# Patient Record
Sex: Female | Born: 1975 | Race: Black or African American | Hispanic: No | Marital: Single | Smoking: Former smoker
Health system: Southern US, Community
[De-identification: ages and names within clinical notes are randomized; demographics above are authoritative.]

## PROBLEM LIST (undated history)

## (undated) ENCOUNTER — Inpatient Hospital Stay (HOSPITAL_COMMUNITY): Payer: Self-pay

## (undated) DIAGNOSIS — IMO0002 Reserved for concepts with insufficient information to code with codable children: Secondary | ICD-10-CM

## (undated) DIAGNOSIS — Z789 Other specified health status: Secondary | ICD-10-CM

## (undated) DIAGNOSIS — R87629 Unspecified abnormal cytological findings in specimens from vagina: Secondary | ICD-10-CM

## (undated) DIAGNOSIS — N390 Urinary tract infection, site not specified: Secondary | ICD-10-CM

## (undated) DIAGNOSIS — R87619 Unspecified abnormal cytological findings in specimens from cervix uteri: Secondary | ICD-10-CM

## (undated) DIAGNOSIS — I10 Essential (primary) hypertension: Secondary | ICD-10-CM

## (undated) DIAGNOSIS — J45909 Unspecified asthma, uncomplicated: Secondary | ICD-10-CM

## (undated) HISTORY — PX: COLPOSCOPY: SHX161

## (undated) HISTORY — DX: Unspecified asthma, uncomplicated: J45.909

## (undated) HISTORY — DX: Unspecified abnormal cytological findings in specimens from vagina: R87.629

## (undated) HISTORY — DX: Essential (primary) hypertension: I10

---

## 1999-10-05 ENCOUNTER — Inpatient Hospital Stay (HOSPITAL_COMMUNITY): Admission: AD | Admit: 1999-10-05 | Discharge: 1999-10-05 | Payer: Self-pay | Admitting: Obstetrics & Gynecology

## 2000-10-19 ENCOUNTER — Inpatient Hospital Stay (HOSPITAL_COMMUNITY): Admission: AD | Admit: 2000-10-19 | Discharge: 2000-10-19 | Payer: Self-pay | Admitting: Obstetrics

## 2000-11-19 ENCOUNTER — Inpatient Hospital Stay (HOSPITAL_COMMUNITY): Admission: AD | Admit: 2000-11-19 | Discharge: 2000-11-19 | Payer: Self-pay | Admitting: Obstetrics

## 2000-11-25 ENCOUNTER — Encounter: Admission: RE | Admit: 2000-11-25 | Discharge: 2000-11-25 | Payer: Self-pay | Admitting: Obstetrics

## 2000-12-14 ENCOUNTER — Encounter: Admission: RE | Admit: 2000-12-14 | Discharge: 2000-12-14 | Payer: Self-pay | Admitting: Obstetrics & Gynecology

## 2001-02-08 ENCOUNTER — Encounter: Admission: RE | Admit: 2001-02-08 | Discharge: 2001-02-08 | Payer: Self-pay | Admitting: Obstetrics & Gynecology

## 2001-08-02 ENCOUNTER — Emergency Department (HOSPITAL_COMMUNITY): Admission: EM | Admit: 2001-08-02 | Discharge: 2001-08-03 | Payer: Self-pay

## 2002-11-03 ENCOUNTER — Emergency Department (HOSPITAL_COMMUNITY): Admission: EM | Admit: 2002-11-03 | Discharge: 2002-11-03 | Payer: Self-pay | Admitting: Emergency Medicine

## 2002-12-04 ENCOUNTER — Inpatient Hospital Stay (HOSPITAL_COMMUNITY): Admission: AD | Admit: 2002-12-04 | Discharge: 2002-12-04 | Payer: Self-pay | Admitting: *Deleted

## 2003-11-19 ENCOUNTER — Inpatient Hospital Stay (HOSPITAL_COMMUNITY): Admission: AD | Admit: 2003-11-19 | Discharge: 2003-11-19 | Payer: Self-pay | Admitting: Obstetrics and Gynecology

## 2004-09-30 ENCOUNTER — Emergency Department (HOSPITAL_COMMUNITY): Admission: EM | Admit: 2004-09-30 | Discharge: 2004-09-30 | Payer: Self-pay | Admitting: Emergency Medicine

## 2005-06-24 ENCOUNTER — Emergency Department (HOSPITAL_COMMUNITY): Admission: EM | Admit: 2005-06-24 | Discharge: 2005-06-24 | Payer: Self-pay | Admitting: Emergency Medicine

## 2005-07-01 ENCOUNTER — Emergency Department (HOSPITAL_COMMUNITY): Admission: EM | Admit: 2005-07-01 | Discharge: 2005-07-01 | Payer: Self-pay | Admitting: Emergency Medicine

## 2005-08-06 ENCOUNTER — Inpatient Hospital Stay (HOSPITAL_COMMUNITY): Admission: AD | Admit: 2005-08-06 | Discharge: 2005-08-06 | Payer: Self-pay | Admitting: *Deleted

## 2005-08-23 ENCOUNTER — Emergency Department (HOSPITAL_COMMUNITY): Admission: EM | Admit: 2005-08-23 | Discharge: 2005-08-24 | Payer: Self-pay | Admitting: Emergency Medicine

## 2005-11-25 ENCOUNTER — Emergency Department (HOSPITAL_COMMUNITY): Admission: EM | Admit: 2005-11-25 | Discharge: 2005-11-25 | Payer: Self-pay | Admitting: Emergency Medicine

## 2007-01-21 ENCOUNTER — Emergency Department (HOSPITAL_COMMUNITY): Admission: EM | Admit: 2007-01-21 | Discharge: 2007-01-21 | Payer: Self-pay | Admitting: Emergency Medicine

## 2007-05-07 ENCOUNTER — Emergency Department (HOSPITAL_COMMUNITY): Admission: EM | Admit: 2007-05-07 | Discharge: 2007-05-07 | Payer: Self-pay | Admitting: Emergency Medicine

## 2007-07-06 ENCOUNTER — Emergency Department (HOSPITAL_COMMUNITY): Admission: EM | Admit: 2007-07-06 | Discharge: 2007-07-06 | Payer: Self-pay | Admitting: Emergency Medicine

## 2008-02-10 ENCOUNTER — Emergency Department (HOSPITAL_COMMUNITY): Admission: EM | Admit: 2008-02-10 | Discharge: 2008-02-10 | Payer: Self-pay | Admitting: Emergency Medicine

## 2008-03-16 ENCOUNTER — Emergency Department (HOSPITAL_COMMUNITY): Admission: EM | Admit: 2008-03-16 | Discharge: 2008-03-16 | Payer: Self-pay | Admitting: General Surgery

## 2008-05-22 ENCOUNTER — Emergency Department (HOSPITAL_COMMUNITY): Admission: EM | Admit: 2008-05-22 | Discharge: 2008-05-22 | Payer: Self-pay | Admitting: Family Medicine

## 2008-06-13 ENCOUNTER — Emergency Department (HOSPITAL_COMMUNITY): Admission: EM | Admit: 2008-06-13 | Discharge: 2008-06-13 | Payer: Self-pay | Admitting: Emergency Medicine

## 2008-07-21 ENCOUNTER — Emergency Department (HOSPITAL_COMMUNITY): Admission: EM | Admit: 2008-07-21 | Discharge: 2008-07-21 | Payer: Self-pay | Admitting: Emergency Medicine

## 2008-09-09 ENCOUNTER — Emergency Department (HOSPITAL_COMMUNITY): Admission: EM | Admit: 2008-09-09 | Discharge: 2008-09-09 | Payer: Self-pay | Admitting: Emergency Medicine

## 2008-10-21 ENCOUNTER — Emergency Department (HOSPITAL_COMMUNITY): Admission: EM | Admit: 2008-10-21 | Discharge: 2008-10-21 | Payer: Self-pay | Admitting: Emergency Medicine

## 2008-12-02 ENCOUNTER — Emergency Department (HOSPITAL_COMMUNITY): Admission: EM | Admit: 2008-12-02 | Discharge: 2008-12-02 | Payer: Self-pay | Admitting: Emergency Medicine

## 2009-01-09 ENCOUNTER — Emergency Department (HOSPITAL_COMMUNITY): Admission: EM | Admit: 2009-01-09 | Discharge: 2009-01-09 | Payer: Self-pay | Admitting: Emergency Medicine

## 2009-04-24 ENCOUNTER — Emergency Department (HOSPITAL_COMMUNITY): Admission: EM | Admit: 2009-04-24 | Discharge: 2009-04-25 | Payer: Self-pay | Admitting: Emergency Medicine

## 2009-05-05 ENCOUNTER — Emergency Department (HOSPITAL_COMMUNITY): Admission: EM | Admit: 2009-05-05 | Discharge: 2009-05-05 | Payer: Self-pay | Admitting: Emergency Medicine

## 2010-09-12 ENCOUNTER — Emergency Department (HOSPITAL_COMMUNITY): Admission: EM | Admit: 2010-09-12 | Discharge: 2010-09-12 | Payer: Self-pay | Admitting: Family Medicine

## 2011-01-06 ENCOUNTER — Emergency Department (HOSPITAL_COMMUNITY)
Admission: EM | Admit: 2011-01-06 | Discharge: 2011-01-06 | Payer: Self-pay | Source: Home / Self Care | Admitting: Emergency Medicine

## 2011-04-07 LAB — RAPID STREP SCREEN (MED CTR MEBANE ONLY): Streptococcus, Group A Screen (Direct): NEGATIVE

## 2011-04-07 LAB — STREP A DNA PROBE

## 2011-12-16 ENCOUNTER — Emergency Department (HOSPITAL_COMMUNITY): Payer: Self-pay

## 2011-12-16 ENCOUNTER — Encounter: Payer: Self-pay | Admitting: *Deleted

## 2011-12-16 ENCOUNTER — Emergency Department (HOSPITAL_COMMUNITY)
Admission: EM | Admit: 2011-12-16 | Discharge: 2011-12-16 | Disposition: A | Discharge status: Home / Self Care | Payer: Self-pay | Attending: Emergency Medicine | Admitting: Emergency Medicine

## 2011-12-16 DIAGNOSIS — S39012A Strain of muscle, fascia and tendon of lower back, initial encounter: Secondary | ICD-10-CM

## 2011-12-16 DIAGNOSIS — F17200 Nicotine dependence, unspecified, uncomplicated: Secondary | ICD-10-CM | POA: Insufficient documentation

## 2011-12-16 DIAGNOSIS — M545 Low back pain, unspecified: Secondary | ICD-10-CM | POA: Insufficient documentation

## 2011-12-16 DIAGNOSIS — S335XXA Sprain of ligaments of lumbar spine, initial encounter: Secondary | ICD-10-CM | POA: Insufficient documentation

## 2011-12-16 MED ORDER — DIAZEPAM 5 MG PO TABS
5.0000 mg | ORAL_TABLET | Freq: Once | ORAL | Status: AC
  Administered 2011-12-16: 5 mg via ORAL
  Filled 2011-12-16: qty 1

## 2011-12-16 MED ORDER — TRAMADOL HCL 50 MG PO TABS: 50.0000 mg | ORAL_TABLET | Freq: Four times a day (QID) | ORAL | Status: AC | PRN

## 2011-12-16 MED ORDER — DIAZEPAM 5 MG PO TABS: 5.0000 mg | ORAL_TABLET | Freq: Three times a day (TID) | ORAL | Status: AC | PRN

## 2011-12-16 MED ORDER — IBUPROFEN 800 MG PO TABS: 800.0000 mg | ORAL_TABLET | Freq: Three times a day (TID) | ORAL | Status: AC

## 2011-12-16 MED ORDER — IBUPROFEN 800 MG PO TABS
800.0000 mg | ORAL_TABLET | Freq: Once | ORAL | Status: AC
  Administered 2011-12-16: 800 mg via ORAL
  Filled 2011-12-16: qty 1
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
## 2011-12-16 NOTE — ED Provider Notes (Signed)
Medical screening examination/treatment/procedure(s) were performed by non-physician practitioner and as supervising physician I was immediately available for consultation/collaboration.  Lisa-Marie Rueger P Deaven Barron, MD 12/16/11 1510 

## 2012-02-05 ENCOUNTER — Ambulatory Visit (INDEPENDENT_AMBULATORY_CARE_PROVIDER_SITE_OTHER): Payer: Managed Care, Other (non HMO) | Admitting: Surgery

## 2012-04-14 ENCOUNTER — Ambulatory Visit (INDEPENDENT_AMBULATORY_CARE_PROVIDER_SITE_OTHER): Payer: 59 | Admitting: General Surgery

## 2012-04-14 ENCOUNTER — Encounter (INDEPENDENT_AMBULATORY_CARE_PROVIDER_SITE_OTHER): Payer: Self-pay | Admitting: General Surgery

## 2012-04-14 VITALS — BP 132/90 | HR 84 | Temp 98.6°F | Resp 16 | Ht 63.0 in | Wt 136.6 lb

## 2012-04-14 DIAGNOSIS — D1779 Benign lipomatous neoplasm of other sites: Secondary | ICD-10-CM

## 2012-04-14 DIAGNOSIS — D1720 Benign lipomatous neoplasm of skin and subcutaneous tissue of unspecified limb: Secondary | ICD-10-CM

## 2012-04-14 NOTE — Patient Instructions (Signed)
Lipoma A lipoma is a noncancerous (benign) tumor composed of fat cells. They are usually found under the skin (subcutaneous). A lipoma may occur in any tissue of the body that contains fat. Common areas for lipomas to appear include the back, shoulders, buttocks, and thighs. Lipomas are a very common soft tissue growth. They are soft and grow slowly. Most problems caused by a lipoma depend on where it is growing. DIAGNOSIS  A lipoma can be diagnosed with a physical exam. These tumors rarely become cancerous, but radiographic studies can help determine this for certain. Studies used may include:  Computerized X-ray scans (CT or CAT scan).   Computerized magnetic scans (MRI).  TREATMENT  Small lipomas that are not causing problems may be watched. If a lipoma continues to enlarge or causes problems, removal is often the best treatment. Lipomas can also be removed to improve appearance. Surgery is done to remove the fatty cells and the surrounding capsule. Most often, this is done with medicine that numbs the area (local anesthetic). The removed tissue is examined under a microscope to make sure it is not cancerous. Keep all follow-up appointments with your caregiver. SEEK MEDICAL CARE IF:   The lipoma becomes larger or hard.   The lipoma becomes painful, red, or increasingly swollen. These could be signs of infection or a more serious condition.  Document Released: 12/04/2002 Document Revised: 12/03/2011 Document Reviewed: 05/16/2010 [employer] Patient Information 2012 [employer]. 

## 2012-04-14 NOTE — Progress Notes (Signed)
Chief Complaint  Patient presents with  . Lipoma    new pt- eval lipoma on right forearm    HISTORY:  36 year old African American female comes in complaining of a right proximal forearm subcutaneous mass. She states that she was told it was a lipoma. She states it has been there for several years. She believes that it has slowly increased in size over the past several years. She denies any trauma to the area. She denies any other lymphadenopathy or masses. She denies any fever, chills, night sweats, weight loss, chest pain, shortness of breath. She denies any redness or tenderness at the area. She denies any drainage from that area. She is interested in surgical excision since the area has gotten larger  Past Medical History  Diagnosis Date  . Lipoma     right forearm     History reviewed. No pertinent past surgical history.  Current Outpatient Prescriptions  Medication Sig Dispense Refill  . diphenhydramine-acetaminophen (TYLENOL PM) 25-500 MG TABS Take 1 tablet by mouth at bedtime as needed. For sleep          No Known Allergies   Family History  Problem Relation Age of Onset  . Hypertension Mother   . Hypertension Maternal Aunt      History   Social History  . Marital Status: Single    Spouse Name: N/A    Number of Children: N/A  . Years of Education: N/A   Social History Main Topics  . Smoking status: Current Everyday Smoker -- 0.2 packs/day  . Smokeless tobacco: None  . Alcohol Use: Yes  . Drug Use: No  . Sexually Active:    Other Topics Concern  . None   Social History Narrative  . None     10 POINT REVIEW OF SYSTEMS - PERTINENT POSITIVES ONLY: negative  EXAM: Filed Vitals:   04/14/12 1325  BP: 132/90  Pulse: 84  Temp: 98.6 F (37 C)  Resp: 16    GEN:  WD, WN AAF in NAD HEENT: normocephalic; pupils equal and reactive; sclerae clear; dentition good; mucous membranes moist NECK:  ; symmetric on extension; no palpable anterior or posterior  cervical lymphadenopathy; no supraclavicular masses; no tenderness CHEST: clear to auscultation bilaterally without rales, rhonchi, or wheezes CARDIAC: regular rate and rhythm without significant murmur; peripheral pulses are full EXT:  non-tender without edema; no deformity; symmetric strength; right proximal forearm - anterolateral just distal to elbow, well circumscribed 2 x2cm subcu mass, soft, nontender, mobile, no overlying skin lesion NEURO: no gross focal deficits; no sign of tremor ABD:  Soft, nontender LYMPH: No supraclavicular or axillary LAD SKIN:  Several scattered tattoos  LABORATORY RESULTS: See Epic for most recent results  RADIOLOGY RESULTS: See Epic or I-Site for most recent results  DATA REVIEWED: New pt form  IMPRESSION: Right proximal forearm lipoma   PLAN: We discussed the etiology of lipomas. This is most consistent with a lipoma. My suspicion for a malignancy is very low. We discussed observation versus surgical excision. We discussed the risks and benefits of surgery including but not limited to bleeding, infection, injury to surrounding structures, numbness, scarring, hematoma formation, seroma formation, need for potential additional procedures, the general postoperative recovery course, and anesthesia concerns. We also talked about the rare possibility of blood clot formation. I believe that we can do this under monitored anesthesia care.  She has elected to proceed to the operating room for excision of right proximal forearm subcutaneous mass  Mary Sella. Andrey Campanile,  MD, FACS General, Bariatric, & Minimally Invasive Surgery [facility]      Visit Diagnoses: 1. Lipoma of arm     Primary Care Physician: No primary provider on file.

## 2012-05-18 ENCOUNTER — Encounter (HOSPITAL_BASED_OUTPATIENT_CLINIC_OR_DEPARTMENT_OTHER): Payer: Self-pay | Admitting: *Deleted

## 2012-05-26 ENCOUNTER — Encounter (HOSPITAL_BASED_OUTPATIENT_CLINIC_OR_DEPARTMENT_OTHER)
Admission: RE | Disposition: A | Discharge status: Home / Self Care | Payer: Self-pay | Source: Ambulatory Visit | Attending: General Surgery

## 2012-05-26 ENCOUNTER — Encounter (HOSPITAL_BASED_OUTPATIENT_CLINIC_OR_DEPARTMENT_OTHER): Payer: Self-pay | Admitting: Anesthesiology

## 2012-05-26 ENCOUNTER — Encounter (HOSPITAL_BASED_OUTPATIENT_CLINIC_OR_DEPARTMENT_OTHER): Payer: Self-pay | Admitting: *Deleted

## 2012-05-26 ENCOUNTER — Ambulatory Visit (HOSPITAL_BASED_OUTPATIENT_CLINIC_OR_DEPARTMENT_OTHER)
Admission: RE | Admit: 2012-05-26 | Discharge: 2012-05-26 | Disposition: A | Discharge status: Home / Self Care | Payer: 59 | Source: Ambulatory Visit | Attending: General Surgery | Admitting: General Surgery

## 2012-05-26 ENCOUNTER — Ambulatory Visit (HOSPITAL_BASED_OUTPATIENT_CLINIC_OR_DEPARTMENT_OTHER): Payer: 59 | Admitting: Anesthesiology

## 2012-05-26 DIAGNOSIS — D1739 Benign lipomatous neoplasm of skin and subcutaneous tissue of other sites: Secondary | ICD-10-CM | POA: Insufficient documentation

## 2012-05-26 HISTORY — PX: LIPOMA EXCISION: SHX5283

## 2012-05-26 HISTORY — DX: Other specified health status: Z78.9

## 2012-05-26 SURGERY — EXCISION LIPOMA
Anesthesia: Monitor Anesthesia Care | Site: Arm Upper | Laterality: Right | Wound class: Clean

## 2012-05-26 MED ORDER — FENTANYL CITRATE 0.05 MG/ML IJ SOLN: 50.0000 ug | INTRAMUSCULAR | Status: DC | PRN

## 2012-05-26 MED ORDER — CEFAZOLIN SODIUM 1-5 GM-% IV SOLN
INTRAVENOUS | Status: DC | PRN
  Administered 2012-05-26: 1 g via INTRAVENOUS

## 2012-05-26 MED ORDER — PROPOFOL 10 MG/ML IV BOLUS
INTRAVENOUS | Status: DC | PRN
  Administered 2012-05-26: 20 mg via INTRAVENOUS
  Administered 2012-05-26 (×3): 40 mg via INTRAVENOUS

## 2012-05-26 MED ORDER — OXYCODONE-ACETAMINOPHEN 5-325 MG PO TABS: 1.0000 | ORAL_TABLET | ORAL | Status: AC | PRN

## 2012-05-26 MED ORDER — LORAZEPAM 2 MG/ML IJ SOLN: 1.0000 mg | Freq: Once | INTRAMUSCULAR | Status: DC | PRN

## 2012-05-26 MED ORDER — MIDAZOLAM HCL 2 MG/2ML IJ SOLN: 1.0000 mg | INTRAMUSCULAR | Status: DC | PRN

## 2012-05-26 MED ORDER — HYDROMORPHONE HCL PF 1 MG/ML IJ SOLN: 0.2500 mg | INTRAMUSCULAR | Status: DC | PRN

## 2012-05-26 MED ORDER — MIDAZOLAM HCL 5 MG/5ML IJ SOLN
INTRAMUSCULAR | Status: DC | PRN
  Administered 2012-05-26: 2 mg via INTRAVENOUS

## 2012-05-26 MED ORDER — LACTATED RINGERS IV SOLN
INTRAVENOUS | Status: DC
  Administered 2012-05-26: 07:00:00 via INTRAVENOUS

## 2012-05-26 MED ORDER — ONDANSETRON HCL 4 MG/2ML IJ SOLN
INTRAMUSCULAR | Status: DC | PRN
  Administered 2012-05-26: 4 mg via INTRAVENOUS

## 2012-05-26 MED ORDER — FENTANYL CITRATE 0.05 MG/ML IJ SOLN
INTRAMUSCULAR | Status: DC | PRN
  Administered 2012-05-26: 100 ug via INTRAVENOUS

## 2012-05-26 MED ORDER — BUPIVACAINE-EPINEPHRINE 0.25% -1:200000 IJ SOLN
INTRAMUSCULAR | Status: DC | PRN
  Administered 2012-05-26: 10 mL

## 2012-05-26 SURGICAL SUPPLY — 57 items
ADH SKN CLS APL DERMABOND .7 (GAUZE/BANDAGES/DRESSINGS) ×1
APL SKNCLS STERI-STRIP NONHPOA (GAUZE/BANDAGES/DRESSINGS) ×1
BENZOIN TINCTURE PRP APPL 2/3 (GAUZE/BANDAGES/DRESSINGS) ×1 IMPLANT
BLADE HEX COATED 2.75 (ELECTRODE) IMPLANT
BLADE SURG 15 STRL LF DISP TIS (BLADE) ×1 IMPLANT
BLADE SURG 15 STRL SS (BLADE) ×2
BLADE SURG ROTATE 9660 (MISCELLANEOUS) IMPLANT
CANISTER SUCTION 1200CC (MISCELLANEOUS) IMPLANT
CHLORAPREP W/TINT 26ML (MISCELLANEOUS) ×2 IMPLANT
COVER MAYO STAND STRL (DRAPES) ×2 IMPLANT
COVER TABLE BACK 60X90 (DRAPES) ×2 IMPLANT
DECANTER SPIKE VIAL GLASS SM (MISCELLANEOUS) IMPLANT
DERMABOND ADVANCED (GAUZE/BANDAGES/DRESSINGS) ×1
DERMABOND ADVANCED .7 DNX12 (GAUZE/BANDAGES/DRESSINGS) ×1 IMPLANT
DRAPE PED LAPAROTOMY (DRAPES) ×2 IMPLANT
DRAPE UTILITY XL STRL (DRAPES) ×2 IMPLANT
DRSG TEGADERM 2-3/8X2-3/4 SM (GAUZE/BANDAGES/DRESSINGS) ×1 IMPLANT
DRSG TEGADERM 4X4.75 (GAUZE/BANDAGES/DRESSINGS) IMPLANT
ELECT COATED BLADE 2.86 ST (ELECTRODE) IMPLANT
ELECT REM PT RETURN 9FT ADLT (ELECTROSURGICAL) ×2
ELECTRODE REM PT RTRN 9FT ADLT (ELECTROSURGICAL) ×1 IMPLANT
GAUZE PACKING IODOFORM 1/4X5 (PACKING) IMPLANT
GAUZE SPONGE 4X4 12PLY STRL LF (GAUZE/BANDAGES/DRESSINGS) IMPLANT
GLOVE BIO SURGEON STRL SZ7 (GLOVE) ×1 IMPLANT
GLOVE BIO SURGEON STRL SZ7.5 (GLOVE) ×3 IMPLANT
GLOVE BIOGEL PI IND STRL 7.0 (GLOVE) IMPLANT
GLOVE BIOGEL PI IND STRL 8 (GLOVE) ×1 IMPLANT
GLOVE BIOGEL PI INDICATOR 7.0 (GLOVE) ×1
GLOVE BIOGEL PI INDICATOR 8 (GLOVE) ×2
GOWN PREVENTION PLUS XLARGE (GOWN DISPOSABLE) ×1 IMPLANT
GOWN PREVENTION PLUS XXLARGE (GOWN DISPOSABLE) ×1 IMPLANT
NDL HYPO 25X1 1.5 SAFETY (NEEDLE) ×1 IMPLANT
NEEDLE HYPO 25X1 1.5 SAFETY (NEEDLE) ×2 IMPLANT
NS IRRIG 1000ML POUR BTL (IV SOLUTION) ×1 IMPLANT
PACK BASIN DAY SURGERY FS (CUSTOM PROCEDURE TRAY) ×2 IMPLANT
PENCIL BUTTON HOLSTER BLD 10FT (ELECTRODE) ×2 IMPLANT
SPONGE GAUZE 2X2 8PLY STRL LF (GAUZE/BANDAGES/DRESSINGS) ×1 IMPLANT
SPONGE GAUZE 4X4 12PLY (GAUZE/BANDAGES/DRESSINGS) IMPLANT
STRIP CLOSURE SKIN 1/2X4 (GAUZE/BANDAGES/DRESSINGS) ×1 IMPLANT
SUT ETHILON 2 0 FS 18 (SUTURE) IMPLANT
SUT ETHILON 4 0 PS 2 18 (SUTURE) IMPLANT
SUT MNCRL AB 4-0 PS2 18 (SUTURE) ×2 IMPLANT
SUT SILK 2 0 SH (SUTURE) IMPLANT
SUT VIC AB 2-0 SH 27 (SUTURE)
SUT VIC AB 2-0 SH 27XBRD (SUTURE) IMPLANT
SUT VIC AB 4-0 SH 18 (SUTURE) IMPLANT
SUT VICRYL 3-0 CR8 SH (SUTURE) IMPLANT
SUT VICRYL 4-0 PS2 18IN ABS (SUTURE) IMPLANT
SWAB CULTURE LIQ STUART DBL (MISCELLANEOUS) IMPLANT
SYR CONTROL 10ML LL (SYRINGE) ×2 IMPLANT
TOWEL OR 17X24 6PK STRL BLUE (TOWEL DISPOSABLE) ×1 IMPLANT
TOWEL OR NON WOVEN STRL DISP B (DISPOSABLE) ×2 IMPLANT
TUBE ANAEROBIC SPECIMEN COL (MISCELLANEOUS) IMPLANT
TUBE CONNECTING 20X1/4 (TUBING) IMPLANT
UNDERPAD 30X30 INCONTINENT (UNDERPADS AND DIAPERS) IMPLANT
WATER STERILE IRR 1000ML POUR (IV SOLUTION) ×1 IMPLANT
YANKAUER SUCT BULB TIP NO VENT (SUCTIONS) IMPLANT

## 2012-05-26 NOTE — Op Note (Signed)
05/26/2012  8:19 AM  PATIENT:  Maureen Simpson  36 y.o. female  PRE-OPERATIVE DIAGNOSIS:  right proximal forearm lipoma  POST-OPERATIVE DIAGNOSIS:  right proximal forearm lipoma  PROCEDURE:  Procedure(s): EXCISION OF RIGHT FOREARM LIPOMA  SURGEON:  Surgeon(s): Atilano Ina, MD,FACS  ASSISTANTS: none   ANESTHESIA:   MAC  DRAINS: none   LOCAL MEDICATIONS USED:  MARCAINE     SPECIMEN:  Source of Specimen:  right forearm lipoma  DISPOSITION OF SPECIMEN:  PATHOLOGY  COUNTS:  YES  INDICATION FOR PROCEDURE: The patient is a 36 year old African American female who has had a mass on her right forearm for several years. She is now interested in having it surgically excised. We discussed the risk and benefits of surgery including but not limited to bleeding, infection, scarring, hematoma formation, seroma formation, injury to surrounding structures, potential need for additional procedures, anesthesia complications, and the typical postoperative course. She elected to proceed to the operating room  PROCEDURE: After marking the site in the holding area and obtaining consent the patient was taken back to operating room 8 at the [facility] and placed supine on the operating room table. Sequential compression devices were placed. Monitored anesthesia care was established. Her right arm was prepped and draped in the usual standard surgical fashion with ChloraPrep. She received IV antibiotics prior to skin incision. A surgical timeout was performed. The mass measured roughly 2 cm x 2 cm. I Drew a 3 cm incision over it. I then infiltrated local consisting of quarter percent Marcaine with epinephrine in the area. I then made a 3 cm incision with a #15 blade. Then using tenotomy scissors I separated the skin and the subcutaneous tissue away from the lipoma. I then switched to electrocautery. For the most part the mass was easily excised. It had a little bit of tension to the underlying  muscle fascia. I freed it sharply from the underlying muscle fascia. It was excised in its entirety. The wound was irrigated and hemostasis was achieved. I infiltrated some additional local. I then closed the skin with a running 4-0 Monocryl in a subcuticular fashion. Benzoin, Steri-Strips, a 2 x 2 gauze and a Tegaderm were then applied. The patient tolerated the procedure well. All needle and instrument counts were correct x2. She was taken to the recovery in stable condition  PLAN OF CARE: Discharge to home after PACU  PATIENT DISPOSITION:  PACU - hemodynamically stable.   Delay start of Pharmacological VTE agent (>24hrs) due to surgical blood loss or risk of bleeding:  not applicable   Mary Sella. Andrey Campanile, MD, FACS General, Bariatric, & Minimally Invasive Surgery [facility], [place]

## 2012-05-26 NOTE — H&P (Signed)
Maureen Simpson is an 36 y.o. female.   Chief Complaint: here for surgery HPI: 36 year old African American female comes in for surgery after presenting to the office complaining of a right proximal forearm subcutaneous mass. She states that she was told it was a lipoma. She states it has been there for several years. She believes that it has slowly increased in size over the past several years. She denies any trauma to the area. She denies any other lymphadenopathy or masses. She denies any fever, chills, night sweats, weight loss, chest pain, shortness of breath. She denies any redness or tenderness at the area. She denies any drainage from that area. She is interested in surgical excision since the area has gotten larger   Past Medical History  Diagnosis Date  . Lipoma     right forearm  . No pertinent past medical history     History reviewed. No pertinent past surgical history.  Family History  Problem Relation Age of Onset  . Hypertension Mother   . Hypertension Maternal Aunt    Social History:  reports that she has been smoking.  She does not have any smokeless tobacco history on file. She reports that she drinks alcohol. She reports that she does not use illicit drugs.  Allergies: No Known Allergies  Medications Prior to Admission  Medication Sig Dispense Refill  . diphenhydramine-acetaminophen (TYLENOL PM) 25-500 MG TABS Take 1 tablet by mouth at bedtime as needed. For sleep         No results found for this or any previous visit (from the past 48 hour(s)). No results found.  Review of Systems  Constitutional: Negative for fever, chills, weight loss and malaise/fatigue.  Endo/Heme/Allergies: Does not bruise/bleed easily.  All other systems reviewed and are negative.    Blood pressure 130/82, pulse 71, temperature 98.2 F (36.8 C), temperature source Oral, resp. rate 16, height 5\' 3"  (1.6 m), weight 136 lb (61.689 kg), last menstrual period 05/11/2012, SpO2  100.00%. Physical Exam  Vitals reviewed. Constitutional: She is oriented to person, place, and time. She appears well-developed and well-nourished. No distress.  HENT:  Head: Normocephalic and atraumatic.  Right Ear: External ear normal.  Left Ear: External ear normal.  Eyes: Conjunctivae are normal. No scleral icterus.  Neck: Neck supple. No tracheal deviation present.  Cardiovascular: Normal rate and intact distal pulses.   Respiratory: Effort normal. No respiratory distress.  Neurological: She is alert and oriented to person, place, and time.  Skin: Skin is warm and dry. No rash noted. She is not diaphoretic. No erythema.       Right proximal forearm subcu mass 2 x 2cm well circumscribed. mobile  Psychiatric: She has a normal mood and affect. Her behavior is normal. Judgment and thought content normal.     Assessment/Plan Right forearm lipoma  To OR for excision. All questions asked and answered.   Mary Sella. Andrey Campanile, MD, FACS General, Bariatric, & Minimally Invasive Surgery [facility], [place]   [facility] M 05/26/2012, 7:14 AM

## 2012-05-26 NOTE — Discharge Instructions (Signed)
[facility] Office Phone Number [phone]  POST OP INSTRUCTIONS  Always review your discharge instruction sheet given to you by the facility where your surgery was performed.  IF YOU HAVE DISABILITY OR FAMILY LEAVE FORMS, YOU MUST BRING THEM TO THE OFFICE FOR PROCESSING.  DO NOT GIVE THEM TO YOUR DOCTOR.  1. A prescription for pain medication may be given to you upon discharge.  Take your pain medication as prescribed, if needed.  If narcotic pain medicine is not needed, then you may take acetaminophen (Tylenol) or ibuprofen (Advil) as needed. 2. Take your usually prescribed medications unless otherwise directed 3. If you need a refill on your pain medication, please contact your pharmacy.  They will contact our office to request authorization.  Prescriptions will not be filled after 5pm or on week-ends. 4. You should eat very light the first 24 hours after surgery, such as soup, crackers, pudding, etc.  Resume your normal diet the day after surgery. 5. Most patients will experience some swelling and bruising in the arm.  Ice packs and elevation will help.  Swelling and bruising can take several days to resolve.  6. It is common to experience some constipation if taking pain medication after surgery.  Increasing fluid intake and taking a stool softener will usually help or prevent this problem from occurring.  A mild laxative (Milk of Magnesia or Miralax) should be taken according to package directions if there are no bowel movements after 48 hours. 7. Unless discharge instructions indicate otherwise, you may remove your bandages (clear tape and gauze) 48 hours after surgery, and you may shower at that time.  You  have steri-strips (small skin tapes) in place directly over the incision.  These strips should be left on the skin for 7-10 days.   8. ACTIVITIES:  You may resume regular daily activities (gradually increasing) beginning the next day.   You may have sexual intercourse  when it is comfortable. a. You may drive when you no longer are taking prescription pain medication, you can comfortably wear a seatbelt, and you can safely maneuver your car and apply brakes. b. RETURN TO WORK:  1-2 days 9. You should see your doctor in the office for a follow-up appointment approximately two weeks after your surgery.  Your doctor's nurse will typically make your follow-up appointment when she calls you with your pathology report.  Expect your pathology report 2-3 business days after your surgery.  You may call to check if you do not hear from [place] after three days. OTHER INSTRUCTIONS:  WHEN TO CALL YOUR DOCTOR: 1. Fever over 101.0 2. Nausea and/or vomiting. 3. Extreme swelling or bruising. 4. Continued bleeding from incision. 5. Increased pain, redness, or drainage from the incision.  The clinic staff is available to answer your questions during regular business hours.  Please don't hesitate to call and ask to speak to one of the nurses for clinical concerns.  If you have a medical emergency, go to the nearest emergency room or call 911.  A surgeon from [facility] is always on call at the hospital.  For further questions, please visit [email or web address]      Post Anesthesia Home Care Instructions   Activity: Get plenty of rest for the remainder of the day. A responsible adult should stay with you for 24 hours following the procedure.  For the next 24 hours, DO NOT: -Drive a car -Advertising copywriter -Drink alcoholic beverages -Take any medication unless instructed by your physician -Make  any legal decisions or sign important papers.  Meals: Start with liquid foods such as gelatin or soup. Progress to regular foods as tolerated. Avoid greasy, spicy, heavy foods. If nausea and/or vomiting occur, drink only clear liquids until the nausea and/or vomiting subsides. Call your physician if vomiting continues.  Special Instructions/Symptoms: Your throat  may feel dry or sore from the anesthesia or the breathing tube placed in your throat during surgery. If this causes discomfort, gargle with warm salt water. The discomfort should disappear within 24 hours.

## 2012-05-26 NOTE — Anesthesia Postprocedure Evaluation (Signed)
  Anesthesia Post-op Note  Patient: Maureen Simpson  Procedure(s) Performed: Procedure(s) (LRB): EXCISION LIPOMA (Right)  Patient Location: PACU  Anesthesia Type: MAC  Level of Consciousness: awake  Airway and Oxygen Therapy: Patient Spontanous Breathing  Post-op Pain: mild  Post-op Assessment: Post-op Vital signs reviewed, Patient's Cardiovascular Status Stable, Respiratory Function Stable, Patent Airway, No signs of Nausea or vomiting, Adequate PO intake and Pain level controlled  Post-op Vital Signs: stable  Complications: No apparent anesthesia complications

## 2012-05-26 NOTE — Anesthesia Preprocedure Evaluation (Signed)
Anesthesia Evaluation  Patient identified by MRN, date of birth, ID band Patient awake    Reviewed: Allergy & Precautions, H&P , NPO status , Patient's Chart, lab work & pertinent test results  Airway Mallampati: I TM Distance: >3 FB Neck ROM: Full    Dental   Pulmonary Current Smoker,    Pulmonary exam normal       Cardiovascular     Neuro/Psych    GI/Hepatic   Endo/Other    Renal/GU      Musculoskeletal   Abdominal   Peds  Hematology   Anesthesia Other Findings   Reproductive/Obstetrics                           Anesthesia Physical Anesthesia Plan  ASA: II  Anesthesia Plan: MAC   Post-op Pain Management:    Induction: Intravenous  Airway Management Planned: Simple Face Mask  Additional Equipment:   Intra-op Plan:   Post-operative Plan:   Informed Consent: I have reviewed the patients History and Physical, chart, labs and discussed the procedure including the risks, benefits and alternatives for the proposed anesthesia with the patient or authorized representative who has indicated his/her understanding and acceptance.     Plan Discussed with: CRNA and Surgeon  Anesthesia Plan Comments:         Anesthesia Quick Evaluation

## 2012-05-26 NOTE — Anesthesia Procedure Notes (Signed)
Procedure Name: MAC Date/Time: 05/26/2012 7:35 AM Performed by: Zenia Resides D Pre-anesthesia Checklist: Patient identified, Emergency Drugs available, Suction available, Patient being monitored and Timeout performed Patient Re-evaluated:Patient Re-evaluated prior to inductionOxygen Delivery Method: Simple face mask Placement Confirmation: positive ETCO2 and CO2 detector

## 2012-05-26 NOTE — Transfer of Care (Signed)
Immediate Anesthesia Transfer of Care Note  Patient: Maureen Simpson  Procedure(s) Performed: Procedure(s) (LRB): EXCISION LIPOMA (Right)  Patient Location: PACU  Anesthesia Type: MAC  Level of Consciousness: awake and oriented  Airway & Oxygen Therapy: Patient Spontanous Breathing and Patient connected to face mask oxygen  Post-op Assessment: Report given to PACU RN and Post -op Vital signs reviewed and stable  Post vital signs: Reviewed and stable  Complications: No apparent anesthesia complications

## 2012-05-27 ENCOUNTER — Encounter (HOSPITAL_BASED_OUTPATIENT_CLINIC_OR_DEPARTMENT_OTHER): Payer: Self-pay | Admitting: General Surgery

## 2012-06-15 ENCOUNTER — Encounter (INDEPENDENT_AMBULATORY_CARE_PROVIDER_SITE_OTHER): Payer: 59 | Admitting: General Surgery

## 2012-06-21 ENCOUNTER — Encounter (INDEPENDENT_AMBULATORY_CARE_PROVIDER_SITE_OTHER): Payer: Self-pay | Admitting: General Surgery

## 2012-12-28 NOTE — L&D Delivery Note (Signed)
Delivery Note  At 11:43 AM a viable female was delivered via Vaginal, Spontaneous Delivery (Presentation: Right Occiput Anterior with shoulder dystocia). APGAR: 3 at 1 min, 6 at 5 min; weight TBD.  Placenta status: Spontaneous, central insertion with some calicifications. Cord: 3VC, normal length with the following complications: . Cord pH: 7.11  Anesthesia: Epidural  Episiotomy: None  Lacerations: None  Suture Repair: N/A  Est. Blood Loss (mL): 500  Mom to postpartum. Baby to NICU.  Deirdre Christy Gentles, CNM was present throughout and assisted with delivery.  [name]  11/16/2013, 12:11 PM   Addendum: NN team present immediately after delivery.  FHR reassuring through 2nd stage. Vtx OA with significant molding and caput, without spontaneous restitution. Dr. Jarvis Newcomer checked for nuchal cord which was not present and tried usual downward traction without effect. I then proceeded with exam noting shoulders in transverse. Two nurses did McRobert's. Attempted traction with maternal effort. Rotated anterior shoulder to oblique with right shoulder clockwise. Again attempted traction with and without suprapubic pressure. Attemped further clockwise rotation. Rt shoulder below symphysis and attempted downward and upward traction with gentle grip rt axilla. Felt for posterior arm- not reached. Delivered ant arm across chest and body delivered without difficulty. Shoulder dystocia 3 min duration per RN. Cord pHa 7.11. Baby to NICU.   [name], CNM  11/16/2013  1:38 PM

## 2013-03-28 ENCOUNTER — Inpatient Hospital Stay (HOSPITAL_COMMUNITY): Payer: Medicaid Other

## 2013-03-28 ENCOUNTER — Inpatient Hospital Stay (HOSPITAL_COMMUNITY)
Admission: AD | Admit: 2013-03-28 | Discharge: 2013-03-28 | Disposition: A | Discharge status: Home / Self Care | Payer: Medicaid Other | Source: Ambulatory Visit | Attending: Obstetrics & Gynecology | Admitting: Obstetrics & Gynecology

## 2013-03-28 ENCOUNTER — Encounter (HOSPITAL_COMMUNITY): Payer: Self-pay | Admitting: *Deleted

## 2013-03-28 DIAGNOSIS — O99891 Other specified diseases and conditions complicating pregnancy: Secondary | ICD-10-CM | POA: Insufficient documentation

## 2013-03-28 DIAGNOSIS — Z3490 Encounter for supervision of normal pregnancy, unspecified, unspecified trimester: Secondary | ICD-10-CM

## 2013-03-28 DIAGNOSIS — M545 Low back pain, unspecified: Secondary | ICD-10-CM | POA: Insufficient documentation

## 2013-03-28 DIAGNOSIS — Z1389 Encounter for screening for other disorder: Secondary | ICD-10-CM

## 2013-03-28 LAB — URINALYSIS, ROUTINE W REFLEX MICROSCOPIC
Glucose, UA: NEGATIVE mg/dL
Nitrite: NEGATIVE
Specific Gravity, Urine: 1.02 (ref 1.005–1.030)
pH: 7 (ref 5.0–8.0)

## 2013-03-28 LAB — WET PREP, GENITAL

## 2013-03-28 LAB — CBC
MCH: 31.4 pg (ref 26.0–34.0)
MCV: 90.7 fL (ref 78.0–100.0)
Platelets: 288 10*3/uL (ref 150–400)
RDW: 12.7 % (ref 11.5–15.5)
WBC: 11.4 10*3/uL — ABNORMAL HIGH (ref 4.0–10.5)

## 2013-03-28 LAB — URINE MICROSCOPIC-ADD ON

## 2013-03-28 LAB — POCT PREGNANCY, URINE: Preg Test, Ur: POSITIVE — AB

## 2013-03-28 NOTE — MAU Note (Signed)
Pt notified of positive preg test and need for further testing.

## 2013-03-28 NOTE — MAU Provider Note (Signed)
History     CSN: [id number]  Arrival date and time: 03/28/13 1734   First Provider Initiated Contact with Patient 03/28/13 2224      Chief Complaint  Patient presents with  . Urinary Tract Infection   HPI Maureen Simpson 37 y.o. Client comes to MAU with midline back pain on the right side thinking she has a UTI.  Has yellow urine in the AM which has an odor.  Denies dysuria.  Pregnancy test was positive.  Client is extremely surprised and stunned.  OB History   Grav Para Term Preterm Abortions TAB SAB Ect Mult Living   1               Past Medical History  Diagnosis Date  . Lipoma     right forearm  . No pertinent past medical history     Past Surgical History  Procedure Laterality Date  . Lipoma excision  05/26/2012    Procedure: EXCISION LIPOMA;  Surgeon: Atilano Ina, MD,FACS;  Location: [facility];  Service: General;  Laterality: Right;  excision of right proximal forearm lipoma    Family History  Problem Relation Age of Onset  . Hypertension Mother   . Hypertension Maternal Aunt     History  Substance Use Topics  . Smoking status: Current Every Day Smoker -- 0.25 packs/day  . Smokeless tobacco: Not on file  . Alcohol Use: No     Comment: SOCIAL    Allergies: No Known Allergies  No prescriptions prior to admission    Review of Systems  Constitutional: Negative for fever.  Gastrointestinal: Negative for nausea, vomiting, abdominal pain, diarrhea and constipation.  Genitourinary:       No vaginal discharge. No vaginal bleeding. No dysuria.   Physical Exam   Blood pressure 151/78, pulse 73, temperature 98 F (36.7 C), temperature source Oral, resp. rate 16, height 5\' 3"  (1.6 m), weight 144 lb 8 oz (65.545 kg), last menstrual period 02/21/2013.  Physical Exam  Nursing note and vitals reviewed. Constitutional: She is oriented to person, place, and time. She appears well-developed and well-nourished.  HENT:  Head: Normocephalic.   Eyes: EOM are normal.  Neck: Neck supple.  GI: Soft. There is no tenderness.  Genitourinary:  Speculum exam: Vagina - Small amount of creamy, frothy discharge, no odor Cervix - No contact bleeding Bimanual exam: Cervix closed Uterus 8 week size Adnexa non tender, no masses bilaterally GC/Chlam, wet prep done Chaperone present for exam.  Musculoskeletal: Normal range of motion.  Neurological: She is alert and oriented to person, place, and time.  Skin: Skin is warm and dry.  Psychiatric: She has a normal mood and affect.    MAU Course  Procedures Results for orders placed during the hospital encounter of 03/28/13 (from the past 24 hour(s))  URINALYSIS, ROUTINE W REFLEX MICROSCOPIC     Status: Abnormal   Collection Time    03/28/13  6:32 PM      Result Value Range   Color, Urine YELLOW  YELLOW   APPearance CLOUDY (*) CLEAR   Specific Gravity, Urine 1.020  1.005 - 1.030   pH 7.0  5.0 - 8.0   Glucose, UA NEGATIVE  NEGATIVE mg/dL   Hgb urine dipstick NEGATIVE  NEGATIVE   Bilirubin Urine NEGATIVE  NEGATIVE   Ketones, ur NEGATIVE  NEGATIVE mg/dL   Protein, ur NEGATIVE  NEGATIVE mg/dL   Urobilinogen, UA 2.0 (*) 0.0 - 1.0 mg/dL   Nitrite NEGATIVE  NEGATIVE   Leukocytes, UA SMALL (*) NEGATIVE  URINE MICROSCOPIC-ADD ON     Status: Abnormal   Collection Time    03/28/13  6:32 PM      Result Value Range   Squamous Epithelial / LPF MANY (*) RARE   WBC, UA 3-6  <3 WBC/hpf   Bacteria, UA FEW (*) RARE  POCT PREGNANCY, URINE     Status: Abnormal   Collection Time    03/28/13  6:37 PM      Result Value Range   Preg Test, Ur POSITIVE (*) NEGATIVE  CBC     Status: Abnormal   Collection Time    03/28/13  8:30 PM      Result Value Range   WBC 11.4 (*) 4.0 - 10.5 K/uL   RBC 3.76 (*) 3.87 - 5.11 MIL/uL   Hemoglobin 11.8 (*) 12.0 - 15.0 g/dL   HCT 09.8 (*) 11.9 - 14.7 %   MCV 90.7  78.0 - 100.0 fL   MCH 31.4  26.0 - 34.0 pg   MCHC 34.6  30.0 - 36.0 g/dL   RDW 82.9  56.2 - 13.0 %    Platelets 288  150 - 400 K/uL  HCG, QUANTITATIVE, PREGNANCY     Status: Abnormal   Collection Time    03/28/13  8:30 PM      Result Value Range   hCG, Beta Maureen Simpson [id number] (*) <5 mIU/mL  ABO/RH     Status: None   Collection Time    03/28/13  8:30 PM      Result Value Range   ABO/RH(D) A POS    WET PREP, GENITAL     Status: Abnormal   Collection Time    03/28/13 10:25 PM      Result Value Range   Yeast Wet Prep HPF POC NONE SEEN  NONE SEEN   Trich, Wet Prep NONE SEEN  NONE SEEN   Clue Cells Wet Prep HPF POC FEW (*) NONE SEEN   WBC, Wet Prep HPF POC FEW (*) NONE SEEN   MDM OBSTETRIC <14 WK [place] AND TRANSVAGINAL OB US  Technique: Both transabdominal and transvaginal ultrasound  examinations were performed for complete evaluation of the  gestation as well as the maternal uterus, adnexal regions, and  pelvic cul-de-sac. Transvaginal technique was performed to assess  early pregnancy.  Comparison: None.  Intrauterine gestational sac: Visualized/normal in shape.  Yolk sac: Present  Embryo: Present  Cardiac Activity: Present  CRL: 14.4 mm 7 w 6 d [place] EDC: 11/08/2013  Maternal uterus/adnexae:  No subchronic hemorrhage.  Bilateral ovaries are not discretely visualized.  No free fluid.  IMPRESSION:  Single live intrauterine gestation with estimated gestational age [redacted]  weeks 6 days by crown-rump length.    Assessment and Plan  IUP    Plan Urine culture pending Drink at least 8 8-oz glasses of water every day. Take Tylenol 325 mg 2 tablets by mouth every 4 hours if needed for pain. Your pregnancy test is positive.  No smoking, no drugs, no alcohol.  Take a prenatal vitamin one by mouth every day.  Eat small frequent snacks to avoid nausea.  Begin prenatal care as soon as possible.  Maureen Simpson 03/28/2013, 10:32 PM

## 2013-03-28 NOTE — MAU Note (Signed)
Pt states has foul smelling urine in the am x1 month, very dark in color. Also has frequency and urgency with voiding. No burning. LMP-02/21/2013 roughly. Notes white thick vaginal discharge with odor noted. D/C x1 month.

## 2013-03-28 NOTE — MAU Note (Signed)
Notes flank pain intermittently, none present now.

## 2013-03-29 LAB — GC/CHLAMYDIA PROBE AMP: CT Probe RNA: NEGATIVE

## 2013-03-30 ENCOUNTER — Telehealth: Payer: Self-pay | Admitting: General Practice

## 2013-03-30 DIAGNOSIS — N390 Urinary tract infection, site not specified: Secondary | ICD-10-CM

## 2013-03-30 MED ORDER — CEPHALEXIN 500 MG PO CAPS: 500.0000 mg | ORAL_CAPSULE | Freq: Four times a day (QID) | ORAL | Status: DC

## 2013-03-30 NOTE — Telephone Encounter (Signed)
Called patient, no answer- left message informing her of UTI from most recent urine culture and that an antibiotic is available for her to pick up at [facility] on [address] and to please give [place] a call back should she have questions or concerns

## 2013-03-30 NOTE — Telephone Encounter (Signed)
Message copied by [facility], Laury Axon on Thu Mar 30, 2013 10:46 AM ------      Message from: Nicholaus Bloom C      Created: Thu Mar 30, 2013  9:51 AM       She needs a prescription for keflex 500 mg QID for 7 days for a UTI. Thanks ------

## 2013-03-31 LAB — URINE CULTURE

## 2013-05-03 ENCOUNTER — Other Ambulatory Visit: Payer: Self-pay | Admitting: Obstetrics and Gynecology

## 2013-05-03 ENCOUNTER — Other Ambulatory Visit: Payer: Self-pay | Admitting: Family Medicine

## 2013-05-03 ENCOUNTER — Ambulatory Visit (INDEPENDENT_AMBULATORY_CARE_PROVIDER_SITE_OTHER): Payer: Self-pay | Admitting: Family Medicine

## 2013-05-03 ENCOUNTER — Encounter: Payer: Self-pay | Admitting: Family Medicine

## 2013-05-03 DIAGNOSIS — IMO0002 Reserved for concepts with insufficient information to code with codable children: Secondary | ICD-10-CM | POA: Insufficient documentation

## 2013-05-03 DIAGNOSIS — Z3490 Encounter for supervision of normal pregnancy, unspecified, unspecified trimester: Secondary | ICD-10-CM | POA: Insufficient documentation

## 2013-05-03 DIAGNOSIS — O09529 Supervision of elderly multigravida, unspecified trimester: Secondary | ICD-10-CM

## 2013-05-03 DIAGNOSIS — Z3682 Encounter for antenatal screening for nuchal translucency: Secondary | ICD-10-CM

## 2013-05-03 DIAGNOSIS — Z3492 Encounter for supervision of normal pregnancy, unspecified, second trimester: Secondary | ICD-10-CM

## 2013-05-03 LAB — POCT URINALYSIS DIP (DEVICE)
Protein, ur: NEGATIVE mg/dL
Specific Gravity, Urine: 1.02 (ref 1.005–1.030)
Urobilinogen, UA: 1 mg/dL (ref 0.0–1.0)
pH: 6.5 (ref 5.0–8.0)

## 2013-05-03 MED ORDER — PRENATAL VITAMINS 0.8 MG PO TABS: 1.0000 | ORAL_TABLET | Freq: Every day | ORAL | Status: DC

## 2013-05-03 NOTE — Progress Notes (Signed)
Pulse- 86  Weight gain 25-35lb New ob packet given  Declined flu vaccine

## 2013-05-03 NOTE — Progress Notes (Signed)
Subjective:    Maureen Simpson is being seen today for her first obstetrical visit.  This is not a planned pregnancy. She is at [redacted]w[redacted]d gestation by first trimester ultrasound not consistent with LMP. Her obstetrical history is significant for advanced maternal age. Relationship with FOB: significant other, living together. Patient does not intend to breast feed. Pregnancy history fully reviewed. First pregnancy.  Denies bleeding, cramping, fever, chills. Has had some nausea/vomiting but none today. No diarrhea or constipation. No headache, vision changes, dyspnea. Last pap at [facility] a few years ago, normal.  No abnl paps in past. No history of STDs or HSV.   Menstrual History: OB History   Grav Para Term Preterm Abortions TAB SAB Ect Mult Living   1               Patient's last menstrual period was 02/21/2013.   I have reviewed past medical and surgical history, OB history, medications, allergies, imaging, labs, family and social history.   Review of Systems Pertinent pos and negatives listed in HPI.   Objective:   GEN:  WNWD, no distress HEENT:  NCAT, EOMI, conjunctiva clear NECK:  Supple, non-tender, no thyromegaly, trachea midline CV: RRR, no murmur RESP:  CTAB ABD:  Soft, non-tender, no guarding or rebound, normal bowel sounds EXTREM:  Warm, well perfused, no edema or tenderness NEURO:  Alert, oriented, no focal deficits GU:  Normal external genitalia, normal vagina, normal non-parous cervix, minimal discharge. No CMT, No adnexal tenderness.   Assessment:    Pregnancy at 13w 0d Advanced Maternal Age    Plan:    Initial labs drawn. Prenatal vitamins. Problem list reviewed and updated. Genetic testing: early screen with NT ordered - referral to MFM, AFP at 16-18 wk Role of ultrasound in pregnancy discussed; fetal survey: requested - will order at 1820 weeks Amniocentesis discussed: not indicated Follow up in 4 weeks. 50% of 45 min visit spent on  counseling and coordination of care.

## 2013-05-03 NOTE — Patient Instructions (Addendum)
Pregnancy - Second Trimester The second trimester of pregnancy (3 to 6 months) is a period of rapid growth for you and your baby. At the end of the sixth month, your baby is about 9 inches long and weighs 1 1/2 pounds. You will begin to feel the baby move between 18 and 20 weeks of the pregnancy. This is called quickening. Weight gain is faster. A clear fluid (colostrum) may leak out of your breasts. You may feel small contractions of the womb (uterus). This is known as false labor or Braxton-Hicks contractions. This is like a practice for labor when the baby is ready to be born. Usually, the problems with morning sickness have usually passed by the end of your first trimester. Some women develop small dark blotches (called cholasma, mask of pregnancy) on their face that usually goes away after the baby is born. Exposure to the sun makes the blotches worse. Acne may also develop in some pregnant women and pregnant women who have acne, may find that it goes away. PRENATAL EXAMS  Blood work may continue to be done during prenatal exams. These tests are done to check on your health and the probable health of your baby. Blood work is used to follow your blood levels (hemoglobin). Anemia (low hemoglobin) is common during pregnancy. Iron and vitamins are given to help prevent this. You will also be checked for diabetes between 24 and 28 weeks of the pregnancy. Some of the previous blood tests may be repeated.  The size of the uterus is measured during each visit. This is to make sure that the baby is continuing to grow properly according to the dates of the pregnancy.  Your blood pressure is checked every prenatal visit. This is to make sure you are not getting toxemia.  Your urine is checked to make sure you do not have an infection, diabetes or protein in the urine.  Your weight is checked often to make sure gains are happening at the suggested rate. This is to ensure that both you and your baby are  growing normally.  Sometimes, an ultrasound is performed to confirm the proper growth and development of the baby. This is a test which bounces harmless sound waves off the baby so your caregiver can more accurately determine due dates. Sometimes, a specialized test is done on the amniotic fluid surrounding the baby. This test is called an amniocentesis. The amniotic fluid is obtained by sticking a needle into the belly (abdomen). This is done to check the chromosomes in instances where there is a concern about possible genetic problems with the baby. It is also sometimes done near the end of pregnancy if an early delivery is required. In this case, it is done to help make sure the baby's lungs are mature enough for the baby to live outside of the womb. CHANGES OCCURING IN THE SECOND TRIMESTER OF PREGNANCY Your body goes through many changes during pregnancy. They vary from person to person. Talk to your caregiver about changes you notice that you are concerned about.  During the second trimester, you will likely have an increase in your appetite. It is normal to have cravings for certain foods. This varies from person to person and pregnancy to pregnancy.  Your lower abdomen will begin to bulge.  You may have to urinate more often because the uterus and baby are pressing on your bladder. It is also common to get more bladder infections during pregnancy (pain with urination). You can help this by   drinking lots of fluids and emptying your bladder before and after intercourse.  You may begin to get stretch marks on your hips, abdomen, and breasts. These are normal changes in the body during pregnancy. There are no exercises or medications to take that prevent this change.  You may begin to develop swollen and bulging veins (varicose veins) in your legs. Wearing support hose, elevating your feet for 15 minutes, 3 to 4 times a day and limiting salt in your diet helps lessen the problem.  Heartburn may  develop as the uterus grows and pushes up against the stomach. Antacids recommended by your caregiver helps with this problem. Also, eating smaller meals 4 to 5 times a day helps.  Constipation can be treated with a stool softener or adding bulk to your diet. Drinking lots of fluids, vegetables, fruits, and whole grains are helpful.  Exercising is also helpful. If you have been very active up until your pregnancy, most of these activities can be continued during your pregnancy. If you have been less active, it is helpful to start an exercise program such as walking.  Hemorrhoids (varicose veins in the rectum) may develop at the end of the second trimester. Warm sitz baths and hemorrhoid cream recommended by your caregiver helps hemorrhoid problems.  Backaches may develop during this time of your pregnancy. Avoid heavy lifting, wear low heal shoes and practice good posture to help with backache problems.  Some pregnant women develop tingling and numbness of their hand and fingers because of swelling and tightening of ligaments in the wrist (carpel tunnel syndrome). This goes away after the baby is born.  As your breasts enlarge, you may have to get a bigger bra. Get a comfortable, cotton, support bra. Do not get a nursing bra until the last month of the pregnancy if you will be nursing the baby.  You may get a dark line from your belly button to the pubic area called the linea nigra.  You may develop rosy cheeks because of increase blood flow to the face.  You may develop spider looking lines of the face, neck, arms and chest. These go away after the baby is born. HOME CARE INSTRUCTIONS   It is extremely important to avoid all smoking, herbs, alcohol, and unprescribed drugs during your pregnancy. These chemicals affect the formation and growth of the baby. Avoid these chemicals throughout the pregnancy to ensure the delivery of a healthy infant.  Most of your home care instructions are the same  as suggested for the first trimester of your pregnancy. Keep your caregiver's appointments. Follow your caregiver's instructions regarding medication use, exercise and diet.  During pregnancy, you are providing food for you and your baby. Continue to eat regular, well-balanced meals. Choose foods such as meat, fish, milk and other low fat dairy products, vegetables, fruits, and whole-grain breads and cereals. Your caregiver will tell you of the ideal weight gain.  A physical sexual relationship may be continued up until near the end of pregnancy if there are no other problems. Problems could include early (premature) leaking of amniotic fluid from the membranes, vaginal bleeding, abdominal pain, or other medical or pregnancy problems.  Exercise regularly if there are no restrictions. Check with your caregiver if you are unsure of the safety of some of your exercises. The greatest weight gain will occur in the last 2 trimesters of pregnancy. Exercise will help you:  Control your weight.  Get you in shape for labor and delivery.  Lose weight   after you have the baby.  Wear a good support or jogging bra for breast tenderness during pregnancy. This may help if worn during sleep. Pads or tissues may be used in the bra if you are leaking colostrum.  Do not use hot tubs, steam rooms or saunas throughout the pregnancy.  Wear your seat belt at all times when driving. This protects you and your baby if you are in an accident.  Avoid raw meat, uncooked cheese, cat litter boxes and soil used by cats. These carry germs that can cause birth defects in the baby.  The second trimester is also a good time to visit your dentist for your dental health if this has not been done yet. Getting your teeth cleaned is OK. Use a soft toothbrush. Brush gently during pregnancy.  It is easier to loose urine during pregnancy. Tightening up and strengthening the pelvic muscles will help with this problem. Practice stopping  your urination while you are going to the bathroom. These are the same muscles you need to strengthen. It is also the muscles you would use as if you were trying to stop from passing gas. You can practice tightening these muscles up 10 times a set and repeating this about 3 times per day. Once you know what muscles to tighten up, do not perform these exercises during urination. It is more likely to contribute to an infection by backing up the urine.  Ask for help if you have financial, counseling or nutritional needs during pregnancy. Your caregiver will be able to offer counseling for these needs as well as refer you for other special needs.  Your skin may become oily. If so, wash your face with mild soap, use non-greasy moisturizer and oil or cream based makeup. MEDICATIONS AND DRUG USE IN PREGNANCY  Take prenatal vitamins as directed. The vitamin should contain 1 milligram of folic acid. Keep all vitamins out of reach of children. Only a couple vitamins or tablets containing iron may be fatal to a baby or young child when ingested.  Avoid use of all medications, including herbs, over-the-counter medications, not prescribed or suggested by your caregiver. Only take over-the-counter or prescription medicines for pain, discomfort, or fever as directed by your caregiver. Do not use aspirin.  Let your caregiver also know about herbs you may be using.  Alcohol is related to a number of birth defects. This includes fetal alcohol syndrome. All alcohol, in any form, should be avoided completely. Smoking will cause low birth rate and premature babies.  Street or illegal drugs are very harmful to the baby. They are absolutely forbidden. A baby born to an addicted mother will be addicted at birth. The baby will go through the same withdrawal an adult does. SEEK MEDICAL CARE IF:  You have any concerns or worries during your pregnancy. It is better to call with your questions if you feel they cannot wait,  rather than worry about them. SEEK IMMEDIATE MEDICAL CARE IF:   An unexplained oral temperature above 102 F (38.9 C) develops, or as your caregiver suggests.  You have leaking of fluid from the vagina (birth canal). If leaking membranes are suspected, take your temperature and tell your caregiver of this when you call.  There is vaginal spotting, bleeding, or passing clots. Tell your caregiver of the amount and how many pads are used. Light spotting in pregnancy is common, especially following intercourse.  You develop a bad smelling vaginal discharge with a change in the color from clear   to white.  You continue to feel sick to your stomach (nauseated) and have no relief from remedies suggested. You vomit blood or coffee ground-like materials.  You lose more than 2 pounds of weight or gain more than 2 pounds of weight over 1 week, or as suggested by your caregiver.  You notice swelling of your face, hands, feet, or legs.  You get exposed to German measles and have never had them.  You are exposed to fifth disease or chickenpox.  You develop belly (abdominal) pain. Round ligament discomfort is a common non-cancerous (benign) cause of abdominal pain in pregnancy. Your caregiver still must evaluate you.  You develop a bad headache that does not go away.  You develop fever, diarrhea, pain with urination, or shortness of breath.  You develop visual problems, blurry, or double vision.  You fall or are in a car accident or any kind of trauma.  There is mental or physical violence at home. Document Released: 12/08/2001 Document Revised: 03/07/2012 Document Reviewed: 06/12/2009 [employer] Patient Information 2013 [employer].  Breastfeeding Deciding to breastfeed is one of the best choices you can make for you and your baby. The information that follows gives a brief overview of the benefits of breastfeeding as well as common topics surrounding breastfeeding. BENEFITS OF  BREASTFEEDING For the baby  The first milk (colostrum) helps the baby's digestive system function better.   There are antibodies in the mother's milk that help the baby fight off infections.   The baby has a lower incidence of asthma, allergies, and sudden infant death syndrome (SIDS).   The nutrients in breast milk are better for the baby than infant formulas, and breast milk helps the baby's brain grow better.   Babies who breastfeed have less gas, colic, and constipation.  For the mother  Breastfeeding helps develop a very special bond between the mother and her baby.   Breastfeeding is convenient, always available at the correct temperature, and costs nothing.   Breastfeeding burns calories in the mother and helps her lose weight that was gained during pregnancy.   Breastfeeding makes the uterus contract back down to normal size faster and slows bleeding following delivery.   Breastfeeding mothers have a lower risk of developing breast cancer.  BREASTFEEDING FREQUENCY  A healthy, full-term baby may breastfeed as often as every hour or space his or her feedings to every 3 hours.   Watch your baby for signs of hunger. Nurse your baby if he or she shows signs of hunger. How often you nurse will vary from baby to baby.   Nurse as often as the baby requests, or when you feel the need to reduce the fullness of your breasts.   Awaken the baby if it has been 3 4 hours since the last feeding.   Frequent feeding will help the mother make more milk and will help prevent problems, such as sore nipples and engorgement of the breasts.  BABY'S POSITION AT THE BREAST  Whether lying down or sitting, be sure that the baby's tummy is facing your tummy.   Support the breast with 4 fingers underneath the breast and the thumb above. Make sure your fingers are well away from the nipple and baby's mouth.   Stroke the baby's lips gently with your finger or nipple.   When the  baby's mouth is open wide enough, place all of your nipple and as much of the areola as possible into your baby's mouth.   Pull the baby in   close so the tip of the nose and the baby's cheeks touch the breast during the feeding.  FEEDINGS AND SUCTION  The length of each feeding varies from baby to baby and from feeding to feeding.   The baby must suck about 2 3 minutes for your milk to get to him or her. This is called a "let down." For this reason, allow the baby to feed on each breast as long as he or she wants. Your baby will end the feeding when he or she has received the right balance of nutrients.   To break the suction, put your finger into the corner of the baby's mouth and slide it between his or her gums before removing your breast from his or her mouth. This will help prevent sore nipples.  HOW TO TELL WHETHER YOUR BABY IS GETTING ENOUGH BREAST MILK. Wondering whether or not your baby is getting enough milk is a common concern among mothers. You can be assured that your baby is getting enough milk if:   Your baby is actively sucking and you hear swallowing.   Your baby seems relaxed and satisfied after a feeding.   Your baby nurses at least 8 12 times in a 24 hour time period. Nurse your baby until he or she unlatches or falls asleep at the first breast (at least 10 20 minutes), then offer the second side.   Your baby is wetting 5 6 disposable diapers (6 8 cloth diapers) in a 24 hour period by 5 6 days of age.   Your baby is having at least 3 4 stools every 24 hours for the first 6 weeks. The stool should be soft and yellow.   Your baby should gain 4 7 ounces per week after he or she is 4 days old.   Your breasts feel softer after nursing.  REDUCING BREAST ENGORGEMENT  In the first week after your baby is born, you may experience signs of breast engorgement. When breasts are engorged, they feel heavy, warm, full, and may be tender to the touch. You can reduce  engorgement if you:   Nurse frequently, every 2 3 hours. Mothers who breastfeed early and often have fewer problems with engorgement.   Place light ice packs on your breasts for 10 20 minutes between feedings. This reduces swelling. Wrap the ice packs in a lightweight towel to protect your skin. Bags of frozen vegetables work well for this purpose.   Take a warm shower or apply warm, moist heat to your breast for 5 10 minutes just before each feeding. This increases circulation and helps the milk flow.   Gently massage your breast before and during the feeding. Using your finger tips, massage from the chest wall towards your nipple in a circular motion.   Make sure that the baby empties at least one breast at every feeding before switching sides.   Use a breast pump to empty the breasts if your baby is sleepy or not nursing well. You may also want to pump if you are returning to work oryou feel you are getting engorged.   Avoid bottle feeds, pacifiers, or supplemental feedings of water or juice in place of breastfeeding. Breast milk is all the food your baby needs. It is not necessary for your baby to have water or formula. In fact, to help your breasts make more milk, it is best not to give your baby supplemental feedings during the early weeks.   Be sure the baby is latched   on and positioned properly while breastfeeding.   Wear a supportive bra, avoiding underwire styles.   Eat a balanced diet with enough fluids.   Rest often, relax, and take your prenatal vitamins to prevent fatigue, stress, and anemia.  If you follow these suggestions, your engorgement should improve in 24 48 hours. If you are still experiencing difficulty, call your lactation consultant or caregiver.  CARING FOR YOURSELF Take care of your breasts  Bathe or shower daily.   Avoid using soap on your nipples.   Start feedings on your left breast at one feeding and on your right breast at the next  feeding.   You will notice an increase in your milk supply 2 5 days after delivery. You may feel some discomfort from engorgement, which makes your breasts very firm and often tender. Engorgement "peaks" out within 24 48 hours. In the meantime, apply warm moist towels to your breasts for 5 10 minutes before feeding. Gentle massage and expression of some milk before feeding will soften your breasts, making it easier for your baby to latch on.   Wear a well-fitting nursing bra, and air dry your nipples for a 3 4minutes after each feeding.   Only use cotton bra pads.   Only use pure lanolin on your nipples after nursing. You do not need to wash it off before feeding the baby again. Another option is to express a few drops of breast milk and gently massage it into your nipples.  Take care of yourself  Eat well-balanced meals and nutritious snacks.   Drinking milk, fruit juice, and water to satisfy your thirst (about 8 glasses a day).   Get plenty of rest.  Avoid foods that you notice affect the baby in a bad way.  SEEK MEDICAL CARE IF:   You have difficulty with breastfeeding and need help.   You have a hard, red, sore area on your breast that is accompanied by a fever.   Your baby is too sleepy to eat well or is having trouble sleeping.   Your baby is wetting less than 6 diapers a day, by 5 days of age.   Your baby's skin or white part of his or her eyes is more yellow than it was in the hospital.   You feel depressed.  Document Released: 12/14/2005 Document Revised: 06/14/2012 Document Reviewed: 03/13/2012 [employer] Patient Information 2013 [employer].  

## 2013-05-04 LAB — HIV ANTIBODY (ROUTINE TESTING W REFLEX): HIV: NONREACTIVE

## 2013-05-05 ENCOUNTER — Ambulatory Visit (HOSPITAL_COMMUNITY)
Admission: RE | Admit: 2013-05-05 | Discharge: 2013-05-05 | Disposition: A | Discharge status: Home / Self Care | Payer: Medicaid Other | Source: Ambulatory Visit | Attending: Family Medicine | Admitting: Family Medicine

## 2013-05-05 ENCOUNTER — Other Ambulatory Visit: Payer: Self-pay

## 2013-05-05 ENCOUNTER — Encounter: Payer: Self-pay | Admitting: Family Medicine

## 2013-05-05 DIAGNOSIS — Z3689 Encounter for other specified antenatal screening: Secondary | ICD-10-CM | POA: Insufficient documentation

## 2013-05-05 DIAGNOSIS — O351XX0 Maternal care for (suspected) chromosomal abnormality in fetus, not applicable or unspecified: Secondary | ICD-10-CM | POA: Insufficient documentation

## 2013-05-05 DIAGNOSIS — O09529 Supervision of elderly multigravida, unspecified trimester: Secondary | ICD-10-CM | POA: Insufficient documentation

## 2013-05-05 DIAGNOSIS — O3510X0 Maternal care for (suspected) chromosomal abnormality in fetus, unspecified, not applicable or unspecified: Secondary | ICD-10-CM | POA: Insufficient documentation

## 2013-05-05 DIAGNOSIS — Z3682 Encounter for antenatal screening for nuchal translucency: Secondary | ICD-10-CM

## 2013-05-05 DIAGNOSIS — R87619 Unspecified abnormal cytological findings in specimens from cervix uteri: Secondary | ICD-10-CM | POA: Insufficient documentation

## 2013-05-05 LAB — CULTURE, OB URINE: Colony Count: >=100000

## 2013-05-05 LAB — OBSTETRIC PANEL
Antibody Screen: NEGATIVE
Eosinophils Relative: 1 % (ref 0–5)
HCT: 33.9 % — ABNORMAL LOW (ref 36.0–46.0)
Lymphocytes Relative: 23 % (ref 12–46)
Lymphs Abs: 2.2 10*3/uL (ref 0.7–4.0)
MCV: 90.6 fL (ref 78.0–100.0)
Monocytes Absolute: 0.6 10*3/uL (ref 0.1–1.0)
RBC: 3.74 MIL/uL — ABNORMAL LOW (ref 3.87–5.11)
Rubella: 9.5 Index — ABNORMAL HIGH (ref <0.90)
WBC: 9.8 10*3/uL (ref 4.0–10.5)

## 2013-05-05 NOTE — Progress Notes (Signed)
Maureen Simpson  was seen today for an ultrasound appointment.  See full report in [facility].  Impression: Single IUP at 13 2/7 weeks Advanced maternal age Unable to obtain an adequate NT measurement due to fetal position Nasal bone visualized  After counseling, the patient elected to undergo NIPT (cell free fetal DNA testing)  Recommendations: Recommend follow-up ultrasound examination in 5 weeks for anatomy Would offer MSAFP testing to screen for ONTD at 15-[redacted] weeks gestation.  Alpha Gula, MD

## 2013-05-08 ENCOUNTER — Telehealth: Payer: Self-pay | Admitting: Obstetrics and Gynecology

## 2013-05-08 NOTE — Telephone Encounter (Addendum)
Message copied by Toula Moos on Mon May 08, 2013  3:29 PM ------ Notified patient of result and needing of colpo. appt made and notified patient. Patient agrees and satisfied.       Message from: FERRY, [place]      Created: Fri May 05, 2013 10:58 PM       Needs colpo for [facility]. ------

## 2013-05-16 ENCOUNTER — Telehealth (HOSPITAL_COMMUNITY): Payer: Self-pay | Admitting: MS"

## 2013-05-16 NOTE — Telephone Encounter (Signed)
Called Maureen Simpson to discuss her [facility], cell free fetal DNA testing. Patient was identified by name and DOB. Testing was offered because of advanced maternal age. We reviewed that these are within normal limits, showing a less than 1 in 10,000 risk for trisomies 21, 18 and 13. We reviewed that this testing identifies > 99% of pregnancies with trisomy 21, >98% of pregnancies with trisomy 28, and >80% with trisomy 106; the false positive rate is <0.1% for all conditions. She understands that this testing does not identify all genetic conditions. All questions were answered to her satisfaction, she was encouraged to call with additional questions or concerns.  Quinn Plowman, MS Patent attorney

## 2013-05-21 ENCOUNTER — Inpatient Hospital Stay (HOSPITAL_COMMUNITY)
Admission: AD | Admit: 2013-05-21 | Discharge: 2013-05-21 | Disposition: A | Discharge status: Home / Self Care | Payer: Medicaid Other | Source: Ambulatory Visit | Attending: Obstetrics and Gynecology | Admitting: Obstetrics and Gynecology

## 2013-05-21 ENCOUNTER — Encounter (HOSPITAL_COMMUNITY): Payer: Self-pay | Admitting: Family

## 2013-05-21 DIAGNOSIS — M25519 Pain in unspecified shoulder: Secondary | ICD-10-CM | POA: Insufficient documentation

## 2013-05-21 DIAGNOSIS — T148XXA Other injury of unspecified body region, initial encounter: Secondary | ICD-10-CM

## 2013-05-21 DIAGNOSIS — N949 Unspecified condition associated with female genital organs and menstrual cycle: Secondary | ICD-10-CM

## 2013-05-21 DIAGNOSIS — O99891 Other specified diseases and conditions complicating pregnancy: Secondary | ICD-10-CM | POA: Insufficient documentation

## 2013-05-21 DIAGNOSIS — R109 Unspecified abdominal pain: Secondary | ICD-10-CM | POA: Insufficient documentation

## 2013-05-21 HISTORY — DX: Urinary tract infection, site not specified: N39.0

## 2013-05-21 LAB — URINALYSIS, ROUTINE W REFLEX MICROSCOPIC
Bilirubin Urine: NEGATIVE
Glucose, UA: NEGATIVE mg/dL
Ketones, ur: 15 mg/dL — AB
Leukocytes, UA: NEGATIVE
Protein, ur: NEGATIVE mg/dL

## 2013-05-21 MED ORDER — ACETAMINOPHEN 500 MG PO TABS
1000.0000 mg | ORAL_TABLET | Freq: Once | ORAL | Status: AC
  Administered 2013-05-21: 1000 mg via ORAL
  Filled 2013-05-21: qty 2

## 2013-05-21 MED ORDER — CYCLOBENZAPRINE HCL 10 MG PO TABS
10.0000 mg | ORAL_TABLET | Freq: Once | ORAL | Status: AC
  Administered 2013-05-21: 10 mg via ORAL
  Filled 2013-05-21: qty 1

## 2013-05-21 MED ORDER — CYCLOBENZAPRINE HCL 10 MG PO TABS: 10.0000 mg | ORAL_TABLET | Freq: Two times a day (BID) | ORAL | Status: DC | PRN

## 2013-05-21 NOTE — MAU Provider Note (Signed)
History     CSN: [id number]  Arrival date and time: 05/21/13 1217   First Provider Initiated Contact with Patient 05/21/13 1312      Chief Complaint  Patient presents with  . Shoulder Pain   HPI [name] is  37 y.o. G1P0 at [redacted]w[redacted]d who presents to MAU today with shoulder pain. The patient states that she has occasional sharp pains over her right shoulder blade near her her spine. She denies any changes in activity or recent injury. The patient also states occasional sharp pain bilaterally of the mid-abdomen. The patient denies pain otherwise. She denies vaginal bleeding, discharge or LOF. The pain is worse with change or position and ambulation and relieved often with rest.   OB History   Grav Para Term Preterm Abortions TAB SAB Ect Mult Living   1               Past Medical History  Diagnosis Date  . Lipoma     right forearm  . No pertinent past medical history   . Asthma   . UTI (lower urinary tract infection)     Past Surgical History  Procedure Laterality Date  . Lipoma excision  05/26/2012    Procedure: EXCISION LIPOMA;  Surgeon: Atilano Ina, MD,FACS;  Location: [facility];  Service: General;  Laterality: Right;  excision of right proximal forearm lipoma    Family History  Problem Relation Age of Onset  . Hypertension Mother   . Hypertension Maternal Aunt   . Cancer Maternal Grandmother     colon     History  Substance Use Topics  . Smoking status: Former Smoker -- 0.25 packs/day    Types: Cigarettes    Quit date: 04/27/2013  . Smokeless tobacco: Never Used  . Alcohol Use: No     Comment: SOCIAL    Allergies:  Allergies  Allergen Reactions  . Pollen Extract     Prescriptions prior to admission  Medication Sig Dispense Refill  . cephALEXin (KEFLEX) 500 MG capsule Take 1 capsule (500 mg total) by mouth 4 (four) times daily.  28 capsule  0  . Prenatal Multivit-Min-Fe-FA (PRENATAL VITAMINS) 0.8 MG tablet Take 1 tablet by  mouth daily.  30 tablet  12    Review of Systems  Constitutional: Negative for fever.  Gastrointestinal: Positive for abdominal pain. Negative for nausea, vomiting, diarrhea and constipation.  Genitourinary: Negative for dysuria, urgency and frequency.       Neg - vaginal bleeding, discharge, LOF  Musculoskeletal: Positive for back pain.   Physical Exam   Blood pressure 116/79, pulse 103, temperature 98.2 F (36.8 C), temperature source Oral, resp. rate 18, height 5' 3.5" (1.613 m), weight 151 lb (68.493 kg), last menstrual period 02/21/2013.  Physical Exam  Constitutional: She is oriented to person, place, and time. She appears well-developed and well-nourished. No distress.  HENT:  Head: Normocephalic and atraumatic.  Cardiovascular: Normal rate, regular rhythm and normal heart sounds.   Respiratory: Effort normal and breath sounds normal. No respiratory distress.  GI: Soft. Bowel sounds are normal. She exhibits no distension and no mass. There is no tenderness. There is no rebound and no guarding.  Musculoskeletal: Normal range of motion. She exhibits no edema and no tenderness.       Cervical back: She exhibits normal range of motion, no tenderness, no bony tenderness, no swelling, no edema, no deformity, no laceration, no pain and no spasm.  Back:  Neurological: She is alert and oriented to person, place, and time.  Skin: Skin is warm and dry. No erythema.  Psychiatric: She has a normal mood and affect.    Results for orders placed during the hospital encounter of 05/21/13 (from the past 24 hour(s))  URINALYSIS, ROUTINE W REFLEX MICROSCOPIC     Status: Abnormal   Collection Time    05/21/13 12:25 PM      Result Value Range   Color, Urine YELLOW  YELLOW   APPearance CLEAR  CLEAR   Specific Gravity, Urine 1.025  1.005 - 1.030   pH 6.0  5.0 - 8.0   Glucose, UA NEGATIVE  NEGATIVE mg/dL   Hgb urine dipstick NEGATIVE  NEGATIVE   Bilirubin Urine NEGATIVE  NEGATIVE    Ketones, ur 15 (*) NEGATIVE mg/dL   Protein, ur NEGATIVE  NEGATIVE mg/dL   Urobilinogen, UA 1.0  0.0 - 1.0 mg/dL   Nitrite NEGATIVE  NEGATIVE   Leukocytes, UA NEGATIVE  NEGATIVE    MAU Course  Procedures  MDM +FHTs today Mild dehydation on UA Flexeril given - patient reports improvement  Assessment and Plan  A: Round ligament pain Muscle strain, mid thoracic back  P: Discharge home Rx for Flexeril sent to patient's pharmacy Patient encouraged to use Tylenol PRN pain, abdominal binder and warm baths for discomfort Patient encouraged to keep follow-up as scheduled in [facility] clinic Patient may return to MAU as needed Freddi Starr, PA-C  05/21/2013, 1:12 PM

## 2013-05-21 NOTE — MAU Note (Signed)
Pt presents with complaints of shooting pains in her right shoulder for approximately 1 week. Now she is having pain in her right side.

## 2013-05-21 NOTE — MAU Note (Addendum)
Patient presents to MAU with intermittent sharp R shoulder pain x 1 week; denies changes in exercise or ADLs.  Reports having sharp pain in R abdomen yesterday. Denies ha, blurred vision or other lateralizing s/s.  Denies vaginal bleeding, discharge or cramping. Took 1000mg  Tylenol today at 0800.

## 2013-05-21 NOTE — MAU Provider Note (Signed)
Attestation of Attending Supervision of Advanced Practitioner (CNM/NP): Evaluation and management procedures were performed by the Advanced Practitioner under my supervision and collaboration.  I have reviewed the Advanced Practitioner's note and chart, and I agree with the management and plan.  Malayna Noori 05/21/2013 10:40 PM   

## 2013-05-29 ENCOUNTER — Ambulatory Visit (INDEPENDENT_AMBULATORY_CARE_PROVIDER_SITE_OTHER): Payer: Medicaid Other | Admitting: Obstetrics & Gynecology

## 2013-05-29 VITALS — BP 109/76 | HR 99 | Temp 98.7°F | Ht 63.0 in | Wt 153.0 lb

## 2013-05-29 DIAGNOSIS — R87610 Atypical squamous cells of undetermined significance on cytologic smear of cervix (ASC-US): Secondary | ICD-10-CM

## 2013-05-29 DIAGNOSIS — R87810 Cervical high risk human papillomavirus (HPV) DNA test positive: Secondary | ICD-10-CM

## 2013-05-29 NOTE — Progress Notes (Signed)
Patient ID: Maureen Simpson, female   DOB: 07/11/1976, 37 y.o.   MRN: [id number] Patient given informed consent, signed copy in the chart, time out was performed.  Placed in lithotomy position. Cervix viewed with speculum and colposcope after application of acetic acid.  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  Colposcopy adequate?  yes Acetowhite lesions?no Punctation?no Mosaicism?  no Abnormal vasculature?  no Biopsies?no ECC?no  Patient was given post procedure instructions.  She will return for follow up OB care as needed.  Needs repeat PAP after delivery.  Einar Nolasco L. Harraway-Smith, M.D., [facility]

## 2013-05-29 NOTE — Patient Instructions (Signed)
Colposcopy Colposcopy is a procedure that uses a special lighted microscope (colposcope). It examines your cervix and vagina, or the area around the outside of the vagina, for signs of disease or abnormalities in the cells. You may be sent to a specialist (gynecologist) to do the colposcopy. A biopsy (tissue sample) may be collected during a colposcopy, if the caregiver finds any unusual cells. The biopsy is sent to the lab for further testing, and the results are reported back to your caregiver. A WOMAN MAY NEED THIS PROCEDURE IF:  She has had an abnormal pap smear (taking cells from the cervix for testing).  She has a sore on her cervix, and a Pap test was normal.  The Pap test suggests human papilloma virus (HPV). This virus can cause genital warts and is linked to the development of cervical cancer.  She has genital warts on the cervix, or in or around the outside of the vagina.  Her mother took the drug DES while pregnant.  She has painful intercourse.  She has vaginal bleeding, especially after sexual intercourse.  There is a need to evaluate the results of previous treatment. BEFORE THE PROCEDURE   Colposcopy is done when you are not having a menstrual period.  For 24 hours before the colposcopy, do not:  Douche.  Use tampons.  Use medicines, creams, or suppositories in the vagina.  Have sexual intercourse. PROCEDURE   A colposcopy is done while a woman is lying on her back with her feet in foot rests (stirrups).  A speculum is placed inside the vagina to keep it open and to allow the caregiver to see the cervix. This is the same instrument used to do a pap smear.  The colposcope is placed outside the vagina. It is used to magnify and examine the cervix, vagina, and the area around the outside of the vagina.  A small amount of liquid solution is placed on the area that is to be viewed. This solution is placed on with a cotton applicator. This solution makes it easier to  see the abnormal cells.  Your caregiver will suck out mucus and cells from the canal of the cervix.  Small pieces of tissue for biopsy may be taken at the same time. You may feel mild pain or discomfort when this is done.  Your caregiver will record the location of the abnormal areas and send the tissue samples to a lab for analysis.  If your caregiver biopsies the vagina or outside of the vagina, a local anesthetic (novocaine) is usually given. AFTER THE PROCEDURE   You may have some cramping that often goes away in a few minutes. You may have some soreness for a couple of days.  You may take over-the-counter pain medicine as advised by your caregiver. Do not take aspirin because it can cause bleeding.  Lie down for a few minutes if you feel lightheaded.  You may have some bleeding or dark discharge that should stop in a few days.  You may need to wear a sanitary pad for a few days. HOME CARE INSTRUCTIONS   Avoid sex, douching, and using tampons for a week or as directed.  Only take medicine as directed by your caregiver.  Continue to take birth control pills, if you are on them.  Not all test results are available during your visit. If your test results are not back during the visit, make an appointment with your caregiver to find out the results. Do not assume everything is  normal if you have not heard from your caregiver or the medical facility. It is important for you to follow up on all of your test results.  Follow your caregiver's advice regarding medicines, activity, follow-up visits, and follow-up Pap tests. SEEK MEDICAL CARE IF:   You develop a rash.  You have problems with your medicine. SEEK IMMEDIATE MEDICAL CARE IF:  You are bleeding heavily or are passing blood clots.  You develop a fever over 102 F (38.9 C), with or without chills.  You have abnormal vaginal discharge.  You are having cramps that do not go away after taking your pain medicine.  You  feel lightheaded, dizzy, or faint.  You develop stomach pain. Document Released: 03/06/2003 Document Revised: 03/07/2012 Document Reviewed: 10/17/2009 [facility] Patient Information 2014 [place], [place]. Abnormal Pap Test Information During a Pap test, the cells on the surface of your cervix are checked to see if they look normal, abnormal, or if they show signs of having been altered by a certain type of virus called human papillomavirus, or HPV. Cervical cells that have been affected by HPV are called dysplasia. Dysplasia is not cancer, but describes abnormal cells found on the surface of the cervix. Depending on the degree of dysplasia, some of the cells may be considered pre-cancerous and may turn into cancer over time if follow up with a caregiver is delayed.  WHAT DOES AN ABNORMAL PAP TEST MEAN? Having an abnormal pap test does not mean that you have cancer. However, certain types of abnormal pap tests can be a sign that a person is at a higher risk of developing cancer. Your caregiver will want to do other tests to find out more about the abnormal cells. Your abnormal Pap test results could show:   Small and uncertain changes that should be carefully watched.   Cervical dysplasia that has caused mild changes and can be followed over time.  Cervical dysplasia that is more severe and needs to be followed and treated to ensure the problem goes away.  Cancer.  When severe cervical dysplasia is found and treated early, it rarely will grow into cancer.  WHAT WILL BE DONE ABOUT MY ABNORMAL PAP TEST?  A colposcopy may be needed. This is a procedure where your cervix is examined using light and magnification.  A small tissue sample of your cervix (biopsy) may need to be removed and then examined. This is often performed if there are areas that appear infected.  A sample of cells from the cervical canal may be removed with either a small brush or scraping instrument (curette). Based on the  results of the procedures above, some caregivers may recommend either cryotherapy of the cervix or a surgical LEEP where a portion of the cervix is removed. LEEP is short for "loop electrical excisional procedure." Rarely, a caregiver may recommend a cone biopsy.This is a procedure where a small, cone-shaped sample of your cervix is taken out. The part that is taken out is the area where the abnormal cells are.  WHAT IF I HAVE A DYSPLASIA OR A CANCER? You may be referred to a specialist. Radiation may also be a treatment for more advanced cancer. Having a hysterectomy is the last treatment option for dysplasia, but it is a more common treatment for someone with cancer. All treatment options will be discussed with you by your caregiver. WHAT SHOULD YOU DO AFTER BEING TREATED? If you have had an abnormal pap test, you should continue to have regular pap tests and  check-ups as directed by your caregiver. Your cervical problem will be carefully watched so it does not get worse. Also, your caregiver can watch for, and treat, any new problems that may come up. Document Released: 03/31/2011 Document Revised: 03/07/2012 Document Reviewed: 12/10/2011 [facility] Patient Information 2014 [place], [place].

## 2013-05-31 ENCOUNTER — Ambulatory Visit (INDEPENDENT_AMBULATORY_CARE_PROVIDER_SITE_OTHER): Payer: Medicaid Other | Admitting: Obstetrics and Gynecology

## 2013-05-31 ENCOUNTER — Encounter: Payer: Self-pay | Admitting: Obstetrics and Gynecology

## 2013-05-31 VITALS — BP 121/73 | Temp 98.3°F | Wt 153.6 lb

## 2013-05-31 DIAGNOSIS — O09529 Supervision of elderly multigravida, unspecified trimester: Secondary | ICD-10-CM

## 2013-05-31 DIAGNOSIS — Z3492 Encounter for supervision of normal pregnancy, unspecified, second trimester: Secondary | ICD-10-CM

## 2013-05-31 LAB — POCT URINALYSIS DIP (DEVICE)
Hgb urine dipstick: NEGATIVE
Protein, ur: NEGATIVE mg/dL
Specific Gravity, Urine: 1.025 (ref 1.005–1.030)
Urobilinogen, UA: 0.2 mg/dL (ref 0.0–1.0)
pH: 7 (ref 5.0–8.0)

## 2013-05-31 MED ORDER — PRENATAL MULTIVITAMIN CH: 1.0000 | ORAL_TABLET | Freq: Every day | ORAL | Status: DC

## 2013-05-31 NOTE — Patient Instructions (Signed)
Pregnancy - Second Trimester The second trimester of pregnancy (3 to 6 months) is a period of rapid growth for you and your baby. At the end of the sixth month, your baby is about 9 inches long and weighs 1 1/2 pounds. You will begin to feel the baby move between 18 and 20 weeks of the pregnancy. This is called quickening. Weight gain is faster. A clear fluid (colostrum) may leak out of your breasts. You may feel small contractions of the womb (uterus). This is known as false labor or Braxton-Hicks contractions. This is like a practice for labor when the baby is ready to be born. Usually, the problems with morning sickness have usually passed by the end of your first trimester. Some women develop small dark blotches (called cholasma, mask of pregnancy) on their face that usually goes away after the baby is born. Exposure to the sun makes the blotches worse. Acne may also develop in some pregnant women and pregnant women who have acne, may find that it goes away. PRENATAL EXAMS  Blood work may continue to be done during prenatal exams. These tests are done to check on your health and the probable health of your baby. Blood work is used to follow your blood levels (hemoglobin). Anemia (low hemoglobin) is common during pregnancy. Iron and vitamins are given to help prevent this. You will also be checked for diabetes between 24 and 28 weeks of the pregnancy. Some of the previous blood tests may be repeated.  The size of the uterus is measured during each visit. This is to make sure that the baby is continuing to grow properly according to the dates of the pregnancy.  Your blood pressure is checked every prenatal visit. This is to make sure you are not getting toxemia.  Your urine is checked to make sure you do not have an infection, diabetes or protein in the urine.  Your weight is checked often to make sure gains are happening at the suggested rate. This is to ensure that both you and your baby are  growing normally.  Sometimes, an ultrasound is performed to confirm the proper growth and development of the baby. This is a test which bounces harmless sound waves off the baby so your caregiver can more accurately determine due dates. Sometimes, a test is done on the amniotic fluid surrounding the baby. This test is called an amniocentesis. The amniotic fluid is obtained by sticking a needle into the belly (abdomen). This is done to check the chromosomes in instances where there is a concern about possible genetic problems with the baby. It is also sometimes done near the end of pregnancy if an early delivery is required. In this case, it is done to help make sure the baby's lungs are mature enough for the baby to live outside of the womb. CHANGES OCCURING IN THE SECOND TRIMESTER OF PREGNANCY Your body goes through many changes during pregnancy. They vary from person to person. Talk to your caregiver about changes you notice that you are concerned about.  During the second trimester, you will likely have an increase in your appetite. It is normal to have cravings for certain foods. This varies from person to person and pregnancy to pregnancy.  Your lower abdomen will begin to bulge.  You may have to urinate more often because the uterus and baby are pressing on your bladder. It is also common to get more bladder infections during pregnancy. You can help this by drinking lots of fluids   and emptying your bladder before and after intercourse.  You may begin to get stretch marks on your hips, abdomen, and breasts. These are normal changes in the body during pregnancy. There are no exercises or medicines to take that prevent this change.  You may begin to develop swollen and bulging veins (varicose veins) in your legs. Wearing support hose, elevating your feet for 15 minutes, 3 to 4 times a day and limiting salt in your diet helps lessen the problem.  Heartburn may develop as the uterus grows and  pushes up against the stomach. Antacids recommended by your caregiver helps with this problem. Also, eating smaller meals 4 to 5 times a day helps.  Constipation can be treated with a stool softener or adding bulk to your diet. Drinking lots of fluids, and eating vegetables, fruits, and whole grains are helpful.  Exercising is also helpful. If you have been very active up until your pregnancy, most of these activities can be continued during your pregnancy. If you have been less active, it is helpful to start an exercise program such as walking.  Hemorrhoids may develop at the end of the second trimester. Warm sitz baths and hemorrhoid cream recommended by your caregiver helps hemorrhoid problems.  Backaches may develop during this time of your pregnancy. Avoid heavy lifting, wear low heal shoes, and practice good posture to help with backache problems.  Some pregnant women develop tingling and numbness of their hand and fingers because of swelling and tightening of ligaments in the wrist (carpel tunnel syndrome). This goes away after the baby is born.  As your breasts enlarge, you may have to get a bigger bra. Get a comfortable, cotton, support bra. Do not get a nursing bra until the last month of the pregnancy if you will be nursing the baby.  You may get a dark line from your belly button to the pubic area called the linea nigra.  You may develop rosy cheeks because of increase blood flow to the face.  You may develop spider looking lines of the face, neck, arms, and chest. These go away after the baby is born. HOME CARE INSTRUCTIONS   It is extremely important to avoid all smoking, herbs, alcohol, and unprescribed drugs during your pregnancy. These chemicals affect the formation and growth of the baby. Avoid these chemicals throughout the pregnancy to ensure the delivery of a healthy infant.  Most of your home care instructions are the same as suggested for the first trimester of your  pregnancy. Keep your caregiver's appointments. Follow your caregiver's instructions regarding medicine use, exercise, and diet.  During pregnancy, you are providing food for you and your baby. Continue to eat regular, well-balanced meals. Choose foods such as meat, fish, milk and other low fat dairy products, vegetables, fruits, and whole-grain breads and cereals. Your caregiver will tell you of the ideal weight gain.  A physical sexual relationship may be continued up until near the end of pregnancy if there are no other problems. Problems could include early (premature) leaking of amniotic fluid from the membranes, vaginal bleeding, abdominal pain, or other medical or pregnancy problems.  Exercise regularly if there are no restrictions. Check with your caregiver if you are unsure of the safety of some of your exercises. The greatest weight gain will occur in the last 2 trimesters of pregnancy. Exercise will help you:  Control your weight.  Get you in shape for labor and delivery.  Lose weight after you have the baby.  Wear   a good support or jogging bra for breast tenderness during pregnancy. This may help if worn during sleep. Pads or tissues may be used in the bra if you are leaking colostrum.  Do not use hot tubs, steam rooms or saunas throughout the pregnancy.  Wear your seat belt at all times when driving. This protects you and your baby if you are in an accident.  Avoid raw meat, uncooked cheese, cat litter boxes, and soil used by cats. These carry germs that can cause birth defects in the baby.  The second trimester is also a good time to visit your dentist for your dental health if this has not been done yet. Getting your teeth cleaned is okay. Use a soft toothbrush. Brush gently during pregnancy.  It is easier to leak urine during pregnancy. Tightening up and strengthening the pelvic muscles will help with this problem. Practice stopping your urination while you are going to the  bathroom. These are the same muscles you need to strengthen. It is also the muscles you would use as if you were trying to stop from passing gas. You can practice tightening these muscles up 10 times a set and repeating this about 3 times per day. Once you know what muscles to tighten up, do not perform these exercises during urination. It is more likely to contribute to an infection by backing up the urine.  Ask for help if you have financial, counseling, or nutritional needs during pregnancy. Your caregiver will be able to offer counseling for these needs as well as refer you for other special needs.  Your skin may become oily. If so, wash your face with mild soap, use non-greasy moisturizer and oil or cream based makeup. MEDICINES AND DRUG USE IN PREGNANCY  Take prenatal vitamins as directed. The vitamin should contain 1 milligram of folic acid. Keep all vitamins out of reach of children. Only a couple vitamins or tablets containing iron may be fatal to a baby or young child when ingested.  Avoid use of all medicines, including herbs, over-the-counter medicines, not prescribed or suggested by your caregiver. Only take over-the-counter or prescription medicines for pain, discomfort, or fever as directed by your caregiver. Do not use aspirin.  Let your caregiver also know about herbs you may be using.  Alcohol is related to a number of birth defects. This includes fetal alcohol syndrome. All alcohol, in any form, should be avoided completely. Smoking will cause low birth rate and premature babies.  Street or illegal drugs are very harmful to the baby. They are absolutely forbidden. A baby born to an addicted mother will be addicted at birth. The baby will go through the same withdrawal an adult does. SEEK MEDICAL CARE IF:  You have any concerns or worries during your pregnancy. It is better to call with your questions if you feel they cannot wait, rather than worry about them. SEEK IMMEDIATE  MEDICAL CARE IF:   An unexplained oral temperature above 102 F (38.9 C) develops, or as your caregiver suggests.  You have leaking of fluid from the vagina (birth canal). If leaking membranes are suspected, take your temperature and tell your caregiver of this when you call.  There is vaginal spotting, bleeding, or passing clots. Tell your caregiver of the amount and how many pads are used. Light spotting in pregnancy is common, especially following intercourse.  You develop a bad smelling vaginal discharge with a change in the color from clear to white.  You continue to feel   sick to your stomach (nauseated) and have no relief from remedies suggested. You vomit blood or coffee ground-like materials.  You lose more than 2 pounds of weight or gain more than 2 pounds of weight over 1 week, or as suggested by your caregiver.  You notice swelling of your face, hands, feet, or legs.  You get exposed to German measles and have never had them.  You are exposed to fifth disease or chickenpox.  You develop belly (abdominal) pain. Round ligament discomfort is a common non-cancerous (benign) cause of abdominal pain in pregnancy. Your caregiver still must evaluate you.  You develop a bad headache that does not go away.  You develop fever, diarrhea, pain with urination, or shortness of breath.  You develop visual problems, blurry, or double vision.  You fall or are in a car accident or any kind of trauma.  There is mental or physical violence at home. Document Released: 12/08/2001 Document Revised: 09/07/2012 Document Reviewed: 06/12/2009 [employer] Patient Information 2014 [employer].  

## 2013-05-31 NOTE — Progress Notes (Signed)
P=103, c/o pain and numbness in left arm sometimes,

## 2013-05-31 NOTE — Progress Notes (Signed)
Harmony results normal. Declines MSAFP. Wrist pain 2/10, may be early CTS. Anatomic scan scheduled. PNV refilled.

## 2013-06-08 ENCOUNTER — Other Ambulatory Visit: Payer: Self-pay | Admitting: Advanced Practice Midwife

## 2013-06-08 DIAGNOSIS — Z0489 Encounter for examination and observation for other specified reasons: Secondary | ICD-10-CM

## 2013-06-08 DIAGNOSIS — O09529 Supervision of elderly multigravida, unspecified trimester: Secondary | ICD-10-CM

## 2013-06-09 ENCOUNTER — Ambulatory Visit (HOSPITAL_COMMUNITY)
Admission: RE | Admit: 2013-06-09 | Discharge: 2013-06-09 | Disposition: A | Discharge status: Home / Self Care | Payer: Medicaid Other | Source: Ambulatory Visit | Attending: Family Medicine | Admitting: Family Medicine

## 2013-06-09 ENCOUNTER — Encounter: Payer: Self-pay | Admitting: Advanced Practice Midwife

## 2013-06-09 VITALS — BP 113/76 | HR 106 | Wt 159.5 lb

## 2013-06-09 DIAGNOSIS — Z0489 Encounter for examination and observation for other specified reasons: Secondary | ICD-10-CM

## 2013-06-09 DIAGNOSIS — Z363 Encounter for antenatal screening for malformations: Secondary | ICD-10-CM | POA: Insufficient documentation

## 2013-06-09 DIAGNOSIS — O358XX0 Maternal care for other (suspected) fetal abnormality and damage, not applicable or unspecified: Secondary | ICD-10-CM | POA: Insufficient documentation

## 2013-06-09 DIAGNOSIS — O4402 Placenta previa specified as without hemorrhage, second trimester: Secondary | ICD-10-CM

## 2013-06-09 DIAGNOSIS — O09529 Supervision of elderly multigravida, unspecified trimester: Secondary | ICD-10-CM | POA: Insufficient documentation

## 2013-06-09 DIAGNOSIS — Z1389 Encounter for screening for other disorder: Secondary | ICD-10-CM | POA: Insufficient documentation

## 2013-06-09 NOTE — Progress Notes (Signed)
Maureen Simpson  was seen today for an ultrasound appointment.  See full report in [facility].  Impression: Single IUP at 18 2/7 weeks Advanced maternal age.  Cell free fetal DNA - low risk for aneuploidy Normal detailed fetal anatomy. No markers associated with aneuploidy noted Normal amniotic fluid volume  Low lying posterior placenta noted  Recommendations: Recommend follow-up ultrasound examination in 6 weeks to reasses placental location.  Alpha Gula, MD

## 2013-06-23 ENCOUNTER — Telehealth: Payer: Self-pay | Admitting: *Deleted

## 2013-06-23 NOTE — Telephone Encounter (Signed)
Patient left a message that her arms and hands have numbness and tingling in hands and arms is getting worse.  She said it wakes her up at night. I recommended that if she felt that it couldn't wait till her next visit she could be seen in MAU. Pt agrees and will see how she feels.

## 2013-06-28 ENCOUNTER — Encounter: Payer: Self-pay | Admitting: Advanced Practice Midwife

## 2013-06-28 ENCOUNTER — Ambulatory Visit (INDEPENDENT_AMBULATORY_CARE_PROVIDER_SITE_OTHER): Payer: Medicaid Other | Admitting: Advanced Practice Midwife

## 2013-06-28 DIAGNOSIS — O09529 Supervision of elderly multigravida, unspecified trimester: Secondary | ICD-10-CM

## 2013-06-28 LAB — POCT URINALYSIS DIP (DEVICE)
Glucose, UA: NEGATIVE mg/dL
Nitrite: NEGATIVE
Protein, ur: 30 mg/dL — AB
Specific Gravity, Urine: 1.025 (ref 1.005–1.030)
Urobilinogen, UA: 1 mg/dL (ref 0.0–1.0)
pH: 6.5 (ref 5.0–8.0)

## 2013-06-28 NOTE — Progress Notes (Signed)
Pulse- 98 

## 2013-06-28 NOTE — Progress Notes (Signed)
Low lying placenta (1.5 cm from os) on anatomy scan, repeat scheduled in 6 weeks, otherwise normal. C/O carpel tunnel and acne. Recommend carpel tunnel brace. Discussed normal pregnancy changes. Rev'd precautions. Discussed healthy diet and weight gain - drinking lots of juice and gatorade, recommend reducing sugary drinks.

## 2013-06-28 NOTE — Patient Instructions (Signed)
Pregnancy - Second Trimester The second trimester is the period between 13 to 27 weeks of your pregnancy. It is important to follow your doctor's instructions. HOME CARE   Do not smoke.  Do not drink alcohol or use drugs.  Only take medicine as told by your doctor.  Take prenatal vitamins as told. The vitamin should contain 1 milligram of folic acid.  Exercise.  Eat healthy foods. Eat regular, well-balanced meals.  You can have sex (intercourse) if there are no other problems with the pregnancy.  Do not use hot tubs, steam rooms, or saunas.  Wear a seat belt while driving.  Avoid raw meat, uncooked cheese, and litter boxes and soil used by cats.  Visit your dentist. Cleanings are okay. GET HELP RIGHT AWAY IF:   You have a temperature by mouth above 102 F (38.9 C), not controlled by medicine.  Fluid is coming from your vagina.  Blood is coming from your vagina. Light spotting is common, especially after sex (intercourse).  You have a bad smelling fluid (discharge) coming from the vagina. The fluid changes from clear to white.  You still feel sick to your stomach (nauseous).  You throw up (vomit) blood.  You lose or gain more than 2 pounds (0.9 kilograms) of weight in a week, or as suggested by your doctor.  Your face, hands, feet, or legs get puffy (swell).  You get exposed to German measles and have never had them.  You get exposed to fifth disease or chickenpox.  You have belly (abdominal) pain.  You have a bad headache that will not go away.  You have watery poop (diarrhea), pain when you pee (urinate), or have shortness of breath.  You start to have problems seeing (blurry or double vision).  You fall, are in a car accident, or have any kind of trauma.  There is mental or physical violence at home.  You have any concerns or worries during your pregnancy. MAKE SURE YOU:   Understand these instructions.  Will watch your condition.  Will get help  right away if you are not doing well or get worse. Document Released: 03/10/2010 Document Revised: 03/07/2012 Document Reviewed: 03/10/2010 [employer] Patient Information 2014 [employer].  

## 2013-07-03 ENCOUNTER — Encounter: Payer: Self-pay | Admitting: *Deleted

## 2013-07-21 ENCOUNTER — Ambulatory Visit (HOSPITAL_COMMUNITY)
Admission: RE | Admit: 2013-07-21 | Discharge: 2013-07-21 | Disposition: A | Discharge status: Home / Self Care | Payer: Medicaid Other | Source: Ambulatory Visit | Attending: Family Medicine | Admitting: Family Medicine

## 2013-07-21 VITALS — BP 116/73 | HR 95 | Wt 167.5 lb

## 2013-07-21 DIAGNOSIS — Z3689 Encounter for other specified antenatal screening: Secondary | ICD-10-CM | POA: Insufficient documentation

## 2013-07-21 DIAGNOSIS — O09529 Supervision of elderly multigravida, unspecified trimester: Secondary | ICD-10-CM | POA: Insufficient documentation

## 2013-07-21 DIAGNOSIS — O4402 Placenta previa specified as without hemorrhage, second trimester: Secondary | ICD-10-CM

## 2013-07-21 NOTE — Progress Notes (Signed)
Maureen Simpson  was seen today for an ultrasound appointment.  See full report in [facility].  Impression: Single IUP at 24 2/7 weeks Advanced maternal age.  Cell free fetal DNA - low risk for aneuploidy Normal interval anatomy Fetal growth is appropriate (59th %tile) Normal amniotic fluid volume  Low lying posterior placenta is again noted (1.8 cm from internal os)  Recommendations: Recommend follow-up ultrasound examination in 4 weeks to reevaluate placental location  Alpha Gula, MD

## 2013-07-26 ENCOUNTER — Encounter: Payer: Medicaid Other | Admitting: Obstetrics and Gynecology

## 2013-08-09 ENCOUNTER — Ambulatory Visit (INDEPENDENT_AMBULATORY_CARE_PROVIDER_SITE_OTHER): Payer: Medicaid Other | Admitting: Advanced Practice Midwife

## 2013-08-09 DIAGNOSIS — O09529 Supervision of elderly multigravida, unspecified trimester: Secondary | ICD-10-CM

## 2013-08-09 DIAGNOSIS — O4410 Placenta previa with hemorrhage, unspecified trimester: Secondary | ICD-10-CM

## 2013-08-09 DIAGNOSIS — O4442 Low lying placenta NOS or without hemorrhage, second trimester: Secondary | ICD-10-CM | POA: Insufficient documentation

## 2013-08-09 LAB — POCT URINALYSIS DIP (DEVICE)
Bilirubin Urine: NEGATIVE
Glucose, UA: NEGATIVE mg/dL
Hgb urine dipstick: NEGATIVE
Nitrite: NEGATIVE
Specific Gravity, Urine: 1.02 (ref 1.005–1.030)

## 2013-08-09 LAB — CBC
HCT: 30.7 % — ABNORMAL LOW (ref 36.0–46.0)
MCV: 89.5 fL (ref 78.0–100.0)
RBC: 3.43 MIL/uL — ABNORMAL LOW (ref 3.87–5.11)
WBC: 8.4 10*3/uL (ref 4.0–10.5)

## 2013-08-09 NOTE — Progress Notes (Signed)
Pulse: 85 28 week labs today. 1hr gtt due at 856

## 2013-08-09 NOTE — Progress Notes (Signed)
Doing well.  Good fetal movement, denies vaginal bleeding, LOF, regular contractions.  Reports carpel tunnel continues to bother her.  Wearing brace sometimes. Had some swelling of her jaw x2 this week after eating that resolved spontaneously and did not cause pain.  Reviewed PTL precautions, reasons to call/come to hospital.

## 2013-08-18 ENCOUNTER — Ambulatory Visit (HOSPITAL_COMMUNITY)
Admission: RE | Admit: 2013-08-18 | Discharge: 2013-08-18 | Disposition: A | Discharge status: Home / Self Care | Payer: Medicaid Other | Source: Ambulatory Visit | Attending: Family Medicine | Admitting: Family Medicine

## 2013-08-18 VITALS — BP 129/75 | HR 102 | Wt 174.0 lb

## 2013-08-18 DIAGNOSIS — O4402 Placenta previa specified as without hemorrhage, second trimester: Secondary | ICD-10-CM

## 2013-08-18 DIAGNOSIS — O4400 Placenta previa specified as without hemorrhage, unspecified trimester: Secondary | ICD-10-CM | POA: Insufficient documentation

## 2013-08-18 DIAGNOSIS — O09529 Supervision of elderly multigravida, unspecified trimester: Secondary | ICD-10-CM | POA: Insufficient documentation

## 2013-08-18 NOTE — Progress Notes (Signed)
Maureen Simpson  was seen today for an ultrasound appointment.  See full report in [facility].  Impression: Single IUP at 28 2/7 weeks Advanced maternal age.  Cell free fetal DNA - low risk for aneuploidy Normal interval anatomy Fetal growth is appropriate (40th %tile) Normal amniotic fluid volume  Posterior placenta- no longer low lying  Recommendations: Follow-up ultrasounds as clinically indicated.   Alpha Gula, MD

## 2013-08-23 ENCOUNTER — Encounter: Payer: Medicaid Other | Admitting: Advanced Practice Midwife

## 2013-08-30 ENCOUNTER — Encounter: Payer: Medicaid Other | Admitting: Family Medicine

## 2013-09-06 ENCOUNTER — Ambulatory Visit (INDEPENDENT_AMBULATORY_CARE_PROVIDER_SITE_OTHER): Payer: Medicaid Other | Admitting: Family Medicine

## 2013-09-06 ENCOUNTER — Encounter: Payer: Self-pay | Admitting: Family Medicine

## 2013-09-06 VITALS — BP 120/75 | Temp 97.5°F | Wt 178.2 lb

## 2013-09-06 DIAGNOSIS — O0930 Supervision of pregnancy with insufficient antenatal care, unspecified trimester: Secondary | ICD-10-CM

## 2013-09-06 DIAGNOSIS — O4442 Low lying placenta NOS or without hemorrhage, second trimester: Secondary | ICD-10-CM

## 2013-09-06 DIAGNOSIS — O4410 Placenta previa with hemorrhage, unspecified trimester: Secondary | ICD-10-CM

## 2013-09-06 DIAGNOSIS — Z3493 Encounter for supervision of normal pregnancy, unspecified, third trimester: Secondary | ICD-10-CM

## 2013-09-06 DIAGNOSIS — Z3483 Encounter for supervision of other normal pregnancy, third trimester: Secondary | ICD-10-CM

## 2013-09-06 LAB — POCT URINALYSIS DIP (DEVICE)
Bilirubin Urine: NEGATIVE
Glucose, UA: NEGATIVE mg/dL
Hgb urine dipstick: NEGATIVE
Ketones, ur: NEGATIVE mg/dL
Leukocytes, UA: NEGATIVE
Nitrite: NEGATIVE
Protein, ur: NEGATIVE mg/dL
Specific Gravity, Urine: 1.02 (ref 1.005–1.030)
Urobilinogen, UA: 1 mg/dL (ref 0.0–1.0)
pH: 7 (ref 5.0–8.0)

## 2013-09-06 NOTE — Progress Notes (Signed)
  Subjective:    Maureen Simpson is a 37 y.o. female being seen today for her obstetrical visit. She is at [redacted]w[redacted]d gestation. Patient reports no bleeding, no contractions, no cramping and no leaking. Fetal movement: normal.  Menstrual History: OB History   Grav Para Term Preterm Abortions TAB SAB Ect Mult Living   1                Patient's last menstrual period was 02/21/2013.    The following portions of the patient's history were reviewed and updated as appropriate: allergies, current medications, past family history, past medical history, past social history, past surgical history and problem list.  Review of Systems Pertinent items are noted in HPI.   Objective:    BP 120/75  Temp(Src) 97.5 F (36.4 C)  Wt 80.831 kg (178 lb 3.2 oz)  BMI 31.57 kg/m2  LMP 02/21/2013 FHT:  158 BPM  Uterine Size: 33 cm and size equals dates  Presentation: unsure     Assessment:  Maureen Simpson is a 37 y.o. G1P0 at [redacted]w[redacted]d by L=7 presnest for ROB  Plan:   Discussed with Patient:  -Plans to bottle feed.  All questions answered. -Continue prenatal vitamins. - Reviewed genetics screen (Quad screen / first trimester screen / serum integrated screen / full integrated screen done/ not done).   -Reviewed fetal kick counts (Pt to perform daily at a time when the baby is active, lie laterally with both hands on belly in quiet room and count all movements (hiccups, shoulder rolls, obvious kicks, etc); pt is to report to clinic or MAU for less than 10 movements felt in a one hour time period-pt told as soon as she counts 10 movements the count is complete.)  - Routine precautions discussed (depression, infection s/s).   Patient provided with all pertinent phone numbers for emergencies. - RTC for any VB, regular, painful cramps/ctxs occurring at a rate of >2/10 min, fever (100.5 or higher), n/v/d, any pain that is unresolving or worsening, LOF, decreased fetal movement, CP, SOB,  edema  Problems: Patient Active Problem List   Diagnosis Date Noted  . Low-lying placenta in second trimester 08/09/2013  . ASC-cannot exclude HGSIL on Pap 05/05/2013  . Advanced maternal age (AMA) in pregnancy 05/03/2013  . Supervision of normal pregnancy 05/03/2013  . Lipoma of arm 04/14/2012    To Do: 1. tdap next visit [ ]  Vaccines: Flu:  Tdap:  [ ]  BCM: Desires tubal  Edu: [x ] PTL precautions; [ ]  BF class; [ ]  childbirth class; [ ]   BF counseling;

## 2013-09-06 NOTE — Progress Notes (Signed)
Pulse- 86 Patient reports pain in her wrists from carpal tunnel

## 2013-09-06 NOTE — Patient Instructions (Signed)

## 2013-09-14 ENCOUNTER — Ambulatory Visit (HOSPITAL_COMMUNITY): Payer: Medicaid Other

## 2013-09-20 ENCOUNTER — Encounter: Payer: Medicaid Other | Admitting: Advanced Practice Midwife

## 2013-09-22 ENCOUNTER — Inpatient Hospital Stay (HOSPITAL_COMMUNITY)
Admission: AD | Admit: 2013-09-22 | Discharge: 2013-09-22 | Disposition: A | Discharge status: Home / Self Care | Payer: Medicaid Other | Source: Ambulatory Visit | Attending: Obstetrics & Gynecology | Admitting: Obstetrics & Gynecology

## 2013-09-22 ENCOUNTER — Encounter (HOSPITAL_COMMUNITY): Payer: Self-pay | Admitting: *Deleted

## 2013-09-22 DIAGNOSIS — O368190 Decreased fetal movements, unspecified trimester, not applicable or unspecified: Secondary | ICD-10-CM | POA: Insufficient documentation

## 2013-09-22 DIAGNOSIS — O368130 Decreased fetal movements, third trimester, not applicable or unspecified: Secondary | ICD-10-CM

## 2013-09-22 NOTE — MAU Note (Signed)
Patient states she has not felt her usual fetal movement today. Denies pain, bleeding or leaking.

## 2013-09-22 NOTE — MAU Provider Note (Signed)
  History     CSN: [id number]  Arrival date and time: 09/22/13 1727   None     Chief Complaint  Patient presents with  . Decreased Fetal Movement   HPI Maureen Simpson is a 37 y.o. G1P0 at [redacted]w[redacted]d prsents for eval of dec fetal movement from yesterday. Increased movement today but not back to baseline. Pt did not do a 2hr kick count. Pt states baby is moving more now that she is here. OB History   Grav Para Term Preterm Abortions TAB SAB Ect Mult Living   1               Past Medical History  Diagnosis Date  . Lipoma     right forearm  . No pertinent past medical history   . Asthma   . UTI (lower urinary tract infection)     Past Surgical History  Procedure Laterality Date  . Lipoma excision  05/26/2012    Procedure: EXCISION LIPOMA;  Surgeon: Atilano Ina, MD,FACS;  Location: [facility];  Service: General;  Laterality: Right;  excision of right proximal forearm lipoma    Family History  Problem Relation Age of Onset  . Hypertension Mother   . Hypertension Maternal Aunt   . Cancer Maternal Grandmother     colon     History  Substance Use Topics  . Smoking status: Former Smoker -- 0.25 packs/day    Types: Cigarettes    Quit date: 04/27/2013  . Smokeless tobacco: Never Used  . Alcohol Use: No     Comment: SOCIAL    Allergies: No Known Allergies  Prescriptions prior to admission  Medication Sig Dispense Refill  . acetaminophen (TYLENOL) 500 MG tablet Take 1,000 mg by mouth daily as needed for pain.      . diphenhydrAMINE (BENADRYL) 25 MG tablet Take 25 mg by mouth at bedtime as needed for allergies or sleep.      . Prenatal Vit-Fe Fumarate-FA (PRENATAL MULTIVITAMIN) TABS Take 1 tablet by mouth daily at 12 noon.  30 tablet  3    ROS No F/C, Sob, n/v, d/c, urinary sx. Dec FM, no LOF, No VB, No Ctx. No other complaints at this time Physical Exam   Blood pressure 112/71, pulse 94, temperature 98.5 F (36.9 C), temperature source Oral, resp.  rate 20, height 5\' 4"  (1.626 m), weight 81.557 kg (179 lb 12.8 oz), last menstrual period 02/21/2013.  Physical Exam  NST: 145 mult accels >15x15, no decels,  TOCO: No ctx  Limited third trim: SIUP, VTX, post plac, FHR 140, AFI 19.4  MAU Course  Procedures  MDM Reassuring NST and AFI. Resolved dec FM, D/C HOme  Assessment and Plan  Maureen Simpson is a 37 y.o. G1P0 at [redacted]w[redacted]d Presents for eval dec FM. Reassuring NST, AFI, and resolution of Dec FM. Discharged home with recs  For kick count for decreased FM. Pt states understanding. Return for PTL or dec FM.    Tawana Scale 09/22/2013, 6:53 PM

## 2013-09-27 ENCOUNTER — Encounter: Payer: Medicaid Other | Admitting: Advanced Practice Midwife

## 2013-09-27 LAB — POCT URINALYSIS DIP (DEVICE)
Bilirubin Urine: NEGATIVE
Ketones, ur: NEGATIVE mg/dL
Protein, ur: NEGATIVE mg/dL
Specific Gravity, Urine: 1.02 (ref 1.005–1.030)

## 2013-10-05 NOTE — MAU Provider Note (Signed)
Attestation of Attending Supervision of Fellow: Evaluation and management procedures were performed by the Fellow under my supervision and collaboration.  I have reviewed the Fellow's note and chart, and I agree with the management and plan.    

## 2013-10-11 ENCOUNTER — Encounter: Payer: Medicaid Other | Admitting: Advanced Practice Midwife

## 2013-10-25 ENCOUNTER — Encounter: Payer: Self-pay | Admitting: Family Medicine

## 2013-10-25 ENCOUNTER — Ambulatory Visit (INDEPENDENT_AMBULATORY_CARE_PROVIDER_SITE_OTHER): Payer: Medicaid Other | Admitting: Family Medicine

## 2013-10-25 DIAGNOSIS — R6889 Other general symptoms and signs: Secondary | ICD-10-CM

## 2013-10-25 DIAGNOSIS — O09529 Supervision of elderly multigravida, unspecified trimester: Secondary | ICD-10-CM

## 2013-10-25 DIAGNOSIS — Z23 Encounter for immunization: Secondary | ICD-10-CM

## 2013-10-25 DIAGNOSIS — Z3493 Encounter for supervision of normal pregnancy, unspecified, third trimester: Secondary | ICD-10-CM

## 2013-10-25 LAB — OB RESULTS CONSOLE GC/CHLAMYDIA
Chlamydia: NEGATIVE
Gonorrhea: NEGATIVE

## 2013-10-25 LAB — OB RESULTS CONSOLE GBS: GBS: NEGATIVE

## 2013-10-25 MED ORDER — TETANUS-DIPHTH-ACELL PERTUSSIS 5-2.5-18.5 LF-MCG/0.5 IM SUSP: 0.5000 mL | Freq: Once | INTRAMUSCULAR | Status: DC

## 2013-10-25 NOTE — Addendum Note (Signed)
Addended by: Franchot Mimes on: 10/25/2013 01:28 PM   Modules accepted: Orders

## 2013-10-25 NOTE — Progress Notes (Signed)
P=99  . Came to mau recently for decreased fetal movement. Declines flu shot today. Declines tdap today- may get it next week.

## 2013-10-25 NOTE — Patient Instructions (Signed)

## 2013-10-25 NOTE — Progress Notes (Signed)
Maureen Simpson is a 37 y.o. G1P0 at [redacted]w[redacted]d here for ROB visit.    +FM, no lof, no vb, no ctx  Discussed with Patient:  - Plans to bottle feed.  All questions answered. - Continue prenatal vitamins. - Reviewed fetal kick counts Pt to perform daily at a time when the baby is active, lie laterally with both hands on belly in quiet room and count all movements (hiccups, shoulder rolls, obvious kicks, etc); pt is to report to clinic MAU for less than 10 movements felt in a 2 hour time period-pt told as soon as she counts 10 movements the count is complete.  - Routine precautions discussed (depression, infection s/s).   Patient provided with all pertinent phone numbers for emergencies. - RTC for any VB, regular, painful cramps/ctxs occurring at a rate of >2/10 min, fever (100.5 or higher), n/v/d, any pain that is unresolving or worsening, LOF, decreased fetal movement, CP, SOB, edema -RTC in one week for next visit.  Problems: Patient Active Problem List   Diagnosis Date Noted  . Low-lying placenta in second trimester 08/09/2013  . ASC-cannot exclude HGSIL on Pap 05/05/2013  . Advanced maternal age (AMA) in pregnancy 05/03/2013  . Supervision of normal pregnancy 05/03/2013  . Lipoma of arm 04/14/2012    To Do: 1. tdap 2. GBS today 3. Flu shot next vist  [ ]  Vaccines: Flu:   Tdap: 10/29 [ ]  BCM: mirena [ ]  Readiness: baby has a place to sleep, car seat, other baby necessities.  Edu: [x ] TL precautions; [ ]  BF class; [ ]  childbirth class; [ ]   BF counseling;

## 2013-10-26 LAB — GC/CHLAMYDIA PROBE AMP: GC Probe RNA: NEGATIVE

## 2013-10-28 LAB — CULTURE, BETA STREP (GROUP B ONLY)

## 2013-10-31 ENCOUNTER — Encounter: Payer: Self-pay | Admitting: Family Medicine

## 2013-11-01 ENCOUNTER — Encounter: Payer: Self-pay | Admitting: *Deleted

## 2013-11-01 ENCOUNTER — Ambulatory Visit (INDEPENDENT_AMBULATORY_CARE_PROVIDER_SITE_OTHER): Payer: Medicaid Other | Admitting: Advanced Practice Midwife

## 2013-11-01 DIAGNOSIS — O09529 Supervision of elderly multigravida, unspecified trimester: Secondary | ICD-10-CM

## 2013-11-01 DIAGNOSIS — O4410 Placenta previa with hemorrhage, unspecified trimester: Secondary | ICD-10-CM

## 2013-11-01 NOTE — Progress Notes (Signed)
Pulse- 90 Patient reports occasional pelvic pressure

## 2013-11-01 NOTE — Progress Notes (Signed)
Doing well.  Good fetal movement, denies vaginal bleeding, LOF, regular contractions.  Reviewed signs of labor.  

## 2013-11-08 ENCOUNTER — Encounter: Payer: Medicaid Other | Admitting: Family Medicine

## 2013-11-08 ENCOUNTER — Ambulatory Visit (INDEPENDENT_AMBULATORY_CARE_PROVIDER_SITE_OTHER): Payer: Medicaid Other | Admitting: Obstetrics and Gynecology

## 2013-11-08 VITALS — BP 134/81 | Temp 98.3°F | Wt 185.3 lb

## 2013-11-08 DIAGNOSIS — O09529 Supervision of elderly multigravida, unspecified trimester: Secondary | ICD-10-CM

## 2013-11-08 DIAGNOSIS — Z3493 Encounter for supervision of normal pregnancy, unspecified, third trimester: Secondary | ICD-10-CM

## 2013-11-08 LAB — POCT URINALYSIS DIP (DEVICE)
Bilirubin Urine: NEGATIVE
Glucose, UA: NEGATIVE mg/dL
Hgb urine dipstick: NEGATIVE
Leukocytes, UA: NEGATIVE
Nitrite: POSITIVE — AB
Urobilinogen, UA: 1 mg/dL (ref 0.0–1.0)

## 2013-11-08 NOTE — Progress Notes (Signed)
Doing well. Plans bottlefeed still. Occ mild UC and RLP. Good FM. S/sx labor reviewed.

## 2013-11-08 NOTE — Patient Instructions (Signed)
Vaginal Bleeding During Pregnancy, Third Trimester °A small amount of bleeding (spotting) is relatively common in pregnancy. Sometimes bleeding may be "normal." Bleeding in the third trimester can be very serious for the mother and the baby, and should be reported to your caregiver right away. It is very important to follow your caregiver's instructions. °CAUSES °Possible causes of bleeding during the third trimester: °· The placenta may be partially covering or completely covering the opening to the cervix (placenta previa). °· The placenta may have separated from the uterus (abruption of the placenta). °· There may be an infection or growth on the cervix. °· You may be starting labor, called discharging of the mucus plug. °· The placenta may grow into the muscle layer of the uterus (placenta accreta). °DIAGNOSIS  °Your caregiver may do: °· Pelvic exam in the delivery room, called a double set up (being ready to do an emergency cesarean delivery, if necessary). °· Blood tests, to see if you are anemic (not having enough red blood cells). °· Ultrasound and fetal monitoring, to see if the baby is having problems. °· Ultrasound, to find out why you are bleeding. °TREATMENT  °· You may need to remain in the hospital. °· You may be given an IV (intravenous) while you are in the hospital. °· You may be put on strict bed rest. °· You may need a blood transfusion. °· You may be placed on oxygen. °· The baby may need to be delivered immediately. °HOME CARE INSTRUCTIONS  °· Your caregiver may order bed rest (getting up to go to the bathroom only). At this time, you may need to make arrangements for the care of children and for other responsibilities. °· Keep track of the number of pads you use each day and how soaked (saturated) they are. Write this down. °· Do not use tampons. Do not douche. °· Do not have sexual intercourse or any sexual activity that may cause an orgasm, until approved by your caregiver. °· Follow your  caregiver's advice about lifting, driving, physical and social activities. °· Eat a balanced and nutritious diet. °· Get plenty of rest and sleep. °· Do not drink alcohol or smoke. °SEEK IMMEDIATE MEDICAL CARE IF:  °· You experience severe cramps or pain in your back or belly (abdomen). °· You have an oral temperature above 102° F (38.9° C), not controlled by medicine. °· You develop chills. °· You have a gush of fluid from the vagina. °· You pass large clots or tissue. Save any tissue for your caregiver to inspect. °· Your bleeding increases or you become light-headed or weak. °· You pass out. °· You feel less movement or no movement of the baby. °Document Released: 03/06/2003 Document Revised: 03/07/2012 Document Reviewed: 11/11/2009 °ExitCare® Patient Information ©2014 [employer]. ° °

## 2013-11-08 NOTE — Progress Notes (Signed)
P= 109  Pt c/o lower abd pain, desires cervical exam today.

## 2013-11-14 ENCOUNTER — Encounter: Payer: Self-pay | Admitting: *Deleted

## 2013-11-14 ENCOUNTER — Encounter (HOSPITAL_COMMUNITY): Payer: Self-pay | Admitting: *Deleted

## 2013-11-14 ENCOUNTER — Inpatient Hospital Stay (HOSPITAL_COMMUNITY)
Admission: AD | Admit: 2013-11-14 | Discharge: 2013-11-18 | DRG: 775 | Disposition: A | Discharge status: Home / Self Care | Payer: Medicaid Other | Source: Ambulatory Visit | Attending: Obstetrics and Gynecology | Admitting: Obstetrics and Gynecology

## 2013-11-14 DIAGNOSIS — O4442 Low lying placenta NOS or without hemorrhage, second trimester: Secondary | ICD-10-CM

## 2013-11-14 DIAGNOSIS — O4290 Premature rupture of membranes, unspecified as to length of time between rupture and onset of labor, unspecified weeks of gestation: Principal | ICD-10-CM | POA: Diagnosis present

## 2013-11-14 LAB — CBC
HCT: 32.8 % — ABNORMAL LOW (ref 36.0–46.0)
Hemoglobin: 11.6 g/dL — ABNORMAL LOW (ref 12.0–15.0)
MCH: 31.2 pg (ref 26.0–34.0)
MCV: 88.2 fL (ref 78.0–100.0)
Platelets: 253 10*3/uL (ref 150–400)
RBC: 3.72 MIL/uL — ABNORMAL LOW (ref 3.87–5.11)
WBC: 9.4 10*3/uL (ref 4.0–10.5)

## 2013-11-14 MED ORDER — ACETAMINOPHEN 325 MG PO TABS: 650.0000 mg | ORAL_TABLET | ORAL | Status: DC | PRN

## 2013-11-14 MED ORDER — FENTANYL CITRATE 0.05 MG/ML IJ SOLN
100.0000 ug | INTRAMUSCULAR | Status: DC | PRN
  Administered 2013-11-15 (×4): 100 ug via INTRAVENOUS
  Filled 2013-11-14 (×4): qty 2

## 2013-11-14 MED ORDER — LACTATED RINGERS IV SOLN
INTRAVENOUS | Status: DC
  Administered 2013-11-14 – 2013-11-16 (×7): via INTRAVENOUS

## 2013-11-14 MED ORDER — ONDANSETRON HCL 4 MG/2ML IJ SOLN: 4.0000 mg | Freq: Four times a day (QID) | INTRAMUSCULAR | Status: DC | PRN

## 2013-11-14 MED ORDER — LIDOCAINE HCL (PF) 1 % IJ SOLN
30.0000 mL | INTRAMUSCULAR | Status: DC | PRN
  Filled 2013-11-14 (×2): qty 30

## 2013-11-14 MED ORDER — MISOPROSTOL 25 MCG QUARTER TABLET
25.0000 ug | ORAL_TABLET | ORAL | Status: DC | PRN
  Administered 2013-11-14: 25 ug via ORAL
  Filled 2013-11-14: qty 0.25
  Filled 2013-11-14: qty 1

## 2013-11-14 MED ORDER — IBUPROFEN 600 MG PO TABS
600.0000 mg | ORAL_TABLET | Freq: Four times a day (QID) | ORAL | Status: DC | PRN
  Administered 2013-11-16: 600 mg via ORAL
  Filled 2013-11-14: qty 1

## 2013-11-14 MED ORDER — OXYTOCIN 40 UNITS IN LACTATED RINGERS INFUSION - SIMPLE MED
62.5000 mL/h | INTRAVENOUS | Status: DC
  Filled 2013-11-14: qty 1000

## 2013-11-14 MED ORDER — OXYCODONE-ACETAMINOPHEN 5-325 MG PO TABS
1.0000 | ORAL_TABLET | ORAL | Status: DC | PRN
Start: 2013-11-14 — End: 2013-11-16

## 2013-11-14 MED ORDER — TERBUTALINE SULFATE 1 MG/ML IJ SOLN: 0.2500 mg | Freq: Once | INTRAMUSCULAR | Status: AC | PRN

## 2013-11-14 MED ORDER — OXYTOCIN BOLUS FROM INFUSION: 500.0000 mL | INTRAVENOUS | Status: DC

## 2013-11-14 MED ORDER — CITRIC ACID-SODIUM CITRATE 334-500 MG/5ML PO SOLN: 30.0000 mL | ORAL | Status: DC | PRN

## 2013-11-14 MED ORDER — LACTATED RINGERS IV SOLN
500.0000 mL | INTRAVENOUS | Status: DC | PRN
  Administered 2013-11-15 – 2013-11-16 (×4): 500 mL via INTRAVENOUS

## 2013-11-14 NOTE — MAU Note (Signed)
Pt leaking clear fluid since 1930. Denies pain.  No problems with pregnancy.

## 2013-11-14 NOTE — Progress Notes (Signed)
Maureen Simpson is a 37 y.o. G1P0 at [redacted]w[redacted]d admitted for rupture of membranes  Subjective: Pt comfortable in bed, family at bedside for support.   Objective: BP 114/64  Pulse 89  Temp(Src) 98.5 F (36.9 C) (Oral)  Resp 18  Ht 5\' 3"  (1.6 m)  Wt 84.369 kg (186 lb)  BMI 32.96 kg/m2  LMP 02/21/2013      FHT:  FHR: 140 bpm, variability: moderate,  accelerations:  Present,  decelerations:  Present occasional FHR decels lasting 1.5-2 minutes down to 125-130, some following contractions appearing late, others spontaneous UC:   occasional SVE:   Dilation: Fingertip Effacement (%): 80 Station: -3 Exam by:: l. cresenzo rn  Labs: Lab Results  Component Value Date   WBC 9.4 11/14/2013   HGB 11.6* 11/14/2013   HCT 32.8* 11/14/2013   MCV 88.2 11/14/2013   PLT 253 11/14/2013    Assessment / Plan: PROM  Labor: Augmentation of labor using Cytotec PO x1 Preeclampsia:  n/a Fetal Wellbeing:  Category I Overall Category I Pain Control:  Labor support without medications I/D:  n/a Anticipated MOD:  NSVD  LEFTWICH-KIRBY, Royale Lennartz 11/14/2013, 11:35 PM

## 2013-11-15 ENCOUNTER — Inpatient Hospital Stay (HOSPITAL_COMMUNITY): Payer: Medicaid Other | Admitting: Anesthesiology

## 2013-11-15 ENCOUNTER — Other Ambulatory Visit: Payer: Medicaid Other

## 2013-11-15 ENCOUNTER — Encounter (HOSPITAL_COMMUNITY): Payer: Medicaid Other | Admitting: Anesthesiology

## 2013-11-15 MED ORDER — FENTANYL 2.5 MCG/ML BUPIVACAINE 1/10 % EPIDURAL INFUSION (WH - ANES)
INTRAMUSCULAR | Status: DC | PRN
  Administered 2013-11-15: 14 mL/h via EPIDURAL

## 2013-11-15 MED ORDER — TERBUTALINE SULFATE 1 MG/ML IJ SOLN: 0.2500 mg | Freq: Once | INTRAMUSCULAR | Status: AC | PRN

## 2013-11-15 MED ORDER — PHENYLEPHRINE 40 MCG/ML (10ML) SYRINGE FOR IV PUSH (FOR BLOOD PRESSURE SUPPORT)
80.0000 ug | PREFILLED_SYRINGE | INTRAVENOUS | Status: DC | PRN
  Filled 2013-11-15: qty 2
  Filled 2013-11-15: qty 10

## 2013-11-15 MED ORDER — DIPHENHYDRAMINE HCL 50 MG/ML IJ SOLN: 12.5000 mg | INTRAMUSCULAR | Status: DC | PRN

## 2013-11-15 MED ORDER — FENTANYL 2.5 MCG/ML BUPIVACAINE 1/10 % EPIDURAL INFUSION (WH - ANES)
14.0000 mL/h | INTRAMUSCULAR | Status: DC | PRN
  Administered 2013-11-15 – 2013-11-16 (×2): 14 mL/h via EPIDURAL
  Filled 2013-11-15 (×5): qty 125

## 2013-11-15 MED ORDER — OXYTOCIN 40 UNITS IN LACTATED RINGERS INFUSION - SIMPLE MED
1.0000 m[IU]/min | INTRAVENOUS | Status: DC
  Administered 2013-11-15: 2 m[IU]/min via INTRAVENOUS

## 2013-11-15 MED ORDER — PHENYLEPHRINE 40 MCG/ML (10ML) SYRINGE FOR IV PUSH (FOR BLOOD PRESSURE SUPPORT)
80.0000 ug | PREFILLED_SYRINGE | INTRAVENOUS | Status: DC | PRN
  Filled 2013-11-15: qty 2

## 2013-11-15 MED ORDER — EPHEDRINE 5 MG/ML INJ
10.0000 mg | INTRAVENOUS | Status: DC | PRN
  Filled 2013-11-15: qty 4
  Filled 2013-11-15: qty 2

## 2013-11-15 MED ORDER — EPHEDRINE 5 MG/ML INJ
10.0000 mg | INTRAVENOUS | Status: DC | PRN
  Filled 2013-11-15: qty 2

## 2013-11-15 MED ORDER — LACTATED RINGERS IV SOLN
500.0000 mL | Freq: Once | INTRAVENOUS | Status: AC
  Administered 2013-11-15: 08:00:00 via INTRAVENOUS

## 2013-11-15 MED ORDER — LIDOCAINE HCL (PF) 1 % IJ SOLN
INTRAMUSCULAR | Status: DC | PRN
  Administered 2013-11-15 (×2): 9 mL

## 2013-11-15 NOTE — Progress Notes (Signed)
Maureen Simpson is a 37 y.o. G1P0 at [redacted]w[redacted]d admitted for rupture of membranes  Subjective: Pt feels pressure, pain controlled with epidural.   Objective: BP 129/69  Pulse 88  Temp(Src) 98.5 F (36.9 C) (Oral)  Resp 18  Ht 5\' 3"  (1.6 m)  Wt 84.369 kg (186 lb)  BMI 32.96 kg/m2  SpO2 100%  LMP 02/21/2013      FHT:  FHR: 145 bpm, variability: moderate,  accelerations:  Present,  decelerations:  Present rare variables. UC:   irregular SVE:   Dilation: 2.5 Effacement (%): 90 Station: -2 Exam by:: J.Thornton, RN  Labs: Lab Results  Component Value Date   WBC 9.4 11/14/2013   HGB 11.6* 11/14/2013   HCT 32.8* 11/14/2013   MCV 88.2 11/14/2013   PLT 253 11/14/2013    Assessment / Plan: Induction of labor due to ROM,  progressing well on pitocin and cytotec.   Labor: Progressing normally Preeclampsia:  no signs or symptoms of toxicity Fetal Wellbeing:  Category I Pain Control:  Epidural I/D:  n/a Anticipated MOD:  NSVD  Maureen Simpson 11/15/2013, 12:03 PM

## 2013-11-15 NOTE — Progress Notes (Signed)
Maureen Simpson is a 37 y.o. G1P0 at [redacted]w[redacted]d admitted for PROM.  Subjective: Pt breathing with contractions.  Family in room for support.   Objective: BP 139/96  Pulse 78  Temp(Src) 98.2 F (36.8 C) (Oral)  Resp 18  Ht 5\' 3"  (1.6 m)  Wt 84.369 kg (186 lb)  BMI 32.96 kg/m2  LMP 02/21/2013      FHT:  FHR: 140 bpm, variability: moderate,  accelerations:  Present,  decelerations:  Absent UC:   regular, every 3 minutes SVE:   Dilation: 1 Effacement (%): 100 Station: -2 Exam by:: h stone rnc  Labs: Lab Results  Component Value Date   WBC 9.4 11/14/2013   HGB 11.6* 11/14/2013   HCT 32.8* 11/14/2013   MCV 88.2 11/14/2013   PLT 253 11/14/2013    Assessment / Plan: Augmentation of labor, progressing well  Labor: Progressing normally Preeclampsia:  n/a Fetal Wellbeing:  Category I Pain Control:  Fentanyl I/D:  n/a Anticipated MOD:  NSVD  LEFTWICH-KIRBY, Narek Kniss 11/15/2013, 7:23 AM

## 2013-11-15 NOTE — Anesthesia Preprocedure Evaluation (Signed)
Anesthesia Evaluation  Patient identified by MRN, date of birth, ID band Patient awake    Reviewed: Allergy & Precautions, H&P , NPO status , Patient's Chart, lab work & pertinent test results  Airway Mallampati: II TM Distance: >3 FB Neck ROM: full    Dental no notable dental hx.    Pulmonary former smoker,    Pulmonary exam normal       Cardiovascular negative cardio ROS      Neuro/Psych negative neurological ROS  negative psych ROS   GI/Hepatic negative GI ROS, Neg liver ROS,   Endo/Other  negative endocrine ROS  Renal/GU negative Renal ROS  negative genitourinary   Musculoskeletal negative musculoskeletal ROS (+)   Abdominal Normal abdominal exam  (+)   Peds  Hematology negative hematology ROS (+)   Anesthesia Other Findings   Reproductive/Obstetrics (+) Pregnancy                           Anesthesia Physical Anesthesia Plan  ASA: II  Anesthesia Plan: Epidural   Post-op Pain Management:    Induction:   Airway Management Planned:   Additional Equipment:   Intra-op Plan:   Post-operative Plan:   Informed Consent: I have reviewed the patients History and Physical, chart, labs and discussed the procedure including the risks, benefits and alternatives for the proposed anesthesia with the patient or authorized representative who has indicated his/her understanding and acceptance.     Plan Discussed with:   Anesthesia Plan Comments:         Anesthesia Quick Evaluation

## 2013-11-15 NOTE — Progress Notes (Signed)
SALEM Simpson is a 37 y.o. G1P0 at [redacted]w[redacted]d admitted for rupture of membranes  Subjective:  Called by nursing for strip concerns.    Objective: BP 115/65  Pulse 90  Temp(Src) 98.1 F (36.7 C) (Oral)  Resp 18  Ht 5\' 3"  (1.6 m)  Wt 84.369 kg (186 lb)  BMI 32.96 kg/m2  SpO2 100%  LMP 02/21/2013   Total I/O In: -  Out: 550 [Urine:550]  FHT:  FHR: 150 bpm, variability: moderate mostly but some periods of minimal,  accelerations:  Present,  decelerations:  Present late decels  UC:   regular, every 2 minutes SVE:   Dilation: 4 Effacement (%): 90 Station: -2 Exam by:: Maureen Paris, RN  Labs: Lab Results  Component Value Date   WBC 9.4 11/14/2013   HGB 11.6* 11/14/2013   HCT 32.8* 11/14/2013   MCV 88.2 11/14/2013   PLT 253 11/14/2013    Assessment / Plan: IOL for ROM and now with signs of fetal distress on tracing.   Labor: will stop the pitocin and bolus the pt and then plan to restart if strip improves Fetal Wellbeing:  Category II Pain Control:  Epidural I/D:  n/a Anticipated MOD:  NSVD  Maureen Simpson 11/15/2013, 3:50 PM

## 2013-11-15 NOTE — Progress Notes (Signed)
Maureen Simpson is a 37 y.o. G1P0 at [redacted]w[redacted]d admitted for rupture of membranes at approximately 12:30am 11/19.  Subjective: Pt indicating some upper back pain related to positioning. No other pain or pressure.   Objective: BP 120/56  Pulse 86  Temp(Src) 99.5 F (37.5 C) (Oral)  Resp 18  Ht 5\' 3"  (1.6 m)  Wt 84.369 kg (186 lb)  BMI 32.96 kg/m2  SpO2 100%  LMP 02/21/2013   Total I/O In: -  Out: 550 [Urine:550]  FHT:  FHR: 150 bpm, variability: moderate,  accelerations:  Present,  decelerations:  Absent UC:   regular, every 6-7 minutes SVE:   Dilation: 5 Effacement (%): 80 Station: -2 Exam by:: L. lima, RN  Labs: Lab Results  Component Value Date   WBC 9.4 11/14/2013   HGB 11.6* 11/14/2013   HCT 32.8* 11/14/2013   MCV 88.2 11/14/2013   PLT 253 11/14/2013    Assessment / Plan: IOL for ROM progressing slowly, no signs of fetal distress.   Labor: Restart pitocin at 106mu/min and monitor for cervical change.  Fetal Wellbeing:  Category I Pain Control:  Epidural I/D:  n/a Anticipated MOD:  NSVD  [name] 11/15/2013, 6:44 PM  I have seen and examined this patient and agree with above documentation in the resident's note. Pt with improved fetal tracing after being off pitocin. Period of fetal tachycardia to the 170s now resolved. Last 30 min with cat I tracing and 15x15s. Will restart pitocin at half dose.   Rulon Abide, M.D. [facility] Fellow 11/15/2013 6:50 PM

## 2013-11-15 NOTE — Progress Notes (Signed)
Maureen Simpson is a 37 y.o. G1P0 at [redacted]w[redacted]d   Subjective: Comfortable with epidural  Objective: BP 120/70  Pulse 83  Temp(Src) 99.2 F (37.3 C) (Axillary)  Resp 18  Ht 5\' 3"  (1.6 m)  Wt 84.369 kg (186 lb)  BMI 32.96 kg/m2  SpO2 100%  LMP 02/21/2013 I/O last 3 completed shifts: In: -  Out: 550 [Urine:550]    FHT:  FHR: 150s bpm, variability: moderate,  accelerations:  Present,  decelerations:  Absent- occ mi variables UC:   irregular, every 2-6 minutes with Pitocin @ 38mu/min SVE:   Dilation:  (documented on wrong pt) Effacement (%): 80 Station: -2 Exam by:: L. lima, RN Cx not reexamined- last 5/80/-2  Labs: Lab Results  Component Value Date   WBC 9.4 11/14/2013   HGB 11.6* 11/14/2013   HCT 32.8* 11/14/2013   MCV 88.2 11/14/2013   PLT 253 11/14/2013    Assessment / Plan: IUP at term PROM x 23hrs- afebrile Inadequate ctx  Will continue to increase Pitocin to achieve adequate labor Examine cx when ctx more reg  Cam Hai 11/15/2013, 11:31 PM

## 2013-11-15 NOTE — H&P (Signed)
Maureen Simpson is a 37 y.o. female G1P0 pt of [facility] presenting for PROM without onset of regular contractions.  Pt reports she had leakage of clear fluid that was running down her legs onto the floor immediately prior to coming to the hospital.  This pregnancy has been uncomplicated except for low-lying placenta which resolved on U/S 06/2013 and advanced maternal age.  She reports good fetal movement, denies vaginal bleeding, vaginal itching/burning, urinary symptoms, h/a, dizziness, n/v, or fever/chills.    Maternal Medical History:  Reason for admission: Rupture of membranes.   Contractions: Onset was less than 1 hour ago.   Frequency: rare.   Perceived severity is mild.    Fetal activity: Perceived fetal activity is normal.   Last perceived fetal movement was within the past hour.    Prenatal complications: no prenatal complications Prenatal Complications - Diabetes: none.    OB History   Grav Para Term Preterm Abortions TAB SAB Ect Mult Living   1              Past Medical History  Diagnosis Date  . Lipoma     right forearm  . No pertinent past medical history   . Asthma   . UTI (lower urinary tract infection)    Past Surgical History  Procedure Laterality Date  . Lipoma excision  05/26/2012    Procedure: EXCISION LIPOMA;  Surgeon: Atilano Ina, MD,FACS;  Location: [facility];  Service: General;  Laterality: Right;  excision of right proximal forearm lipoma   Family History: family history includes Cancer in her maternal grandmother; Hypertension in her maternal aunt and mother. Social History:  reports that she quit smoking about 7 months ago. Her smoking use included Cigarettes. She smoked 0.25 packs per day. She has never used smokeless tobacco. She reports that she does not drink alcohol or use illicit drugs.   Prenatal Transfer Tool  Maternal Diabetes: No Genetic Screening: Normal Maternal Ultrasounds/Referrals: Normal Fetal Ultrasounds or other  Referrals:  None Maternal Substance Abuse:  No Significant Maternal Medications:  None Significant Maternal Lab Results:  Lab values include: Group B Strep negative Other Comments:  None  ROS  Dilation: Fingertip Effacement (%): 80 Station: -3 Exam by:: l. cresenzo rn Blood pressure 114/64, pulse 89, temperature 98.5 F (36.9 C), temperature source Oral, resp. rate 18, height 5\' 3"  (1.6 m), weight 84.369 kg (186 lb), last menstrual period 02/21/2013. Exam Physical Exam  Prenatal labs: ABO, Rh: A/POS/-- (05/07 1058) Antibody: NEG (05/07 1058) Rubella: 9.50 (05/07 1058) RPR: NON REAC (08/13 0917)  HBsAg: NEGATIVE (05/07 1058)  HIV: NON REACTIVE (08/13 0917)  GBS: Negative (10/29 0000)  GTT: 1 hour 122  Assessment/Plan: PROM without onset of active labor GBS negative  Admit to [employer] Cytotec PO dose x1 Anticipate NSVD   LEFTWICH-KIRBY, Demitrus Francisco 11/15/2013, 1:46 AM

## 2013-11-15 NOTE — Progress Notes (Signed)
I have seen and examined this patient and agree with above documentation in the resident's note.   Rulon Abide, M.D. [facility] Fellow 11/15/2013 12:18 PM

## 2013-11-15 NOTE — Progress Notes (Signed)
Maureen Simpson is a 37 y.o. G1P0 at [redacted]w[redacted]d  admitted for rupture of membranes  Subjective:  Pt comfortable not feeling much with epidural. +FM.   Objective: BP 124/70  Pulse 91  Temp(Src) 98.1 F (36.7 C) (Oral)  Resp 18  Ht 5\' 3"  (1.6 m)  Wt 84.369 kg (186 lb)  BMI 32.96 kg/m2  SpO2 100%  LMP 02/21/2013      FHT:  FHR: 145 bpm, variability: moderate,  accelerations:  Present,  decelerations:  Present variables UC:   regular, every 23 minutes SVE:   Dilation: 4 Effacement (%): 90 Station: -2 Exam by:: Currie Paris, RN  Labs: Lab Results  Component Value Date   WBC 9.4 11/14/2013   HGB 11.6* 11/14/2013   HCT 32.8* 11/14/2013   MCV 88.2 11/14/2013   PLT 253 11/14/2013    Assessment / Plan: IOL for PROM progressing slowly.   Labor: cont pitocin. now at 72mu/min and making change Fetal Wellbeing:  Category II periods of min variability and periods with variable decels but scalp stim noted at time of check.  Pain Control:  Epidural I/D:  n/a Anticipated MOD:  NSVD  Frimy Uffelman L 11/15/2013, 2:37 PM

## 2013-11-15 NOTE — Anesthesia Procedure Notes (Signed)
Epidural Patient location during procedure: OB Start time: 11/15/2013 8:00 AM End time: 11/15/2013 8:04 AM  Staffing Anesthesiologist: Leilani Able Performed by: anesthesiologist   Preanesthetic Checklist Completed: patient identified, surgical consent, pre-op evaluation, timeout performed, IV checked, risks and benefits discussed and monitors and equipment checked  Epidural Patient position: sitting Prep: site prepped and draped and DuraPrep Patient monitoring: continuous pulse ox and blood pressure Approach: midline Injection technique: LOR air  Needle:  Needle type: Tuohy  Needle gauge: 17 G Needle length: 9 cm and 9 Needle insertion depth: 7 cm Catheter type: closed end flexible Catheter size: 19 Gauge Catheter at skin depth: 12 cm Test dose: negative and Other  Assessment Sensory level: T9 Events: blood not aspirated, injection not painful, no injection resistance, negative IV test and no paresthesia  Additional Notes Reason for block:procedure for pain

## 2013-11-15 NOTE — Progress Notes (Signed)
Maureen Simpson is a 37 y.o. G1P0 at [redacted]w[redacted]d admitted for rupture of membranes  Subjective: Pt sitting up in bed, rates ctx pain 10/10.  Family in room for support.   Objective: BP 114/64  Pulse 89  Temp(Src) 98.5 F (36.9 C) (Oral)  Resp 18  Ht 5\' 3"  (1.6 m)  Wt 84.369 kg (186 lb)  BMI 32.96 kg/m2  LMP 02/21/2013      FHT:  FHR: 135 bpm, variability: moderate,  accelerations:  Present,  decelerations:  Absent UC:   regular, every 5-6 minutes SVE:   Dilation: 1 Effacement (%): 100 Station: -3 Exam by:: l. leftwich kirby cnm  Labs: Lab Results  Component Value Date   WBC 9.4 11/14/2013   HGB 11.6* 11/14/2013   HCT 32.8* 11/14/2013   MCV 88.2 11/14/2013   PLT 253 11/14/2013    Assessment / Plan: Augmentation of labor, progressing well  Labor: Plan to start Pitocin at this time. Preeclampsia:  n/a Fetal Wellbeing:  Category I Pain Control:  Labor support without medications.  Discussed pain management options with pt.  Pt plans to try IV pain medication but desires epidural at some point.  May have epidural when desired.  I/D:  n/a Anticipated MOD:  NSVD  LEFTWICH-KIRBY, Maureen Simpson 11/15/2013, 2:28 AM

## 2013-11-16 ENCOUNTER — Encounter (HOSPITAL_COMMUNITY): Payer: Self-pay

## 2013-11-16 MED ORDER — ONDANSETRON HCL 4 MG/2ML IJ SOLN: 4.0000 mg | INTRAMUSCULAR | Status: DC | PRN

## 2013-11-16 MED ORDER — SIMETHICONE 80 MG PO CHEW: 80.0000 mg | CHEWABLE_TABLET | ORAL | Status: DC | PRN

## 2013-11-16 MED ORDER — PRENATAL MULTIVITAMIN CH
1.0000 | ORAL_TABLET | Freq: Every day | ORAL | Status: DC
  Administered 2013-11-18: 1 via ORAL
  Filled 2013-11-16: qty 1

## 2013-11-16 MED ORDER — SENNOSIDES-DOCUSATE SODIUM 8.6-50 MG PO TABS
2.0000 | ORAL_TABLET | ORAL | Status: DC
  Administered 2013-11-16 – 2013-11-18 (×2): 2 via ORAL
  Filled 2013-11-16 (×2): qty 2

## 2013-11-16 MED ORDER — MISOPROSTOL 200 MCG PO TABS
ORAL_TABLET | ORAL | Status: AC
  Filled 2013-11-16: qty 4

## 2013-11-16 MED ORDER — LANOLIN HYDROUS EX OINT: TOPICAL_OINTMENT | CUTANEOUS | Status: DC | PRN

## 2013-11-16 MED ORDER — WITCH HAZEL-GLYCERIN EX PADS: 1.0000 "application " | MEDICATED_PAD | CUTANEOUS | Status: DC | PRN

## 2013-11-16 MED ORDER — BENZOCAINE-MENTHOL 20-0.5 % EX AERO
1.0000 "application " | INHALATION_SPRAY | CUTANEOUS | Status: DC | PRN
  Filled 2013-11-16: qty 56

## 2013-11-16 MED ORDER — IBUPROFEN 600 MG PO TABS
600.0000 mg | ORAL_TABLET | Freq: Four times a day (QID) | ORAL | Status: DC
  Administered 2013-11-16 – 2013-11-18 (×6): 600 mg via ORAL
  Filled 2013-11-16 (×7): qty 1

## 2013-11-16 MED ORDER — ONDANSETRON HCL 4 MG PO TABS: 4.0000 mg | ORAL_TABLET | ORAL | Status: DC | PRN

## 2013-11-16 MED ORDER — ZOLPIDEM TARTRATE 5 MG PO TABS: 5.0000 mg | ORAL_TABLET | Freq: Every evening | ORAL | Status: DC | PRN

## 2013-11-16 MED ORDER — DIBUCAINE 1 % RE OINT: 1.0000 "application " | TOPICAL_OINTMENT | RECTAL | Status: DC | PRN

## 2013-11-16 MED ORDER — DIPHENHYDRAMINE HCL 25 MG PO CAPS: 25.0000 mg | ORAL_CAPSULE | Freq: Four times a day (QID) | ORAL | Status: DC | PRN

## 2013-11-16 MED ORDER — OXYCODONE-ACETAMINOPHEN 5-325 MG PO TABS
1.0000 | ORAL_TABLET | ORAL | Status: DC | PRN
  Administered 2013-11-16 – 2013-11-17 (×4): 1 via ORAL
  Filled 2013-11-16 (×4): qty 1

## 2013-11-16 MED ORDER — TETANUS-DIPHTH-ACELL PERTUSSIS 5-2.5-18.5 LF-MCG/0.5 IM SUSP: 0.5000 mL | Freq: Once | INTRAMUSCULAR | Status: DC

## 2013-11-16 NOTE — Progress Notes (Signed)
Patient ID: Maureen Simpson, female   DOB: 08-Sep-1976, 37 y.o.   MRN: [id number]  Delivery Note At 11:43 AM a viable female was delivered via Vaginal, Spontaneous Delivery (Presentation: Right Occiput Anterior with shoulder dystocia).  APGAR: 3 at 1 min, 6 at 5 min; weight TBD.   Placenta status: Spontaneous, central insertion with some calicifications.  Cord: 3VC, normal length with the following complications: .  Cord pH: 7.11  Anesthesia: Epidural  Episiotomy: None Lacerations: None Suture Repair: N/A Est. Blood Loss (mL): 500  Mom to postpartum.  Baby to NICU.  [name], CNM was present throughout and assisted with delivery.   [name] 11/16/2013, 12:11 PM  Addendum: NN team present immediately after delivery.  FHR reassuring through 2nd stage. Vtx OA with significant molding and caput, without spontaneous restitution. Dr. Jarvis Newcomer checked for nuchal cord which was not present and tried usual downward traction without effect. I then proceeded with exam noting shoulders in transverse. Two nurses did McRobert's. Attempted traction with maternal effort. Rotated anterior shoulder to oblique with right shoulder clockwise. Again attempted traction with and without suprapubic pressure. Attemped further clockwise rotation. Rt shoulder below symphysis and attempted downward and upward traction with gentle grip rt axilla. Felt for posterior arm- not reached. Delivered ant arm across chest and body delivered without difficulty. Shoulder dystocia 3 min duration per RN. Cord pHa 7.11. Baby to NICU.  [name], CNM 11/16/2013 1:38 PM

## 2013-11-16 NOTE — Progress Notes (Signed)
Pt to NICU to visit with infant prior to transfer to room 316 via wheelchair

## 2013-11-16 NOTE — Progress Notes (Signed)
Maureen Simpson is a 37 y.o. G1P0 at [redacted]w[redacted]d   Subjective: Comfortable with epidural  Objective: BP 110/61  Pulse 84  Temp(Src) 98.2 F (36.8 C) (Axillary)  Resp 18  Ht 5\' 3"  (1.6 m)  Wt 84.369 kg (186 lb)  BMI 32.96 kg/m2  SpO2 99%  LMP 02/21/2013 I/O last 3 completed shifts: In: -  Out: 550 [Urine:550]    FHT:  FHR: 150s bpm, variability: moderate,  accelerations:  Present,  decelerations:  Absent- occ mi variables UC:   irregular, every 2-6 minutes with Pitocin at 66mu/min SVE:   Dilation: 9 Effacement (%): 80 Station: 0 Exam by:: k. Dedric Ethington, cnm  Labs: Lab Results  Component Value Date   WBC 9.4 11/14/2013   HGB 11.6* 11/14/2013   HCT 32.8* 11/14/2013   MCV 88.2 11/14/2013   PLT 253 11/14/2013    Assessment / Plan: IUP at term ROM x 29 hours- afebrile Active labor/transition  Examine cx in 1-2 hours or sooner with urge to push  Maureen Simpson 11/16/2013, 5:00 AM

## 2013-11-16 NOTE — Progress Notes (Addendum)
Patient ID: Maureen Simpson, female   DOB: [birth year], 37 y.o.   MRN: [id number] Maureen Simpson is a 37 y.o. G1P0 at [redacted]w[redacted]d admitted for PROM since 1930 11/14/13  Subjective: Aware of UCs but no urge to push.  Objective: BP 123/70  Pulse 100  Temp(Src) 98.8 F (37.1 C) (Oral)  Resp 18  Ht 5\' 3"  (1.6 m)  Wt 84.369 kg (186 lb)  BMI 32.96 kg/m2  SpO2 99%  LMP 02/21/2013  Pushing x 20 min with head descent during push. Fetal Heart FHR: 135 bpm, variability: moderate,  accelerations:  Present,  decelerations:  Absent  Epidural reate about halved Contractions: q4-5 with pitocon > just increased  SVE:   Dilation: 10 Effacement (%): 80 Station: 0 Exam by:: Dr Penne Lash  Assessment / Plan:  Labor: active second stage Fetal Wellbeing: Category 1 Pain Control:  adequate Expected mode of delivery: NSVD  Maureen Simpson 11/16/2013, 9:29 AM

## 2013-11-17 NOTE — Progress Notes (Signed)
UR completed 

## 2013-11-17 NOTE — Progress Notes (Signed)
error 

## 2013-11-17 NOTE — Progress Notes (Signed)
Patient refused blood draw for am CBC. Informed patient the reason for having blood drawn, and the reason for  the CBC. Patient still declined. Resident informed. No new orders at this time.

## 2013-11-17 NOTE — Lactation Note (Signed)
This note was copied from the chart of Maureen Simpson. Lactation Consultation Note  Patient Name: Maureen Sherrin Stahle [id number] Date: 11/17/2013 Reason for consult: Other (Comment) (formula feeding for exclusion)   Maternal Data Formula Feeding for Exclusion: Yes Reason for exclusion: Mother's choice to formula feed on admision  Feeding    LATCH Score/Interventions                      Lactation Tools Discussed/Used     Consult Status      Alfred Levins 11/17/2013, 12:04 PM

## 2013-11-17 NOTE — Anesthesia Postprocedure Evaluation (Signed)
  Anesthesia Post-op Note  Patient: Maureen Simpson  Procedure(s) Performed: * No procedures listed *  Patient Location: [facility] Unit  Anesthesia Type:Epidural  Level of Consciousness: awake, alert , oriented and patient cooperative  Airway and Oxygen Therapy: Patient Spontanous Breathing  Post-op Pain: mild  Post-op Assessment: Patient's Cardiovascular Status Stable, Respiratory Function Stable, No headache, No backache, No residual numbness and No residual motor weakness  Post-op Vital Signs: stable  Complications: No apparent anesthesia complications

## 2013-11-18 MED ORDER — DOCUSATE SODIUM 100 MG PO CAPS: 100.0000 mg | ORAL_CAPSULE | Freq: Two times a day (BID) | ORAL | Status: DC | PRN

## 2013-11-18 MED ORDER — IBUPROFEN 600 MG PO TABS: 600.0000 mg | ORAL_TABLET | Freq: Four times a day (QID) | ORAL | Status: DC | PRN

## 2013-11-18 MED ORDER — OXYCODONE-ACETAMINOPHEN 5-325 MG PO TABS: 1.0000 | ORAL_TABLET | Freq: Four times a day (QID) | ORAL | Status: DC | PRN

## 2013-11-18 NOTE — Discharge Summary (Signed)
Obstetric Discharge Summary Reason for Admission: rupture of membranes at [redacted]w[redacted]d Prenatal Procedures: NST Intrapartum Procedures: spontaneous vaginal delivery Postpartum Procedures: none Complications-Intrapartum: Shoulder dystocia for three minutes; refer to delivery note below Hemoglobin  Date Value Range Status  11/14/2013 11.6* 12.0 - 15.0 g/dL Final     HCT  Date Value Range Status  11/14/2013 32.8* 36.0 - 46.0 % Final   Delivery Note  At 11:43 AM a viable female was delivered via Vaginal, Spontaneous Delivery (Presentation: Right Occiput Anterior with shoulder dystocia). APGAR: 3 at 1 min, 6 at 5 min; weight TBD.  Placenta status: Spontaneous, central insertion with some calicifications. Cord: 3VC, normal length with the following complications: . Cord pH: 7.11  Anesthesia: Epidural  Episiotomy: None  Lacerations: None  Suture Repair: N/A  Est. Blood Loss (mL): 500  Mom to postpartum. Baby to NICU.  Deirdre Christy Gentles, CNM was present throughout and assisted with delivery.   [name]  11/16/2013, 12:11 PM   CNM Addendum: NN team present immediately after delivery.  FHR reassuring through 2nd stage. Vtx OA with significant molding and caput, without spontaneous restitution. Dr. Jarvis Newcomer checked for nuchal cord which was not present and tried usual downward traction without effect. I then proceeded with exam noting shoulders in transverse. Two nurses did McRobert's. Attempted traction with maternal effort. Rotated anterior shoulder to oblique with right shoulder clockwise. Again attempted traction with and without suprapubic pressure. Attemped further clockwise rotation. Rt shoulder below symphysis and attempted downward and upward traction with gentle grip rt axilla. Felt for posterior arm- not reached. Delivered ant arm across chest and body delivered without difficulty. Shoulder dystocia 3 min duration per RN. Cord pHa 7.11. Baby to NICU.   [name], CNM  11/16/2013 1:38  PM  Brief Postpartum Course:  Uncomplicated postpartum course. Bottlefeeding, desires Mirena for contraception.  Patient was given letter to return to work in 3 weeks as per her preference.  Baby remained in NICU after maternal discharge. Discharged to home in stable condition on PPD#2.  Physical Exam:  Blood pressure 149/77, pulse 76, temperature 97.8 F (36.6 C), temperature source Oral, resp. rate 18, height 5\' 3"  (1.6 m), weight 186 lb (84.369 kg), last menstrual period 02/21/2013, SpO2 99.00%, unknown if currently breastfeeding. General: alert and no distress Lochia: appropriate Uterine Fundus: firm DVT Evaluation: No evidence of DVT seen on physical exam. Negative Homan's sign.  Discharge Diagnoses: Term Pregnancy-delivered  Discharge Information: Date: 11/18/2013 Activity: refer to discharge instructions Diet: routine Medications: Ibuprofen, Colace and Percocet Condition: stable Instructions: refer to discharge instructions Discharge to: home Follow-up Information   Follow up with [facility] In 5 weeks. (You will be called with postpartum appointment date and time)    Contact information:   [address] [place] [address] [phone]      Newborn Data: Live born female  Birth Weight: 7 lb 9 oz (3430 g) APGAR: 2, 6 Admitted to NICU  Tereso Newcomer, MD 11/18/2013, 10:31 AM

## 2013-11-18 NOTE — Plan of Care (Signed)
Problem: Phase II Progression Outcomes Goal: Pain controlled on oral analgesia Outcome: Completed/Met Date Met:  11/18/13 God pain control on po Motrin and Percocet Goal: Progress activity as tolerated unless otherwise ordered Outcome: Completed/Met Date Met:  11/18/13 Patient walks to NICU frequently and tolerates it well

## 2013-11-18 NOTE — CV Procedure (Signed)
Discharge instructions provided to patient at bedside.  Medications, activity, follow up appointments, when to call the doctor and community resources discussed.  No questions at this time.  Patient left unit in stable condition with all personal belongings and prescriptions.  Osvaldo Angst, RN--------------

## 2013-11-21 ENCOUNTER — Inpatient Hospital Stay (HOSPITAL_COMMUNITY): Payer: Medicaid Other

## 2013-11-21 ENCOUNTER — Observation Stay (HOSPITAL_COMMUNITY)
Admission: AD | Admit: 2013-11-21 | Discharge: 2013-11-23 | Disposition: A | Discharge status: Home / Self Care | Payer: Medicaid Other | Source: Ambulatory Visit | Attending: Family Medicine | Admitting: Family Medicine

## 2013-11-21 ENCOUNTER — Encounter (HOSPITAL_COMMUNITY): Payer: Self-pay | Admitting: *Deleted

## 2013-11-21 DIAGNOSIS — R030 Elevated blood-pressure reading, without diagnosis of hypertension: Secondary | ICD-10-CM

## 2013-11-21 DIAGNOSIS — R0989 Other specified symptoms and signs involving the circulatory and respiratory systems: Secondary | ICD-10-CM | POA: Insufficient documentation

## 2013-11-21 DIAGNOSIS — E8779 Other fluid overload: Secondary | ICD-10-CM | POA: Insufficient documentation

## 2013-11-21 DIAGNOSIS — R0609 Other forms of dyspnea: Secondary | ICD-10-CM

## 2013-11-21 DIAGNOSIS — IMO0002 Reserved for concepts with insufficient information to code with codable children: Principal | ICD-10-CM | POA: Insufficient documentation

## 2013-11-21 DIAGNOSIS — O9989 Other specified diseases and conditions complicating pregnancy, childbirth and the puerperium: Secondary | ICD-10-CM

## 2013-11-21 DIAGNOSIS — E876 Hypokalemia: Secondary | ICD-10-CM | POA: Insufficient documentation

## 2013-11-21 DIAGNOSIS — R0602 Shortness of breath: Secondary | ICD-10-CM

## 2013-11-21 DIAGNOSIS — O99893 Other specified diseases and conditions complicating puerperium: Secondary | ICD-10-CM | POA: Insufficient documentation

## 2013-11-21 DIAGNOSIS — Z87891 Personal history of nicotine dependence: Secondary | ICD-10-CM | POA: Insufficient documentation

## 2013-11-21 DIAGNOSIS — O26899 Other specified pregnancy related conditions, unspecified trimester: Secondary | ICD-10-CM

## 2013-11-21 HISTORY — DX: Reserved for concepts with insufficient information to code with codable children: IMO0002

## 2013-11-21 HISTORY — DX: Unspecified abnormal cytological findings in specimens from cervix uteri: R87.619

## 2013-11-21 LAB — COMPREHENSIVE METABOLIC PANEL
ALT: 55 U/L — ABNORMAL HIGH (ref 0–35)
Albumin: 2.4 g/dL — ABNORMAL LOW (ref 3.5–5.2)
Alkaline Phosphatase: 178 U/L — ABNORMAL HIGH (ref 39–117)
Creatinine, Ser: 0.72 mg/dL (ref 0.50–1.10)
GFR calc Af Amer: >90 mL/min (ref >90)
Potassium: 3.9 mEq/L (ref 3.5–5.1)
Sodium: 139 mEq/L (ref 135–145)
Total Protein: 5.6 g/dL — ABNORMAL LOW (ref 6.0–8.3)

## 2013-11-21 LAB — CBC
HCT: 28.9 % — ABNORMAL LOW (ref 36.0–46.0)
MCHC: 34.9 g/dL (ref 30.0–36.0)
RBC: 3.26 MIL/uL — ABNORMAL LOW (ref 3.87–5.11)
RDW: 13.2 % (ref 11.5–15.5)

## 2013-11-21 LAB — PROTEIN / CREATININE RATIO, URINE
Creatinine, Urine: 63.16 mg/dL
Protein Creatinine Ratio: 0.15 (ref 0.00–0.15)
Total Protein, Urine: 9.4 mg/dL

## 2013-11-21 LAB — MRSA PCR SCREENING: MRSA by PCR: NEGATIVE

## 2013-11-21 LAB — PRO B NATRIURETIC PEPTIDE: Pro B Natriuretic peptide (BNP): 1382 pg/mL — ABNORMAL HIGH (ref 0–125)

## 2013-11-21 MED ORDER — PRENATAL MULTIVITAMIN CH
1.0000 | ORAL_TABLET | Freq: Every day | ORAL | Status: DC
  Administered 2013-11-21 – 2013-11-22 (×2): 1 via ORAL
  Filled 2013-11-21 (×3): qty 1

## 2013-11-21 MED ORDER — DOCUSATE SODIUM 100 MG PO CAPS
100.0000 mg | ORAL_CAPSULE | Freq: Every day | ORAL | Status: DC
  Administered 2013-11-22: 100 mg via ORAL
  Filled 2013-11-21 (×2): qty 1

## 2013-11-21 MED ORDER — HYDRALAZINE HCL 20 MG/ML IJ SOLN
5.0000 mg | INTRAMUSCULAR | Status: DC | PRN
  Administered 2013-11-21 – 2013-11-22 (×3): 5 mg via INTRAVENOUS
  Filled 2013-11-21 (×3): qty 1

## 2013-11-21 MED ORDER — HYDROCHLOROTHIAZIDE 25 MG PO TABS
25.0000 mg | ORAL_TABLET | Freq: Every day | ORAL | Status: DC
  Filled 2013-11-21: qty 1

## 2013-11-21 MED ORDER — FUROSEMIDE 10 MG/ML IJ SOLN
20.0000 mg | Freq: Two times a day (BID) | INTRAMUSCULAR | Status: DC
  Administered 2013-11-21 – 2013-11-22 (×3): 20 mg via INTRAVENOUS
  Filled 2013-11-21 (×6): qty 2

## 2013-11-21 MED ORDER — ALBUTEROL SULFATE HFA 108 (90 BASE) MCG/ACT IN AERS
2.0000 | INHALATION_SPRAY | RESPIRATORY_TRACT | Status: DC | PRN
  Administered 2013-11-21: 2 via RESPIRATORY_TRACT
  Filled 2013-11-21: qty 6.7

## 2013-11-21 MED ORDER — INFLUENZA VAC SPLIT QUAD 0.5 ML IM SUSP
0.5000 mL | INTRAMUSCULAR | Status: AC
  Administered 2013-11-22: 0.5 mL via INTRAMUSCULAR
  Filled 2013-11-21: qty 0.5

## 2013-11-21 MED ORDER — ACETAMINOPHEN 325 MG PO TABS
650.0000 mg | ORAL_TABLET | ORAL | Status: DC | PRN
  Administered 2013-11-21 – 2013-11-22 (×4): 650 mg via ORAL
  Filled 2013-11-21 (×4): qty 2

## 2013-11-21 MED ORDER — ZOLPIDEM TARTRATE 5 MG PO TABS
5.0000 mg | ORAL_TABLET | Freq: Every evening | ORAL | Status: DC | PRN
  Administered 2013-11-21 – 2013-11-22 (×2): 5 mg via ORAL
  Filled 2013-11-21 (×2): qty 1

## 2013-11-21 MED ORDER — CALCIUM CARBONATE ANTACID 500 MG PO CHEW
2.0000 | CHEWABLE_TABLET | ORAL | Status: DC | PRN
  Filled 2013-11-21: qty 2

## 2013-11-21 NOTE — Progress Notes (Signed)
Tylenol given for HA and pt. Requested ambien for sleep

## 2013-11-21 NOTE — MAU Note (Signed)
Started noting swelling while in hosp, has gotten worse- pitting noted last night, hands are tingly.Marland Kitchen No BP problems during preg.    Less bleeding, is bottle feeding, milk is in, baby is doing well.

## 2013-11-21 NOTE — Progress Notes (Signed)
5mg  IV Hydralazine given

## 2013-11-21 NOTE — Progress Notes (Signed)
Echocardiogram 2D Echocardiogram has been performed.  Roland Lipke 11/21/2013, 2:03 PM

## 2013-11-21 NOTE — MAU Note (Signed)
Patient states she had a vaginal delivery on 11-20. States she has had swelling but has gotten worse since last night. Denies headache or visual changes. States she has shortness of breath with moving mostly.

## 2013-11-21 NOTE — Progress Notes (Signed)
Apresoline 5mg given 

## 2013-11-21 NOTE — Progress Notes (Signed)
UR chart review completed.  

## 2013-11-21 NOTE — H&P (Signed)
Maureen Simpson is a 37 y.o. G1P1001 at PPD#5 after NSVD at [redacted]w[redacted]d. She reports gradually increasing shortness of breath over the past week and increased lower extremity swelling that has worsened since delivery. On arrival she is found to be hypertensive >170/100. She had some swelling during pregnancy without hypertension or other symptoms. She has had difficulty sleeping in supine position related to SOB which resolves when she sits up. Also with SOB on exertion. Denies cough, hemoptysis, calf pain. Swelling remains constant throughout the day.   Today she denies headache, vision changes, dizziness, chest pain, nausea, vomiting, change in bowel or bladder habits, dysuria. She has had some lower abdominal pain for which she takes percocet and ibuprofen. She has taken 1 ibuprofen daily. She is still spotting but passing no clots or frankly bleeding. Not on bedrest, no recent travel.  PMH includes asthma, denies recent use of inhaler/need for use.   Past Medical History  Diagnosis Date  . Lipoma     right forearm  . No pertinent past medical history   . Asthma   . UTI (lower urinary tract infection)   . Abnormal Pap smear     during preg   Past Surgical History  Procedure Laterality Date  . Lipoma excision  05/26/2012    Procedure: EXCISION LIPOMA;  Surgeon: Atilano Ina, MD,FACS;  Location: [facility];  Service: General;  Laterality: Right;  excision of right proximal forearm lipoma   Family History  Problem Relation Age of Onset  . Hypertension Mother   . Hypertension Maternal Aunt   . Cancer Maternal Grandmother     colon    Social History:  reports that she quit smoking about 7 months ago. Her smoking use included Cigarettes. She smoked 0.25 packs per day. She has never used smokeless tobacco. She reports that she does not drink alcohol or use illicit drugs.  Allergies: No Known Allergies  Facility-administered medications prior to admission  Medication Dose Route  Frequency Provider Last Rate Last Dose  . TDaP (BOOSTRIX) injection 0.5 mL  0.5 mL Intramuscular Once Minta Balsam, MD       Prescriptions prior to admission  Medication Sig Dispense Refill  . ibuprofen (ADVIL,MOTRIN) 600 MG tablet Take 1 tablet (600 mg total) by mouth every 6 (six) hours as needed for headache, mild pain or cramping.  60 tablet  3  . oxyCODONE-acetaminophen (PERCOCET/ROXICET) 5-325 MG per tablet Take 1-2 tablets by mouth every 6 (six) hours as needed for severe pain (moderate - severe pain).  30 tablet  0  . Prenatal Vit-Fe Fumarate-FA (PRENATAL MULTIVITAMIN) TABS Take 1 tablet by mouth daily at 12 noon.  30 tablet  3    ROS  Blood pressure 139/91, pulse 66, temperature 98.9 F (37.2 C), temperature source Oral, resp. rate 18, last menstrual period 02/21/2013, SpO2 97.00%.  Filed Vitals:   11/21/13 1008 11/21/13 1119 11/21/13 1252 11/21/13 1300  BP:  139/91 163/92   Pulse: 57 66 52 56  Temp:   98.6 F (37 C)   TempSrc:   Oral   Resp:   16 21  Height:   5\' 3"  (1.6 m)   Weight:   87.59 kg (193 lb 1.6 oz)   SpO2: 97%  99% 99%    Physical Exam Constitutional: She is oriented to person, place, and time. She appears well-developed and well-nourished. No distress.  HENT:  Head: Normocephalic and atraumatic.  Eyes: Conjunctivae and EOM are normal. No scleral icterus.  Neck: Neck supple. No JVD present. Hepatojugular reflex absent. Cardiovascular: Normal rate and intact distal pulses. Exam reveals no gallop and no friction rub.  No murmur heard.  Respiratory: Effort normal and breath sounds normal. No respiratory distress (no oxygen requirement, normal rate, no crackles noted.).  GI: Soft. Bowel sounds are normal. She exhibits no distension (uterus firm/nontender below level of umbilicus. ).  Musculoskeletal: She exhibits edema (3+ pitting edema of bilateral lower extremities to the knee. No calf tenderness. L calf 42.5cm; R calf 43cm circumference).  Neurological:  She is alert and oriented to person, place, and time. She has normal reflexes.  Skin: Skin is warm and dry. No rash noted. No erythema.   Results for orders placed during the hospital encounter of 11/21/13 (from the past 24 hour(s))  PROTEIN / CREATININE RATIO, URINE     Status: None   Collection Time    11/21/13  9:20 AM      Result Value Range   Creatinine, Urine 63.16     Total Protein, Urine 9.4     PROTEIN CREATININE RATIO 0.15  0.00 - 0.15  CBC     Status: Abnormal   Collection Time    11/21/13 10:16 AM      Result Value Range   WBC 8.6  4.0 - 10.5 K/uL   RBC 3.26 (*) 3.87 - 5.11 MIL/uL   Hemoglobin 10.1 (*) 12.0 - 15.0 g/dL   HCT 16.1 (*) 09.6 - 04.5 %   MCV 88.7  78.0 - 100.0 fL   MCH 31.0  26.0 - 34.0 pg   MCHC 34.9  30.0 - 36.0 g/dL   RDW 40.9  81.1 - 91.4 %   Platelets 308  150 - 400 K/uL  COMPREHENSIVE METABOLIC PANEL     Status: Abnormal   Collection Time    11/21/13 10:16 AM      Result Value Range   Sodium 139  135 - 145 mEq/L   Potassium 3.9  3.5 - 5.1 mEq/L   Chloride 106  96 - 112 mEq/L   CO2 23  19 - 32 mEq/L   Glucose, Bld 74  70 - 99 mg/dL   BUN 12  6 - 23 mg/dL   Creatinine, Ser 7.82  0.50 - 1.10 mg/dL   Calcium 8.2 (*) 8.4 - 10.5 mg/dL   Total Protein 5.6 (*) 6.0 - 8.3 g/dL   Albumin 2.4 (*) 3.5 - 5.2 g/dL   AST 62 (*) 0 - 37 U/L   ALT 55 (*) 0 - 35 U/L   Alkaline Phosphatase 178 (*) 39 - 117 U/L   Total Bilirubin 0.3  0.3 - 1.2 mg/dL   GFR calc non Af Amer >90  >90 mL/min   GFR calc Af Amer >90  >90 mL/min    Dg Chest 2 View  11/21/2013   CLINICAL DATA:  Difficulty breathing  EXAM: CHEST  2 VIEW  COMPARISON:  12/02/2008  FINDINGS: Cardiac shadow is mildly enlarged. The lungs are well aerated but demonstrate mild bibasilar infiltrates. No sizable effusion is seen. No bony abnormality is noted.  IMPRESSION: Mild bibasilar infiltrative changes.   Electronically Signed   By: Alcide Clever M.D.   On: 11/21/2013 11:12   12-lead ECG: Sinus brady (HR  55) with normal axis and intervals. Low voltage without evidence of hypertrophy or conduction abnormality. No significant changes in ST segment or T wave.  Assessment/Plan: JLYN CERROS is a 37 y.o. G1P1001 at PPD#5 after NSVD  with signs and symptoms concerning for pre-eclampsia vs. peripartum cardiomyopathy.   #Pre-eclampsia vs. peripartum cardiomyopathy:  Shortness of breath with orthopnea, htn, edema and CXR concerning for cardiomyopathy more than PET. Majority of labs WNL, however given mild transamnitis (not 2x baseline), will repeat labs in AM for PET.  - Admit for observation in telemetry, will obtain echocardiogram to evaluate for HF - Check BNP - Start lasix 20mg  IV BID for diuresis, following I/O - repeat CMP, CBC in AM - No Mag at this time without evidence of Preeclampsia and concern for fluid status.  Hypertension: Possibly related to cardiomyopathy or Preeclampsia. New onset - BP has trended downward, most recently 139/91, without intervention  -- Hydralazine 5mg  PRN for >160/105  [name] 11/21/2013, 12:30 PM  I spoke with and examined patient and assisted in developing the resident's note and plan of care.  Tawana Scale, MD OB Fellow 11/21/2013 1:43 PM

## 2013-11-21 NOTE — Progress Notes (Signed)
2052 - apresoline 5mg  given for BP

## 2013-11-21 NOTE — Progress Notes (Signed)
Pt lying on BP cuff.

## 2013-11-22 DIAGNOSIS — R0602 Shortness of breath: Secondary | ICD-10-CM | POA: Diagnosis present

## 2013-11-22 LAB — COMPREHENSIVE METABOLIC PANEL
ALT: 67 U/L — ABNORMAL HIGH (ref 0–35)
AST: 65 U/L — ABNORMAL HIGH (ref 0–37)
Albumin: 2.5 g/dL — ABNORMAL LOW (ref 3.5–5.2)
Alkaline Phosphatase: 174 U/L — ABNORMAL HIGH (ref 39–117)
BUN: 10 mg/dL (ref 6–23)
Calcium: 8.4 mg/dL (ref 8.4–10.5)
Chloride: 104 mEq/L (ref 96–112)
GFR calc Af Amer: >90 mL/min (ref >90)
Glucose, Bld: 88 mg/dL (ref 70–99)
Potassium: 3.3 mEq/L — ABNORMAL LOW (ref 3.5–5.1)
Sodium: 139 mEq/L (ref 135–145)
Total Bilirubin: 0.3 mg/dL (ref 0.3–1.2)
Total Protein: 5.9 g/dL — ABNORMAL LOW (ref 6.0–8.3)

## 2013-11-22 LAB — CBC
HCT: 28.9 % — ABNORMAL LOW (ref 36.0–46.0)
Hemoglobin: 10 g/dL — ABNORMAL LOW (ref 12.0–15.0)
MCHC: 34.6 g/dL (ref 30.0–36.0)
Platelets: 316 10*3/uL (ref 150–400)
WBC: 8.1 10*3/uL (ref 4.0–10.5)

## 2013-11-22 MED ORDER — POTASSIUM CHLORIDE CRYS ER 20 MEQ PO TBCR
40.0000 meq | EXTENDED_RELEASE_TABLET | Freq: Once | ORAL | Status: AC
  Administered 2013-11-22: 40 meq via ORAL
  Filled 2013-11-22: qty 2

## 2013-11-22 MED ORDER — HYDRALAZINE HCL 20 MG/ML IJ SOLN
10.0000 mg | Freq: Once | INTRAMUSCULAR | Status: AC
  Administered 2013-11-22: 10 mg via INTRAVENOUS

## 2013-11-22 NOTE — Progress Notes (Signed)
FACULTY PRACTICE POSTPARTUM COMPREHENSIVE PROGRESS NOTE  Maureen Simpson is a 37 y.o. G1P1001 at [redacted]w[redacted]d  who is admitted for DOE and increased WOB s/p SVD 6 days ago.    Length of Stay:  1 Days. 11/21/2013  Subjective: Pt doing well. Breathing is much improved now. Able to lay more flat in bed. No fevers, chills. Still swollen in her feet but improved.  Required 2 treatments of hydralazine overnight for elevated pressures.   Vitals:  Blood pressure 158/88, pulse 53, temperature 98.4 F (36.9 C), temperature source Oral, resp. rate 14, height 5\' 3"  (1.6 m), weight 83.008 kg (183 lb), last menstrual period 02/21/2013, SpO2 99.00%. Physical Examination:  Gen: NAD, comfortable Lungs: diminished breath sounds in the bases with bibasilar crackles. No wheezes or rhonci.  CV: RRR, no murmurs ABD: soft, NT, uterus firm and below umbilicus EXT: 2+ distal pulses. 3+ edema to the knees bilaterally DTR: 2+. No clonus.    Results for orders placed during the hospital encounter of 11/21/13 (from the past 24 hour(s))  MRSA PCR SCREENING   Collection Time    11/21/13  1:15 PM      Result Value Range   MRSA by PCR NEGATIVE  NEGATIVE  CBC   Collection Time    11/22/13  5:32 AM      Result Value Range   WBC 8.1  4.0 - 10.5 K/uL   RBC 3.27 (*) 3.87 - 5.11 MIL/uL   Hemoglobin 10.0 (*) 12.0 - 15.0 g/dL   HCT 82.9 (*) 56.2 - 13.0 %   MCV 88.4  78.0 - 100.0 fL   MCH 30.6  26.0 - 34.0 pg   MCHC 34.6  30.0 - 36.0 g/dL   RDW 86.5  78.4 - 69.6 %   Platelets 316  150 - 400 K/uL  COMPREHENSIVE METABOLIC PANEL   Collection Time    11/22/13  5:32 AM      Result Value Range   Sodium 139  135 - 145 mEq/L   Potassium 3.3 (*) 3.5 - 5.1 mEq/L   Chloride 104  96 - 112 mEq/L   CO2 23  19 - 32 mEq/L   Glucose, Bld 88  70 - 99 mg/dL   BUN 10  6 - 23 mg/dL   Creatinine, Ser 2.95  0.50 - 1.10 mg/dL   Calcium 8.4  8.4 - 28.4 mg/dL   Total Protein 5.9 (*) 6.0 - 8.3 g/dL   Albumin 2.5 (*) 3.5 - 5.2 g/dL   AST 65 (*) 0 - 37 U/L   ALT 67 (*) 0 - 35 U/L   Alkaline Phosphatase 174 (*) 39 - 117 U/L   Total Bilirubin 0.3  0.3 - 1.2 mg/dL   GFR calc non Af Amer >90  >90 mL/min   GFR calc Af Amer >90  >90 mL/min    Dg Chest 2 View  11/21/2013   CLINICAL DATA:  Difficulty breathing  EXAM: CHEST  2 VIEW  COMPARISON:  12/02/2008  FINDINGS: Cardiac shadow is mildly enlarged. The lungs are well aerated but demonstrate mild bibasilar infiltrates. No sizable effusion is seen. No bony abnormality is noted.  IMPRESSION: Mild bibasilar infiltrative changes.   Electronically Signed   By: Alcide Clever M.D.   On: 11/21/2013 11:12    . docusate sodium  100 mg Oral Daily  . furosemide  20 mg Intravenous Q12H  . influenza vac split quadrivalent PF  0.5 mL Intramuscular Tomorrow-1000  . potassium chloride  40 mEq  Oral Once  . prenatal multivitamin  1 tablet Oral Q1200   I have reviewed the patient's current medications.  ASSESSMENT: Patient Active Problem List   Diagnosis Date Noted  . ASC-cannot exclude HGSIL on Pap 05/05/2013  . Advanced maternal age (AMA) in pregnancy 05/03/2013  . Supervision of normal pregnancy 05/03/2013  . Lipoma of arm 04/14/2012    PLAN:  1) volume overload - suspect pre-eclampsia.  Now diuresing well. Echo WNL with EF of 65%.   - cont IV lasix - monitor bp and treat PRN - no indication for mag as symptomatically improving.   2) hypokalemia - replace today  3) transfer to gyn floor - likely home tomorrow if continued good diuresis   Rulon Abide, MD  OB Fellow Faculty Practice, [facility] [place]

## 2013-11-22 NOTE — Progress Notes (Signed)
UR completed 

## 2013-11-22 NOTE — Progress Notes (Signed)
Apresoline 5mg  given

## 2013-11-22 NOTE — Progress Notes (Signed)
Pt ambulated to Rm #[id number] w/NT for transfer - RN will for report.

## 2013-11-23 DIAGNOSIS — IMO0002 Reserved for concepts with insufficient information to code with codable children: Secondary | ICD-10-CM

## 2013-11-23 DIAGNOSIS — R0602 Shortness of breath: Secondary | ICD-10-CM

## 2013-11-23 DIAGNOSIS — O135 Gestational [pregnancy-induced] hypertension without significant proteinuria, complicating the puerperium: Secondary | ICD-10-CM

## 2013-11-23 LAB — CBC
HCT: 33.6 % — ABNORMAL LOW (ref 36.0–46.0)
Hemoglobin: 11.6 g/dL — ABNORMAL LOW (ref 12.0–15.0)
MCH: 30.7 pg (ref 26.0–34.0)
MCHC: 34.5 g/dL (ref 30.0–36.0)
MCV: 88.9 fL (ref 78.0–100.0)
Platelets: 387 10*3/uL (ref 150–400)
RDW: 13.5 % (ref 11.5–15.5)

## 2013-11-23 LAB — COMPREHENSIVE METABOLIC PANEL
Albumin: 2.8 g/dL — ABNORMAL LOW (ref 3.5–5.2)
BUN: 11 mg/dL (ref 6–23)
Calcium: 8.5 mg/dL (ref 8.4–10.5)
Creatinine, Ser: 0.71 mg/dL (ref 0.50–1.10)
GFR calc non Af Amer: >90 mL/min (ref >90)
Potassium: 3.9 mEq/L (ref 3.5–5.1)
Total Protein: 6.9 g/dL (ref 6.0–8.3)

## 2013-11-23 MED ORDER — AMLODIPINE BESYLATE 10 MG PO TABS: 10.0000 mg | ORAL_TABLET | Freq: Every day | ORAL | Status: DC

## 2013-11-23 MED ORDER — HYDROCHLOROTHIAZIDE 25 MG PO TABS
25.0000 mg | ORAL_TABLET | Freq: Every day | ORAL | Status: DC
  Filled 2013-11-23: qty 1

## 2013-11-23 MED ORDER — HYDROCHLOROTHIAZIDE 25 MG PO TABS: 25.0000 mg | ORAL_TABLET | Freq: Every day | ORAL | Status: DC

## 2013-11-23 MED ORDER — AMLODIPINE BESYLATE 10 MG PO TABS
10.0000 mg | ORAL_TABLET | Freq: Every day | ORAL | Status: DC
  Filled 2013-11-23: qty 1

## 2013-11-23 NOTE — Progress Notes (Signed)
Pt ambulated out to car   Teaching complete  To pick up scripts

## 2013-11-23 NOTE — Discharge Summary (Signed)
Physician Discharge Summary  Patient ID: Maureen Simpson MRN: [id number] DOB/AGE: [birth year] 37 y.o.  Admit date: 11/21/2013 Discharge date: 11/23/2013  Admission Diagnoses:  Postpartum SOB, elevated blood pressure,  lower extremity edema on PPD#5  Discharge Diagnoses:  Resolved SOB, improved edema  Discharged Condition: Stable  Hospital Course: Patient admitted on PPD#5 for SOB and increased edema, there was concern about postpartum cardiomyopathy given BNP 1382.  However, she had a negative ECHO.   She also had elevated blood pressure, but no signs/symptoms of preeclampsia.  She was treated with antihypertensives and diuretics, and was noted to diurese significantly; had 15 pound weight loss during her admission.  She required a few IV doses of Hydralazine, she was started on Norvasc and HCTZ on the day of discharge for maintenance therapy.  She had no further concerns and was deemed stable for discharge to home.  Significant Diagnostic Studies:  Filed Weights   11/21/13 1630 11/22/13 0500 11/23/13 0607  Weight: 189 lb 11.2 oz (86.047 kg) 183 lb (83.008 kg) 174 lb 12 oz (79.266 kg)   Results for orders placed during the hospital encounter of 11/21/13 (from the past 72 hour(s))  PROTEIN / CREATININE RATIO, URINE     Status: None   Collection Time    11/21/13  9:20 AM      Result Value Range   Creatinine, Urine 63.16     Total Protein, Urine 9.4     Comment: NO NORMAL RANGE ESTABLISHED FOR THIS TEST   PROTEIN CREATININE RATIO 0.15  0.00 - 0.15   Comment: Performed at [facility]  PRO B NATRIURETIC PEPTIDE     Status: Abnormal   Collection Time    11/21/13 10:15 AM      Result Value Range   Pro B Natriuretic peptide (BNP) 1382.0 (*) 0 - 125 pg/mL   Comment: Performed at [facility]  CBC     Status: Abnormal   Collection Time    11/21/13 10:16 AM      Result Value Range   WBC 8.6  4.0 - 10.5 K/uL   RBC 3.26 (*) 3.87 - 5.11 MIL/uL   Hemoglobin 10.1 (*) 12.0 -  15.0 g/dL   HCT 40.9 (*) 81.1 - 91.4 %   MCV 88.7  78.0 - 100.0 fL   MCH 31.0  26.0 - 34.0 pg   MCHC 34.9  30.0 - 36.0 g/dL   RDW 78.2  95.6 - 21.3 %   Platelets 308  150 - 400 K/uL  COMPREHENSIVE METABOLIC PANEL     Status: Abnormal   Collection Time    11/21/13 10:16 AM      Result Value Range   Sodium 139  135 - 145 mEq/L   Potassium 3.9  3.5 - 5.1 mEq/L   Chloride 106  96 - 112 mEq/L   CO2 23  19 - 32 mEq/L   Glucose, Bld 74  70 - 99 mg/dL   BUN 12  6 - 23 mg/dL   Creatinine, Ser 0.86  0.50 - 1.10 mg/dL   Calcium 8.2 (*) 8.4 - 10.5 mg/dL   Total Protein 5.6 (*) 6.0 - 8.3 g/dL   Albumin 2.4 (*) 3.5 - 5.2 g/dL   AST 62 (*) 0 - 37 U/L   ALT 55 (*) 0 - 35 U/L   Alkaline Phosphatase 178 (*) 39 - 117 U/L   Total Bilirubin 0.3  0.3 - 1.2 mg/dL   GFR calc non Af Amer >90  >  90 mL/min   GFR calc Af Amer >90  >90 mL/min   Comment: (NOTE)     The eGFR has been calculated using the CKD EPI equation.     This calculation has not been validated in all clinical situations.     eGFR's persistently <90 mL/min signify possible Chronic Kidney     Disease.  MRSA PCR SCREENING     Status: None   Collection Time    11/21/13  1:15 PM      Result Value Range   MRSA by PCR NEGATIVE  NEGATIVE   Comment:            The GeneXpert MRSA Assay (FDA     approved for NASAL specimens     only), is one component of a     comprehensive MRSA colonization     surveillance program. It is not     intended to diagnose MRSA     infection nor to guide or     monitor treatment for     MRSA infections.  CBC     Status: Abnormal   Collection Time    11/22/13  5:32 AM      Result Value Range   WBC 8.1  4.0 - 10.5 K/uL   RBC 3.27 (*) 3.87 - 5.11 MIL/uL   Hemoglobin 10.0 (*) 12.0 - 15.0 g/dL   HCT 16.1 (*) 09.6 - 04.5 %   MCV 88.4  78.0 - 100.0 fL   MCH 30.6  26.0 - 34.0 pg   MCHC 34.6  30.0 - 36.0 g/dL   RDW 40.9  81.1 - 91.4 %   Platelets 316  150 - 400 K/uL  COMPREHENSIVE METABOLIC PANEL     Status:  Abnormal   Collection Time    11/22/13  5:32 AM      Result Value Range   Sodium 139  135 - 145 mEq/L   Potassium 3.3 (*) 3.5 - 5.1 mEq/L   Chloride 104  96 - 112 mEq/L   CO2 23  19 - 32 mEq/L   Glucose, Bld 88  70 - 99 mg/dL   BUN 10  6 - 23 mg/dL   Creatinine, Ser 7.82  0.50 - 1.10 mg/dL   Calcium 8.4  8.4 - 95.6 mg/dL   Total Protein 5.9 (*) 6.0 - 8.3 g/dL   Albumin 2.5 (*) 3.5 - 5.2 g/dL   AST 65 (*) 0 - 37 U/L   ALT 67 (*) 0 - 35 U/L   Alkaline Phosphatase 174 (*) 39 - 117 U/L   Total Bilirubin 0.3  0.3 - 1.2 mg/dL   GFR calc non Af Amer >90  >90 mL/min   GFR calc Af Amer >90  >90 mL/min   Comment: (NOTE)     The eGFR has been calculated using the CKD EPI equation.     This calculation has not been validated in all clinical situations.     eGFR's persistently <90 mL/min signify possible Chronic Kidney     Disease.  CBC     Status: Abnormal   Collection Time    11/23/13  6:50 AM      Result Value Range   WBC 8.7  4.0 - 10.5 K/uL   RBC 3.78 (*) 3.87 - 5.11 MIL/uL   Hemoglobin 11.6 (*) 12.0 - 15.0 g/dL   HCT 21.3 (*) 08.6 - 57.8 %   MCV 88.9  78.0 - 100.0 fL   MCH 30.7  26.0 - 34.0  pg   MCHC 34.5  30.0 - 36.0 g/dL   RDW 78.2  95.6 - 21.3 %   Platelets 387  150 - 400 K/uL  COMPREHENSIVE METABOLIC PANEL     Status: Abnormal   Collection Time    11/23/13  6:50 AM      Result Value Range   Sodium 137  135 - 145 mEq/L   Potassium 3.9  3.5 - 5.1 mEq/L   Chloride 105  96 - 112 mEq/L   CO2 23  19 - 32 mEq/L   Glucose, Bld 86  70 - 99 mg/dL   BUN 11  6 - 23 mg/dL   Creatinine, Ser 0.86  0.50 - 1.10 mg/dL   Calcium 8.5  8.4 - 57.8 mg/dL   Total Protein 6.9  6.0 - 8.3 g/dL   Albumin 2.8 (*) 3.5 - 5.2 g/dL   AST 43 (*) 0 - 37 U/L   ALT 59 (*) 0 - 35 U/L   Alkaline Phosphatase 181 (*) 39 - 117 U/L   Total Bilirubin 0.4  0.3 - 1.2 mg/dL   GFR calc non Af Amer >90  >90 mL/min   GFR calc Af Amer >90  >90 mL/min   Comment: (NOTE)     The eGFR has been calculated using the  CKD EPI equation.     This calculation has not been validated in all clinical situations.     eGFR's persistently <90 mL/min signify possible Chronic Kidney     Disease.   11/21/2013   CHEST  2 VIEW CLINICAL DATA:  Difficulty breathing  COMPARISON:  12/02/2008  FINDINGS: Cardiac shadow is mildly enlarged. The lungs are well aerated but demonstrate mild bibasilar infiltrates. No sizable effusion is seen. No bony abnormality is noted.  IMPRESSION: Mild bibasilar infiltrative changes.   Electronically Signed   By: Alcide Clever M.D.   On: 11/21/2013 11:12   11/21/13 2D Echocardiogram Study Conclusions - Left ventricle: The cavity size was normal. Wall thickness was normal. Systolic function was normal. The estimated ejection fraction was in the range of 60% to 65%. Wall motion was normal; there were no regional wall motionabnormalities. Left ventricular diastolic function parameters were normal. - Left atrium: The atrium was normal in size. - Tricuspid valve: No significant regurgitation. - Systemic veins: The IVC was not visualized. - Pericardium, extracardiac: There was no pericardial effusion.  Discharge Exam: Blood pressure 150/91, pulse 67, temperature 98.8 F (37.1 C), temperature source Oral, resp. rate 18, height 5\' 3"  (1.6 m), weight 174 lb 12 oz (79.266 kg), last menstrual period 02/21/2013, SpO2 98.00%. General appearance: alert and no distress Resp: clear to auscultation bilaterally Cardio: regular rate and rhythm GI: soft, non-tender; bowel sounds normal; no masses,  no organomegaly Pelvic: deferred Extremities: extremities normal, atraumatic, no cyanosis or edema, Homans sign is negative, no sign of DVT and trace edema BLE  Disposition: 01-Home or Self Care       Future Appointments Provider Department Dept Phone   12/18/2013 2:45 PM Reva Bores, MD [facility] [phone]       Medication List         amLODipine 10 MG tablet  Commonly known as:  NORVASC   Take 1 tablet (10 mg total) by mouth daily.     hydrochlorothiazide 25 MG tablet  Commonly known as:  HYDRODIURIL  Take 1 tablet (25 mg total) by mouth daily.     ibuprofen 600 MG tablet  Commonly known as:  ADVIL,MOTRIN  Take 1 tablet (600 mg total) by mouth every 6 (six) hours as needed for headache, mild pain or cramping.     oxyCODONE-acetaminophen 5-325 MG per tablet  Commonly known as:  PERCOCET/ROXICET  Take 1-2 tablets by mouth every 6 (six) hours as needed for severe pain (moderate - severe pain).     prenatal multivitamin Tabs tablet  Take 1 tablet by mouth daily at 12 noon.       Follow-up Information   Follow up with [facility] In 1 week. (You will called with appointment for BP check. Come to [facility] for any worsening symptoms.)    Contact information:   [address] [place] [address] [phone]      Signed: Tereso Newcomer, MD 11/23/2013, 8:07 AM

## 2013-11-28 NOTE — H&P (Signed)
Chart reviewed and agree with management and plan.  

## 2013-12-05 ENCOUNTER — Telehealth: Payer: Self-pay | Admitting: Obstetrics & Gynecology

## 2013-12-05 NOTE — Telephone Encounter (Signed)
Called patient to give her an appointment to come in and get her blood pressure checked. Patient stated she did not need this appointment because her sister is a Engineer, civil (consulting), and says her BP is fine. I informed her the Dr. wanted  her to come in so it could be documented. Patient stated she could not come, and didn't need to come.

## 2013-12-18 ENCOUNTER — Other Ambulatory Visit (HOSPITAL_COMMUNITY)
Admission: RE | Admit: 2013-12-18 | Discharge: 2013-12-18 | Disposition: A | Discharge status: Home / Self Care | Payer: Medicaid Other | Source: Ambulatory Visit | Attending: Family Medicine | Admitting: Family Medicine

## 2013-12-18 ENCOUNTER — Ambulatory Visit (INDEPENDENT_AMBULATORY_CARE_PROVIDER_SITE_OTHER): Payer: Medicaid Other | Admitting: Family Medicine

## 2013-12-18 ENCOUNTER — Encounter: Payer: Self-pay | Admitting: Family Medicine

## 2013-12-18 VITALS — BP 124/85 | HR 84 | Temp 97.7°F | Ht 63.0 in | Wt 158.8 lb

## 2013-12-18 DIAGNOSIS — Z30430 Encounter for insertion of intrauterine contraceptive device: Secondary | ICD-10-CM

## 2013-12-18 DIAGNOSIS — Z124 Encounter for screening for malignant neoplasm of cervix: Secondary | ICD-10-CM | POA: Insufficient documentation

## 2013-12-18 DIAGNOSIS — Z1151 Encounter for screening for human papillomavirus (HPV): Secondary | ICD-10-CM | POA: Insufficient documentation

## 2013-12-18 DIAGNOSIS — R87619 Unspecified abnormal cytological findings in specimens from cervix uteri: Secondary | ICD-10-CM | POA: Insufficient documentation

## 2013-12-18 DIAGNOSIS — O1003 Pre-existing essential hypertension complicating the puerperium: Secondary | ICD-10-CM

## 2013-12-18 DIAGNOSIS — O165 Unspecified maternal hypertension, complicating the puerperium: Secondary | ICD-10-CM

## 2013-12-18 DIAGNOSIS — R6889 Other general symptoms and signs: Secondary | ICD-10-CM

## 2013-12-18 DIAGNOSIS — Z01812 Encounter for preprocedural laboratory examination: Secondary | ICD-10-CM

## 2013-12-18 DIAGNOSIS — L02229 Furuncle of trunk, unspecified: Secondary | ICD-10-CM

## 2013-12-18 DIAGNOSIS — IMO0001 Reserved for inherently not codable concepts without codable children: Secondary | ICD-10-CM

## 2013-12-18 DIAGNOSIS — R87810 Cervical high risk human papillomavirus (HPV) DNA test positive: Secondary | ICD-10-CM | POA: Insufficient documentation

## 2013-12-18 DIAGNOSIS — Z3049 Encounter for surveillance of other contraceptives: Secondary | ICD-10-CM

## 2013-12-18 LAB — POCT PREGNANCY, URINE: Preg Test, Ur: NEGATIVE

## 2013-12-18 MED ORDER — LEVONORGESTREL 20 MCG/24HR IU IUD
INTRAUTERINE_SYSTEM | Freq: Once | INTRAUTERINE | Status: AC
  Administered 2013-12-18: 16:00:00 1 via INTRAUTERINE

## 2013-12-18 MED ORDER — DOXYCYCLINE HYCLATE 100 MG PO CAPS: 100.0000 mg | ORAL_CAPSULE | Freq: Two times a day (BID) | ORAL | Status: DC

## 2013-12-18 MED ORDER — HYDROCHLOROTHIAZIDE 25 MG PO TABS: 25.0000 mg | ORAL_TABLET | Freq: Every day | ORAL | Status: DC

## 2013-12-18 MED ORDER — AMLODIPINE BESYLATE 10 MG PO TABS: 10.0000 mg | ORAL_TABLET | Freq: Every day | ORAL | Status: DC

## 2013-12-18 NOTE — Assessment & Plan Note (Signed)
May try to slowly wean off medication.  Keep track of BP's if consistently up--may have to add back meds.

## 2013-12-18 NOTE — Progress Notes (Signed)
  Subjective:     Maureen Simpson is a 37 y.o. female who presents for a postpartum visit. She is 6 weeks postpartum following a spontaneous vaginal delivery. I have fully reviewed the prenatal and intrapartum course. The delivery was at 41 gestational weeks. Outcome: spontaneous vaginal delivery. Anesthesia: epidural. Postpartum course has been unremarkable. Baby's course has been normal. Baby is feeding by bottle - Gerber Gentle. Bleeding no bleeding. Bowel function is normal. Bladder function is normal. Patient is not sexually active. Contraception method is none. Postpartum depression screening: negative. ASC-H previously and no colpo--needs repeat at pp. Has a boil on shoulder that is red and tender.  H/o black head at this site for a long time.  The following portions of the patient's history were reviewed and updated as appropriate: allergies, current medications, past family history, past medical history, past social history, past surgical history and problem list.  Review of Systems Pertinent items are noted in HPI.   Objective:    BP 124/85  Pulse 84  Temp(Src) 97.7 F (36.5 C) (Oral)  Ht 5\' 3"  (1.6 m)  Wt 158 lb 12.8 oz (72.031 kg)  BMI 28.14 kg/m2  Breastfeeding? No  General:  alert, cooperative and appears stated age  Abdomen: soft, non-tender; bowel sounds normal; no masses,  no organomegaly   Vulva:  normal  Vagina: normal vagina  Cervix:  multiparous appearance  Corpus: normal size, contour, position, consistency, mobility, non-tender  Adnexa:  normal adnexa       Procedure: Patient identified, informed consent performed, signed copy in chart, time out was performed.  Urine pregnancy test negative.  Speculum placed in the vagina.  Cervix visualized.  Cleaned with Betadine x 2.  Grasped anteriourly with a single tooth tenaculum.  Uterus sounded to 8 cm.  Mirena IUD placed per manufacturer's recommendations.  Strings trimmed to 3 cm.   Patient given post procedure  instructions and Mirena care card with expiration date.  Patient is asked to check IUD strings periodically and follow up in 4-6 weeks for IUD check.  Assessment:     Normal postpartum exam. Pap smear done at today's visit.   Plan:    1. Contraception: IUD inserted today 2. Repeat pap today 3. Follow up in: 6 months or as needed.

## 2013-12-18 NOTE — Assessment & Plan Note (Signed)
Repeat pap done today--will await results

## 2013-12-18 NOTE — Patient Instructions (Addendum)
Abnormal Pap Test Information During a Pap test, the cells on the surface of your cervix are checked to see if they look normal, abnormal, or if they show signs of having been altered by a certain type of virus called human papillomavirus, or HPV. Cervical cells that have been affected by HPV are called dysplasia. Dysplasia is not cancer, but describes abnormal cells found on the surface of the cervix. Depending on the degree of dysplasia, some of the cells may be considered pre-cancerous and may turn into cancer over time if follow up with a caregiver is delayed.  WHAT DOES AN ABNORMAL PAP TEST MEAN? Having an abnormal pap test does not mean that you have cancer. However, certain types of abnormal pap tests can be a sign that a person is at a higher risk of developing cancer. Your caregiver will want to do other tests to find out more about the abnormal cells. Your abnormal Pap test results could show:   Small and uncertain changes that should be carefully watched.   Cervical dysplasia that has caused mild changes and can be followed over time.  Cervical dysplasia that is more severe and needs to be followed and treated to ensure the problem goes away.  Cancer.  When severe cervical dysplasia is found and treated early, it rarely will grow into cancer.  WHAT WILL BE DONE ABOUT MY ABNORMAL PAP TEST?  A colposcopy may be needed. This is a procedure where your cervix is examined using light and magnification.  A small tissue sample of your cervix (biopsy) may need to be removed and then examined. This is often performed if there are areas that appear infected.  A sample of cells from the cervical canal may be removed with either a small brush or scraping instrument (curette). Based on the results of the procedures above, some caregivers may recommend either cryotherapy of the cervix or a surgical LEEP where a portion of the cervix is removed. LEEP is short for "loop electrical excisional  procedure." Rarely, a caregiver may recommend a cone biopsy.This is a procedure where a small, cone-shaped sample of your cervix is taken out. The part that is taken out is the area where the abnormal cells are.  WHAT IF I HAVE A DYSPLASIA OR A CANCER? You may be referred to a specialist. Radiation may also be a treatment for more advanced cancer. Having a hysterectomy is the last treatment option for dysplasia, but it is a more common treatment for someone with cancer. All treatment options will be discussed with you by your caregiver. WHAT SHOULD YOU DO AFTER BEING TREATED? If you have had an abnormal pap test, you should continue to have regular pap tests and check-ups as directed by your caregiver. Your cervical problem will be carefully watched so it does not get worse. Also, your caregiver can watch for, and treat, any new problems that may come up. Document Released: 03/31/2011 Document Revised: 04/10/2013 Document Reviewed: 12/10/2011 [facility] Patient Information 2014 [place], [place]. Levonorgestrel intrauterine device (IUD) What is this medicine? LEVONORGESTREL IUD (LEE voe nor jes trel) is a contraceptive (birth control) device. The device is placed inside the uterus by a healthcare professional. It is used to prevent pregnancy and can also be used to treat heavy bleeding that occurs during your period. Depending on the device, it can be used for 3 to 5 years. This medicine may be used for other purposes; ask your health care provider or pharmacist if you have questions. COMMON BRAND NAME(S):  Gretta Cool What should I tell my health care provider before I take this medicine? They need to know if you have any of these conditions: -abnormal Pap smear -cancer of the breast, uterus, or cervix -diabetes -endometritis -genital or pelvic infection now or in the past -have more than one sexual partner or your partner has more than one partner -heart disease -history of an ectopic or tubal  pregnancy -immune system problems -IUD in place -liver disease or tumor -problems with blood clots or take blood-thinners -use intravenous drugs -uterus of unusual shape -vaginal bleeding that has not been explained -an unusual or allergic reaction to levonorgestrel, other hormones, silicone, or polyethylene, medicines, foods, dyes, or preservatives -pregnant or trying to get pregnant -breast-feeding How should I use this medicine? This device is placed inside the uterus by a health care professional. Talk to your pediatrician regarding the use of this medicine in children. Special care may be needed. Overdosage: If you think you have taken too much of this medicine contact a poison control center or emergency room at once. NOTE: This medicine is only for you. Do not share this medicine with others. What if I miss a dose? This does not apply. What may interact with this medicine? Do not take this medicine with any of the following medications: -amprenavir -bosentan -fosamprenavir This medicine may also interact with the following medications: -aprepitant -barbiturate medicines for inducing sleep or treating seizures -bexarotene -griseofulvin -medicines to treat seizures like carbamazepine, ethotoin, felbamate, oxcarbazepine, phenytoin, topiramate -modafinil -pioglitazone -rifabutin -rifampin -rifapentine -some medicines to treat HIV infection like atazanavir, indinavir, lopinavir, nelfinavir, tipranavir, ritonavir -St. John's wort -warfarin This list may not describe all possible interactions. Give your health care provider a list of all the medicines, herbs, non-prescription drugs, or dietary supplements you use. Also tell them if you smoke, drink alcohol, or use illegal drugs. Some items may interact with your medicine. What should I watch for while using this medicine? Visit your doctor or health care professional for regular check ups. See your doctor if you or your partner  has sexual contact with others, becomes HIV positive, or gets a sexual transmitted disease. This product does not protect you against HIV infection (AIDS) or other sexually transmitted diseases. You can check the placement of the IUD yourself by reaching up to the top of your vagina with clean fingers to feel the threads. Do not pull on the threads. It is a good habit to check placement after each menstrual period. Call your doctor right away if you feel more of the IUD than just the threads or if you cannot feel the threads at all. The IUD may come out by itself. You may become pregnant if the device comes out. If you notice that the IUD has come out use a backup birth control method like condoms and call your health care provider. Using tampons will not change the position of the IUD and are okay to use during your period. What side effects may I notice from receiving this medicine? Side effects that you should report to your doctor or health care professional as soon as possible: -allergic reactions like skin rash, itching or hives, swelling of the face, lips, or tongue -fever, flu-like symptoms -genital sores -high blood pressure -no menstrual period for 6 weeks during use -pain, swelling, warmth in the leg -pelvic pain or tenderness -severe or sudden headache -signs of pregnancy -stomach cramping -sudden shortness of breath -trouble with balance, talking, or walking -unusual vaginal bleeding, discharge -  yellowing of the eyes or skin Side effects that usually do not require medical attention (report to your doctor or health care professional if they continue or are bothersome): -acne -breast pain -change in sex drive or performance -changes in weight -cramping, dizziness, or faintness while the device is being inserted -headache -irregular menstrual bleeding within first 3 to 6 months of use -nausea This list may not describe all possible side effects. Call your doctor for medical  advice about side effects. You may report side effects to FDA at 1-800-FDA-1088. Where should I keep my medicine? This does not apply. NOTE: This sheet is a summary. It may not cover all possible information. If you have questions about this medicine, talk to your doctor, pharmacist, or health care provider.  2014, Elsevier/Gold Standard. (2012-01-14 13:54:04) Abscess An abscess is an infected area that contains a collection of pus and debris.It can occur in almost any part of the body. An abscess is also known as a furuncle or boil. CAUSES  An abscess occurs when tissue gets infected. This can occur from blockage of oil or sweat glands, infection of hair follicles, or a minor injury to the skin. As the body tries to fight the infection, pus collects in the area and creates pressure under the skin. This pressure causes pain. People with weakened immune systems have difficulty fighting infections and get certain abscesses more often.  SYMPTOMS Usually an abscess develops on the skin and becomes a painful mass that is red, warm, and tender. If the abscess forms under the skin, you may feel a moveable soft area under the skin. Some abscesses break open (rupture) on their own, but most will continue to get worse without care. The infection can spread deeper into the body and eventually into the bloodstream, causing you to feel ill.  DIAGNOSIS  Your caregiver will take your medical history and perform a physical exam. A sample of fluid may also be taken from the abscess to determine what is causing your infection. TREATMENT  Your caregiver may prescribe antibiotic medicines to fight the infection. However, taking antibiotics alone usually does not cure an abscess. Your caregiver may need to make a small cut (incision) in the abscess to drain the pus. In some cases, gauze is packed into the abscess to reduce pain and to continue draining the area. HOME CARE INSTRUCTIONS   Only take over-the-counter or  prescription medicines for pain, discomfort, or fever as directed by your caregiver.  If you were prescribed antibiotics, take them as directed. Finish them even if you start to feel better.  If gauze is used, follow your caregiver's directions for changing the gauze.  To avoid spreading the infection:  Keep your draining abscess covered with a bandage.  Wash your hands well.  Do not share personal care items, towels, or whirlpools with others.  Avoid skin contact with others.  Keep your skin and clothes clean around the abscess.  Keep all follow-up appointments as directed by your caregiver. SEEK MEDICAL CARE IF:   You have increased pain, swelling, redness, fluid drainage, or bleeding.  You have muscle aches, chills, or a general ill feeling.  You have a fever. MAKE SURE YOU:   Understand these instructions.  Will watch your condition.  Will get help right away if you are not doing well or get worse. Document Released: 09/23/2005 Document Revised: 06/14/2012 Document Reviewed: 02/26/2012 [facility] Patient Information 2014 [place], [place].

## 2013-12-18 NOTE — Assessment & Plan Note (Signed)
Treat with Doxycycline, warm compresses. Keep covered.

## 2013-12-18 NOTE — Progress Notes (Signed)
Patient ID: Maureen Simpson, female   DOB: 06/02/1976, 37 y.o.   MRN: [id number] Pt. Desires Mirena. Would also like bump on back of left shoulder checked-- states it has grown since delivery and is causing pain (large bump with white head).

## 2013-12-26 ENCOUNTER — Telehealth: Payer: Self-pay

## 2013-12-26 NOTE — Telephone Encounter (Signed)
Message copied by Louanna Raw on Tue Dec 26, 2013  1:37 PM ------      Message from: Reva Bores      Created: Fri Dec 22, 2013 10:42 AM       Please schedule for colposcopy. ------

## 2013-12-26 NOTE — Telephone Encounter (Signed)
Please schedule pt. For colposcopy and then send it back to clinical basket and we can call pt. And notify of appointment and reason.

## 2014-01-01 ENCOUNTER — Telehealth: Payer: Self-pay

## 2014-01-01 NOTE — Telephone Encounter (Signed)
Pt called stating she has questions and concerns and would like a nurse to call her back. Called pt. And left message stating we are returning her call please call clinic.

## 2014-01-02 NOTE — Telephone Encounter (Signed)
Called Maureen Simpson and discussed her concerns. She wanted to know if it is normal for her to still be feeling pelvic pressure. She is having the pressure while at home and being physically active. She delivered her baby on 11/18/13 (had shoulder dystocia). I advised Maureen Simpson to alternate homecare tasks with short periods of rest. She asked for Rx for pain medication. I advised that she may take ibuprofen 600 mg every 6 hrs as needed if desired. She also stated that intercourse was uncomfortable and asked if this is normal. I advised her on some comfort measures and stated that if she is still having pelvic pressure and/or painful intercourse in another month, she may call for appt. Maureen Simpson voiced understanding.

## 2014-01-03 ENCOUNTER — Encounter: Payer: Self-pay | Admitting: *Deleted

## 2014-01-12 ENCOUNTER — Encounter: Payer: Medicaid Other | Admitting: Family Medicine

## 2014-01-22 NOTE — Telephone Encounter (Signed)
In checking up on when pt. Was to be scheduled for colpo it appears she had an appointment 01/02/14 that [facility] states pt. called and canceled. However, There is no documentation that patient had been notified of the need for a colpo or of the appointment. Pt. Had called and spoken with a nurse on 1/6 about pelvic pain but pt. Did not discuss her scheduled appointment or colpo. Pt. Needs to be notified of pap results from 12/20/13 and the need for a colpo. Colpo needs to be rescheduled.   Called pt. And left message stating I am calling with questions about a recent appointment that had been canceled please call clinic at your convenience.

## 2014-01-23 NOTE — Telephone Encounter (Signed)
Called pt. And asked her if she had been notified of her appointment on the 6th and the reason for it. Pt. States she did know she had an appointment and she did call to cancel it. Informed pt. That at her last appointment her pap smear showed some abnormal tissue and therefore we would like to do a colposcopy to take a closer look. Informed pt. That it is non-urgent but that we would like to do it soon. Pt. Verbalized understanding and asked for an appointment in the middle of February. Antoinette scheduled pt. For 02/16/14 at 0800. Called pt. And informed her of date and time. Pt. Verbalized understanding and stated she will be there.

## 2014-01-25 ENCOUNTER — Telehealth: Payer: Self-pay | Admitting: *Deleted

## 2014-01-25 DIAGNOSIS — R5383 Other fatigue: Secondary | ICD-10-CM

## 2014-01-25 MED ORDER — PRENATAL 27-0.8 MG PO TABS: 1.0000 | ORAL_TABLET | Freq: Every day | ORAL | Status: DC

## 2014-01-25 NOTE — Telephone Encounter (Signed)
Pt called nurse line requesting call back.  Spoke with pt, pt is having hair loss and request prescription for prenatal vitamin.

## 2014-02-16 ENCOUNTER — Encounter: Payer: Medicaid Other | Admitting: Family Medicine

## 2014-02-21 ENCOUNTER — Telehealth: Payer: Self-pay

## 2014-02-21 NOTE — Telephone Encounter (Signed)
Pt called requesting a call back. Called pt and pt said that she had a question concerning the IUD.  She asked if she is to be bleeding irregular. I explained to the patient that it was normal that she would experience irregular bleeding with the IUD and that it can take up to 6 months before it regulates.  Pt informed me that she had an odor after sex.  I asked pt if she had the odor after she showers she said no.  I explained to the patient if she is having intercourse while on her period she realizes that it could create an odor. Pt stated "ok, I just wanted to know cause the blood is freaking my out."  I advised to patient that when she comes in on the 03/19/14 for her colposcopy the provider will be able to check her strings. Pt stated understanding with no further questions.

## 2014-03-19 ENCOUNTER — Other Ambulatory Visit (HOSPITAL_COMMUNITY)
Admission: RE | Admit: 2014-03-19 | Discharge: 2014-03-19 | Disposition: A | Discharge status: Home / Self Care | Payer: Medicaid Other | Source: Ambulatory Visit | Attending: Family Medicine | Admitting: Family Medicine

## 2014-03-19 ENCOUNTER — Ambulatory Visit (INDEPENDENT_AMBULATORY_CARE_PROVIDER_SITE_OTHER): Payer: Medicaid Other | Admitting: Family Medicine

## 2014-03-19 ENCOUNTER — Encounter: Payer: Self-pay | Admitting: Family Medicine

## 2014-03-19 VITALS — BP 150/80 | HR 86 | Ht 63.0 in | Wt 158.6 lb

## 2014-03-19 DIAGNOSIS — Z01812 Encounter for preprocedural laboratory examination: Secondary | ICD-10-CM

## 2014-03-19 DIAGNOSIS — R87619 Unspecified abnormal cytological findings in specimens from cervix uteri: Secondary | ICD-10-CM

## 2014-03-19 DIAGNOSIS — IMO0002 Reserved for concepts with insufficient information to code with codable children: Secondary | ICD-10-CM

## 2014-03-19 DIAGNOSIS — N870 Mild cervical dysplasia: Secondary | ICD-10-CM | POA: Insufficient documentation

## 2014-03-19 DIAGNOSIS — R6889 Other general symptoms and signs: Secondary | ICD-10-CM

## 2014-03-19 LAB — POCT PREGNANCY, URINE: Preg Test, Ur: NEGATIVE

## 2014-03-19 NOTE — Progress Notes (Signed)
Patient is not taking BP meds anymore.

## 2014-03-19 NOTE — Patient Instructions (Signed)
Abnormal Pap Test Information During a Pap test, the cells on the surface of your cervix are checked to see if they look normal, abnormal, or if they show signs of having been altered by a certain type of virus called human papillomavirus, or HPV. Cervical cells that have been affected by HPV are called dysplasia. Dysplasia is not cancer, but describes abnormal cells found on the surface of the cervix. Depending on the degree of dysplasia, some of the cells may be considered pre-cancerous and may turn into cancer over time if follow up with a caregiver is delayed.  WHAT DOES AN ABNORMAL PAP TEST MEAN? Having an abnormal pap test does not mean that you have cancer. However, certain types of abnormal pap tests can be a sign that a person is at a higher risk of developing cancer. Your caregiver will want to do other tests to find out more about the abnormal cells. Your abnormal Pap test results could show:   Small and uncertain changes that should be carefully watched.   Cervical dysplasia that has caused mild changes and can be followed over time.  Cervical dysplasia that is more severe and needs to be followed and treated to ensure the problem goes away.  Cancer.  When severe cervical dysplasia is found and treated early, it rarely will grow into cancer.  WHAT WILL BE DONE ABOUT MY ABNORMAL PAP TEST?  A colposcopy may be needed. This is a procedure where your cervix is examined using light and magnification.  A small tissue sample of your cervix (biopsy) may need to be removed and then examined. This is often performed if there are areas that appear infected.  A sample of cells from the cervical canal may be removed with either a small brush or scraping instrument (curette). Based on the results of the procedures above, some caregivers may recommend either cryotherapy of the cervix or a surgical LEEP where a portion of the cervix is removed. LEEP is short for "loop electrical excisional  procedure." Rarely, a caregiver may recommend a cone biopsy.This is a procedure where a small, cone-shaped sample of your cervix is taken out. The part that is taken out is the area where the abnormal cells are.  WHAT IF I HAVE A DYSPLASIA OR A CANCER? You may be referred to a specialist. Radiation may also be a treatment for more advanced cancer. Having a hysterectomy is the last treatment option for dysplasia, but it is a more common treatment for someone with cancer. All treatment options will be discussed with you by your caregiver. WHAT SHOULD YOU DO AFTER BEING TREATED? If you have had an abnormal pap test, you should continue to have regular pap tests and check-ups as directed by your caregiver. Your cervical problem will be carefully watched so it does not get worse. Also, your caregiver can watch for, and treat, any new problems that may come up. Document Released: 03/31/2011 Document Revised: 04/10/2013 Document Reviewed: 12/10/2011 ExitCare Patient Information 2014 [employer].  

## 2014-03-19 NOTE — Progress Notes (Signed)
Gen:  WD, WN, NAD, A&Ox3, pleasant LAB: HCG today is negative Colposcopy Procedure Note  Procedure: Colposcopy for LGSIL Consent: The risks and benefits of the procedure were discussed with the patient. Risks include acute and chronic abdominal and/or pelvic pain, infection (minimized by sterile technique), bleeding (internal and external), and need for subsequent procedure to correct a complication or further treat symptoms. Rarely, damage to internal structures may occur requiring subsequent procedures or, exceedingly rare, repair or removal of the uterus. Benefits may include further evaluation and diagnosis of cervical pathology. The alternatives were explained to the patient including: not doing procedure or postponing procedure. The disadvantages to not doing the procedure were discussed with the patent, including missed diagnosis of cervical cancer The written consent form has been signed and placed in the patient's medical record The patient voiced understanding of the procedure and agreed to proceed. Indication: Cervical dysplasia work up per ASCCP Guidelines Physicians: Dr. Leamon Arnt and Dr. Roselie Awkward Description in detail:   Chaperone present for exam Candace A timeout was completed before the start of the procedure - site verified and documented in the chart.  External genitalia w/o visible lesions. Speculum exam revealed normal appearing cervix.  The cervix was examined using high powered microscopy  Acetic acid applied to cervix with minimal hemorragic change - @12oclock   Lugol's iodine solution not required  The entire transformation zone / squamocolumnar junction was visualized.  Biopsies completed - see graphic  ECC completed EBL: Minimal, <24mL; hemostasis achieved prior to removing speculum Complications: None; Pt. tolerated procedure well. Instructions to patient: Return to clinic vs. ER, depending on severity, with fevers,   increasing pain, or difficulty walking. Expect  mild soreness and spotting for one day.  Call clinic with other questions. Post-procedure handout given to patient. Nothing in vagina for two weeks. 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 Will call pt with results and follow ASCCP guidelines. NOTE: Bx and ECC pending

## 2014-03-24 ENCOUNTER — Encounter: Payer: Self-pay | Admitting: Family Medicine

## 2014-03-26 ENCOUNTER — Telehealth: Payer: Self-pay | Admitting: *Deleted

## 2014-03-26 NOTE — Telephone Encounter (Signed)
Called Maureen Simpson and gave her results and reccomendation for repeat pap/cotesting in one year, March 2016. Maureen Simpson voices understanding.

## 2014-03-26 NOTE — Telephone Encounter (Signed)
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 Please have pt return in 12 months for repeat cotesting. Thank you.            Fredrik Rigger, MD      OB Fellow       ------

## 2014-04-05 ENCOUNTER — Inpatient Hospital Stay (HOSPITAL_COMMUNITY)
Admission: AD | Admit: 2014-04-05 | Discharge: 2014-04-05 | Disposition: A | Discharge status: Home / Self Care | Payer: Medicaid Other | Source: Ambulatory Visit | Attending: Family Medicine | Admitting: Family Medicine

## 2014-04-05 ENCOUNTER — Encounter (HOSPITAL_COMMUNITY): Payer: Self-pay | Admitting: *Deleted

## 2014-04-05 DIAGNOSIS — I10 Essential (primary) hypertension: Secondary | ICD-10-CM

## 2014-04-05 DIAGNOSIS — F17200 Nicotine dependence, unspecified, uncomplicated: Secondary | ICD-10-CM | POA: Insufficient documentation

## 2014-04-05 DIAGNOSIS — R209 Unspecified disturbances of skin sensation: Secondary | ICD-10-CM | POA: Insufficient documentation

## 2014-04-05 DIAGNOSIS — R51 Headache: Secondary | ICD-10-CM | POA: Insufficient documentation

## 2014-04-05 DIAGNOSIS — R030 Elevated blood-pressure reading, without diagnosis of hypertension: Secondary | ICD-10-CM | POA: Insufficient documentation

## 2014-04-05 DIAGNOSIS — R42 Dizziness and giddiness: Secondary | ICD-10-CM | POA: Insufficient documentation

## 2014-04-05 LAB — CBC WITH DIFFERENTIAL/PLATELET
BASOS PCT: 0 % (ref 0–1)
Basophils Absolute: 0 10*3/uL (ref 0.0–0.1)
EOS ABS: 0.2 10*3/uL (ref 0.0–0.7)
Eosinophils Relative: 2 % (ref 0–5)
HCT: 39.1 % (ref 36.0–46.0)
Hemoglobin: 13.7 g/dL (ref 12.0–15.0)
Lymphocytes Relative: 32 % (ref 12–46)
Lymphs Abs: 2.4 10*3/uL (ref 0.7–4.0)
MCH: 31.8 pg (ref 26.0–34.0)
MCHC: 35 g/dL (ref 30.0–36.0)
MCV: 90.7 fL (ref 78.0–100.0)
Monocytes Absolute: 0.4 10*3/uL (ref 0.1–1.0)
Monocytes Relative: 6 % (ref 3–12)
NEUTROS PCT: 60 % (ref 43–77)
Neutro Abs: 4.6 10*3/uL (ref 1.7–7.7)
PLATELETS: 267 10*3/uL (ref 150–400)
RBC: 4.31 MIL/uL (ref 3.87–5.11)
RDW: 12.9 % (ref 11.5–15.5)
WBC: 7.6 10*3/uL (ref 4.0–10.5)

## 2014-04-05 LAB — URINALYSIS, ROUTINE W REFLEX MICROSCOPIC
Bilirubin Urine: NEGATIVE
GLUCOSE, UA: NEGATIVE mg/dL
HGB URINE DIPSTICK: NEGATIVE
KETONES UR: NEGATIVE mg/dL
Leukocytes, UA: NEGATIVE
Nitrite: NEGATIVE
PH: 6 (ref 5.0–8.0)
PROTEIN: NEGATIVE mg/dL
Specific Gravity, Urine: >1.03 — ABNORMAL HIGH (ref 1.005–1.030)
Urobilinogen, UA: 0.2 mg/dL (ref 0.0–1.0)

## 2014-04-05 LAB — BASIC METABOLIC PANEL
BUN: 11 mg/dL (ref 6–23)
CO2: 22 mEq/L (ref 19–32)
Calcium: 8.8 mg/dL (ref 8.4–10.5)
Chloride: 106 mEq/L (ref 96–112)
Creatinine, Ser: 0.69 mg/dL (ref 0.50–1.10)
GFR calc Af Amer: >90 mL/min (ref >90)
GLUCOSE: 108 mg/dL — AB (ref 70–99)
POTASSIUM: 3.9 meq/L (ref 3.7–5.3)
SODIUM: 140 meq/L (ref 137–147)

## 2014-04-05 LAB — POCT PREGNANCY, URINE: Preg Test, Ur: NEGATIVE

## 2014-04-05 MED ORDER — DEXAMETHASONE SODIUM PHOSPHATE 10 MG/ML IJ SOLN
10.0000 mg | Freq: Once | INTRAMUSCULAR | Status: AC
  Administered 2014-04-05: 10 mg via INTRAVENOUS

## 2014-04-05 MED ORDER — DIPHENHYDRAMINE HCL 50 MG/ML IJ SOLN
25.0000 mg | Freq: Once | INTRAMUSCULAR | Status: AC
  Administered 2014-04-05: 25 mg via INTRAVENOUS
  Filled 2014-04-05: qty 1

## 2014-04-05 MED ORDER — METOCLOPRAMIDE HCL 5 MG/ML IJ SOLN
10.0000 mg | Freq: Once | INTRAMUSCULAR | Status: AC
  Administered 2014-04-05: 10 mg via INTRAVENOUS
  Filled 2014-04-05: qty 2

## 2014-04-05 MED ORDER — SODIUM CHLORIDE 0.9 % IV BOLUS (SEPSIS)
1000.0000 mL | Freq: Once | INTRAVENOUS | Status: AC
  Administered 2014-04-05: 1000 mL via INTRAVENOUS

## 2014-04-05 MED ORDER — DEXAMETHASONE SODIUM PHOSPHATE 10 MG/ML IJ SOLN
10.0000 mg | Freq: Once | INTRAMUSCULAR | Status: DC
Start: 2014-04-05 — End: 2014-04-05
  Filled 2014-04-05: qty 1

## 2014-04-05 NOTE — Discharge Instructions (Signed)
It is important that you take your blood pressure medication as directed and follow up with DR. Kennon Rounds. If you develop chest pain, severe headache, nausea, vomiting or other problems go to the Emergency Room at [facility] or [facility].

## 2014-04-05 NOTE — MAU Provider Note (Signed)
CSN: [id number]     Arrival date & time 04/05/14  0915 History   None    Chief Complaint  Patient presents with  . elevated blood pressure      (Consider location/radiation/quality/duration/timing/severity/associated sxs/prior Treatment) Patient is a 38 y.o. female presenting with headaches. The history is provided by the patient.  Headache The primary symptoms include headaches. Primary symptoms do not include fever, nausea or vomiting. The symptoms began 6 to 12 hours ago. The symptoms are worsening. Focality: temporal.  The headache is not associated with photophobia, neck stiffness or loss of balance.  Additional symptoms do not include neck stiffness, lower back pain, leg pain, loss of balance, photophobia, hallucinations, taste disturbance, tinnitus or anxiety.   Maureen Simpson is a 38 y.o. female who presents to the ED with headache that started last night. She went to work this morning and the pain got worse and she left. She was Dx with HTN after she delivered her baby 11/16/2013 and was given Rx for BP medication. She filled her Rx but headaches stopped so she stopped the medication. She describes the headache as pressure at the temporal area. She denies n/v, photophobia or visual changes.   Past Medical History  Diagnosis Date  . Lipoma     right forearm  . No pertinent past medical history   . Asthma   . UTI (lower urinary tract infection)   . Abnormal Pap smear     during preg   Past Surgical History  Procedure Laterality Date  . Lipoma excision  05/26/2012    Procedure: EXCISION LIPOMA;  Surgeon: Gayland Curry, MD,FACS;  Location: [facility];  Service: General;  Laterality: Right;  excision of right proximal forearm lipoma   Family History  Problem Relation Age of Onset  . Hypertension Mother   . Hypertension Maternal Aunt   . Cancer Maternal Grandmother     colon    History  Substance Use Topics  . Smoking status: Current Every Day Smoker --  0.25 packs/day    Types: Cigarettes  . Smokeless tobacco: Never Used  . Alcohol Use: No     Comment: SOCIAL   OB History   Grav Para Term Preterm Abortions TAB SAB Ect Mult Living   1 1 1       1      Review of Systems  Constitutional: Negative for fever and chills.       Slightly elevated BP  HENT: Positive for ear pain. Negative for congestion, sinus pressure, sore throat and tinnitus.   Eyes: Negative for photophobia.  Respiratory: Positive for chest tightness. Negative for cough and shortness of breath.   Cardiovascular: Negative for chest pain.  Gastrointestinal: Negative for nausea, vomiting and abdominal pain.  Genitourinary: Negative for urgency, frequency, vaginal bleeding and vaginal discharge.  Musculoskeletal: Negative for neck stiffness.  Skin: Negative for rash.  Neurological: Positive for light-headedness and headaches. Negative for loss of balance.  Psychiatric/Behavioral: Negative for hallucinations.      Allergies  Review of patient's allergies indicates no known allergies.  Home Medications  No current outpatient prescriptions on file. BP 145/92  Pulse 80  Temp(Src) 97.9 F (36.6 C) (Oral)  Resp 18  Ht 5\' 3"  (1.6 m)  Wt 159 lb 6.4 oz (72.303 kg)  BMI 28.24 kg/m2  SpO2 100% Physical Exam  Constitutional: She is oriented to person, place, and time. She appears well-developed and well-nourished. No distress.  HENT:  Head: Normocephalic and atraumatic.  Right Ear: Tympanic membrane normal.  Left Ear: Tympanic membrane normal.  Nose: Nose normal.  Mouth/Throat: Uvula is midline, oropharynx is clear and moist and mucous membranes are normal.  Eyes: Conjunctivae and EOM are normal.  Neck: Normal range of motion. Neck supple.  Cardiovascular: Normal rate and regular rhythm.   Pulmonary/Chest: Effort normal. She has no wheezes. She has no rales.  Abdominal: Soft. Bowel sounds are normal. She exhibits no mass. There is no tenderness.  Musculoskeletal:  She exhibits no edema.  Radial and pedal pulses strong, adequate circulation, good touch sensation.  Neurological: She is alert and oriented to person, place, and time. She has normal strength. No cranial nerve deficit or sensory deficit. She displays a negative Romberg sign. Coordination and gait normal.  Reflex Scores:      Bicep reflexes are 2+ on the right side and 2+ on the left side.      Brachioradialis reflexes are 2+ on the right side and 2+ on the left side.      Patellar reflexes are 2+ on the right side and 2+ on the left side.      Achilles reflexes are 2+ on the right side and 2+ on the left side. Rapid alternating movement without difficulty. Stands on one foot without difficulty. Ambulatory with steady gait and no foot drag.  Skin: Skin is warm and dry.  Psychiatric: She has a normal mood and affect. Her behavior is normal.   Results for orders placed during the hospital encounter of 04/05/14 (from the past 24 hour(s))  URINALYSIS, ROUTINE W REFLEX MICROSCOPIC     Status: Abnormal   Collection Time    04/05/14  9:25 AM      Result Value Ref Range   Color, Urine YELLOW  YELLOW   APPearance HAZY (*) CLEAR   Specific Gravity, Urine >1.030 (*) 1.005 - 1.030   pH 6.0  5.0 - 8.0   Glucose, UA NEGATIVE  NEGATIVE mg/dL   Hgb urine dipstick NEGATIVE  NEGATIVE   Bilirubin Urine NEGATIVE  NEGATIVE   Ketones, ur NEGATIVE  NEGATIVE mg/dL   Protein, ur NEGATIVE  NEGATIVE mg/dL   Urobilinogen, UA 0.2  0.0 - 1.0 mg/dL   Nitrite NEGATIVE  NEGATIVE   Leukocytes, UA NEGATIVE  NEGATIVE  POCT PREGNANCY, URINE     Status: None   Collection Time    04/05/14  9:37 AM      Result Value Ref Range   Preg Test, Ur NEGATIVE  NEGATIVE  CBC WITH DIFFERENTIAL     Status: None   Collection Time    04/05/14 10:25 AM      Result Value Ref Range   WBC 7.6  4.0 - 10.5 K/uL   RBC 4.31  3.87 - 5.11 MIL/uL   Hemoglobin 13.7  12.0 - 15.0 g/dL   HCT 39.1  36.0 - 46.0 %   MCV 90.7  78.0 - 100.0 fL    MCH 31.8  26.0 - 34.0 pg   MCHC 35.0  30.0 - 36.0 g/dL   RDW 12.9  11.5 - 15.5 %   Platelets 267  150 - 400 K/uL   Neutrophils Relative % 60  43 - 77 %   Neutro Abs 4.6  1.7 - 7.7 K/uL   Lymphocytes Relative 32  12 - 46 %   Lymphs Abs 2.4  0.7 - 4.0 K/uL   Monocytes Relative 6  3 - 12 %   Monocytes Absolute 0.4  0.1 - 1.0  K/uL   Eosinophils Relative 2  0 - 5 %   Eosinophils Absolute 0.2  0.0 - 0.7 K/uL   Basophils Relative 0  0 - 1 %   Basophils Absolute 0.0  0.0 - 0.1 K/uL  BASIC METABOLIC PANEL     Status: Abnormal   Collection Time    04/05/14 11:20 AM      Result Value Ref Range   Sodium 140  137 - 147 mEq/L   Potassium 3.9  3.7 - 5.3 mEq/L   Chloride 106  96 - 112 mEq/L   CO2 22  19 - 32 mEq/L   Glucose, Bld 108 (*) 70 - 99 mg/dL   BUN 11  6 - 23 mg/dL   Creatinine, Ser 0.69  0.50 - 1.10 mg/dL   Calcium 8.8  8.4 - 10.5 mg/dL   GFR calc non Af Amer >90  >90 mL/min   GFR calc Af Amer >90  >90 mL/min   EKG: normal sinus rhythm. Anteroseptal infarct,    ED Course  Procedures  IV hydration, Decadron 10 mg, Benadryl 25 mg, Reglan 10 mg. IV, EKG MDM: Dr. Nehemiah Settle here to read the EKG, I discussed the case with him  38 y.o. female with headache in the temporal area and feeling light headed that started last night and got worse this morning. Pain down to 0/10 after treatment in MAU. Discussed in detail with the patient need for taking her BP medication and follow up with her doctor. She voices understanding. She will go to [facility] or [facility] if she develops chest pain, n/v or shortness of breath. I have reviewed this patient's vital signs, nurses notes, appropriate labs and imaging. Stable for discharge without chest pain or headache. I have reviewed this patient's vital signs, nurses notes, appropriate labs and imaging.  I have discussed findings and plan of care with the patient and she voices understanding.    Medication List    ASK your doctor about these medications        diphenhydramine-acetaminophen 25-500 MG Tabs  Commonly known as:  TYLENOL PM  Take 2 tablets by mouth at bedtime as needed. For sleep     prenatal multivitamin Tabs tablet  Take 1 tablet by mouth daily at 12 noon.

## 2014-04-05 NOTE — MAU Note (Signed)
Patient presents to MAU c/o elevated blood pressure. Did not take blood pressure at home. Reports having a migraine 10/10 on pain scale. Reports occasional numbness in arms. Currently not taking blood pressure medication; does have a prescription.

## 2014-04-06 NOTE — MAU Provider Note (Signed)
Attestation of Attending Supervision of Advanced Practitioner (PA/CNM/NP): Evaluation and management procedures were performed by the Advanced Practitioner under my supervision and collaboration.  I have reviewed the Advanced Practitioner's note and chart, and I agree with the management and plan.  Shyanne Mcclary, DO Attending Physician Faculty Practice, [facility]  

## 2014-04-23 ENCOUNTER — Encounter: Payer: Self-pay | Admitting: *Deleted

## 2014-07-06 ENCOUNTER — Encounter (HOSPITAL_COMMUNITY): Payer: Self-pay | Admitting: *Deleted

## 2014-07-06 ENCOUNTER — Telehealth: Payer: Self-pay | Admitting: *Deleted

## 2014-07-06 ENCOUNTER — Inpatient Hospital Stay (HOSPITAL_COMMUNITY)
Admission: AD | Admit: 2014-07-06 | Discharge: 2014-07-06 | Disposition: A | Discharge status: Home / Self Care | Payer: Medicaid Other | Source: Ambulatory Visit | Attending: Obstetrics & Gynecology | Admitting: Obstetrics & Gynecology

## 2014-07-06 DIAGNOSIS — IMO0002 Reserved for concepts with insufficient information to code with codable children: Secondary | ICD-10-CM | POA: Insufficient documentation

## 2014-07-06 DIAGNOSIS — N760 Acute vaginitis: Secondary | ICD-10-CM | POA: Diagnosis not present

## 2014-07-06 DIAGNOSIS — A499 Bacterial infection, unspecified: Secondary | ICD-10-CM | POA: Diagnosis not present

## 2014-07-06 DIAGNOSIS — F17200 Nicotine dependence, unspecified, uncomplicated: Secondary | ICD-10-CM | POA: Diagnosis not present

## 2014-07-06 DIAGNOSIS — B9689 Other specified bacterial agents as the cause of diseases classified elsewhere: Secondary | ICD-10-CM | POA: Diagnosis not present

## 2014-07-06 DIAGNOSIS — Z8249 Family history of ischemic heart disease and other diseases of the circulatory system: Secondary | ICD-10-CM | POA: Diagnosis not present

## 2014-07-06 LAB — POCT PREGNANCY, URINE: Preg Test, Ur: NEGATIVE

## 2014-07-06 LAB — WET PREP, GENITAL
Trich, Wet Prep: NONE SEEN
YEAST WET PREP: NONE SEEN

## 2014-07-06 MED ORDER — METRONIDAZOLE 500 MG PO TABS: 500.0000 mg | ORAL_TABLET | Freq: Two times a day (BID) | ORAL | Status: DC

## 2014-07-06 NOTE — MAU Note (Signed)
Painful intercourse last night, felt for strings- can feel all of IUD coming out of cervix

## 2014-07-06 NOTE — Telephone Encounter (Signed)
Patient called and stated that it feels like her Verdis Frederickson is coming out but its not all the way out. It is uncomfortable. I advised that she go to MAU to have this evaluated and that they can remove it the rest of the way if needed. Patient agrees.

## 2014-07-06 NOTE — Discharge Instructions (Signed)
Bacterial Vaginosis Bacterial vaginosis is a vaginal infection that occurs when the normal balance of bacteria in the vagina is disrupted. It results from an overgrowth of certain bacteria. This is the most common vaginal infection in women of childbearing age. Treatment is important to prevent complications, especially in pregnant women, as it can cause a premature delivery. CAUSES  Bacterial vaginosis is caused by an increase in harmful bacteria that are normally present in smaller amounts in the vagina. Several different kinds of bacteria can cause bacterial vaginosis. However, the reason that the condition develops is not fully understood. RISK FACTORS Certain activities or behaviors can put you at an increased risk of developing bacterial vaginosis, including:  Having a new sex partner or multiple sex partners.  Douching.  Using an intrauterine device (IUD) for contraception. Women do not get bacterial vaginosis from toilet seats, bedding, swimming pools, or contact with objects around them. SIGNS AND SYMPTOMS  Some women with bacterial vaginosis have no signs or symptoms. Common symptoms include:  Grey vaginal discharge.  A fishlike odor with discharge, especially after sexual intercourse.  Itching or burning of the vagina and vulva.  Burning or pain with urination. DIAGNOSIS  Your health care provider will take a medical history and examine the vagina for signs of bacterial vaginosis. A sample of vaginal fluid may be taken. Your health care provider will look at this sample under a microscope to check for bacteria and abnormal cells. A vaginal pH test may also be done.  TREATMENT  Bacterial vaginosis may be treated with antibiotic medicines. These may be given in the form of a pill or a vaginal cream. A second round of antibiotics may be prescribed if the condition comes back after treatment.  HOME CARE INSTRUCTIONS   Only take over-the-counter or prescription medicines as  directed by your health care provider.  If antibiotic medicine was prescribed, take it as directed. Make sure you finish it even if you start to feel better.  Do not have sex until treatment is completed.  Tell all sexual partners that you have a vaginal infection. They should see their health care provider and be treated if they have problems, such as a mild rash or itching.  Practice safe sex by using condoms and only having one sex partner. SEEK MEDICAL CARE IF:   Your symptoms are not improving after 3 days of treatment.  You have increased discharge or pain.  You have a fever. MAKE SURE YOU:   Understand these instructions.  Will watch your condition.  Will get help right away if you are not doing well or get worse. FOR MORE INFORMATION  Centers for Disease Control and Prevention, Division of STD Prevention: www.cdc.gov/std American Sexual Health Association (ASHA): www.ashastd.org  Document Released: 12/14/2005 Document Revised: 10/04/2013 Document Reviewed: 07/26/2013 ExitCare Patient Information 2015 [employer]. This information is not intended to replace advice given to you by your health care provider. Make sure you discuss any questions you have with your health care provider.  

## 2014-07-06 NOTE — MAU Note (Signed)
Urine in lab 

## 2014-07-06 NOTE — MAU Provider Note (Signed)
History     CSN: [id number]  Arrival date and time: 07/06/14 1547   First Provider Initiated Contact with Patient 07/06/14 1642      Chief Complaint  Patient presents with  . painful intercourse    HPI  Maureen Simpson is a 38 y.o. G1P1001 who presents today because she thinks her IUD is falling out. She states that last night she had pain with intercourse, and then had a fishy odor after intercourse. Then she went to feel the string of her IUD and states that she felt "something hard like plastic".   Past Medical History  Diagnosis Date  . Lipoma     right forearm  . No pertinent past medical history   . Asthma   . UTI (lower urinary tract infection)   . Abnormal Pap smear     during preg    Past Surgical History  Procedure Laterality Date  . Lipoma excision  05/26/2012    Procedure: EXCISION LIPOMA;  Surgeon: Gayland Curry, MD,FACS;  Location: [place];  Service: General;  Laterality: Right;  excision of right proximal forearm lipoma    Family History  Problem Relation Age of Onset  . Hypertension Mother   . Hypertension Maternal Aunt   . Cancer Maternal Grandmother     colon     History  Substance Use Topics  . Smoking status: Current Every Day Smoker -- 0.25 packs/day    Types: Cigarettes  . Smokeless tobacco: Never Used  . Alcohol Use: No     Comment: SOCIAL    Allergies: No Known Allergies  Prescriptions prior to admission  Medication Sig Dispense Refill  . diphenhydramine-acetaminophen (TYLENOL PM) 25-500 MG TABS Take 2 tablets by mouth at bedtime as needed. For sleep      . Prenatal Vit-Fe Fumarate-FA (PRENATAL MULTIVITAMIN) TABS tablet Take 1 tablet by mouth daily at 12 noon.        ROS Physical Exam   Blood pressure 130/87, pulse 79, temperature 98.8 F (37.1 C), temperature source Oral, resp. rate 18, height 5\' 3"  (1.6 m), weight 70.761 kg (156 lb), not currently breastfeeding.  Physical Exam  Nursing note and vitals  reviewed. Constitutional: She is oriented to person, place, and time. She appears well-developed and well-nourished. No distress.  Cardiovascular: Normal rate.   Respiratory: Effort normal.  GI: Soft. There is no tenderness. There is no rebound.  Genitourinary:   External: no lesion Vagina: small amount of white discharge Cervix: pink, smooth, no CMT, IUD tail seen at os, no part of the IUD is visible. Cannot palpate any part of the IUD.  Uterus: NSSC Adnexa: NT   Neurological: She is alert and oriented to person, place, and time.  Skin: Skin is warm and dry.  Psychiatric: She has a normal mood and affect.    MAU Course  Procedures  Results for orders placed during the hospital encounter of 07/06/14 (from the past 24 hour(s))  POCT PREGNANCY, URINE     Status: None   Collection Time    07/06/14  4:31 PM      Result Value Ref Range   Preg Test, Ur NEGATIVE  NEGATIVE  WET PREP, GENITAL     Status: Abnormal   Collection Time    07/06/14  4:52 PM      Result Value Ref Range   Yeast Wet Prep HPF POC NONE SEEN  NONE SEEN   Trich, Wet Prep NONE SEEN  NONE SEEN  Clue Cells Wet Prep HPF POC FEW (*) NONE SEEN   WBC, Wet Prep HPF POC FEW (*) NONE SEEN     Assessment and Plan   1. BV (bacterial vaginosis)    IUD appears in situ Will treat BV Return to MAU as needed FU with the clinic as needed  Follow-up Information   Follow up with [facility]. (As needed)    Specialty:  Obstetrics and Gynecology   Contact information:   [place] [address] [phone]       Mathis Bud 07/06/2014, 4:55 PM

## 2014-07-06 NOTE — MAU Provider Note (Signed)
Attestation of Attending Supervision of Advanced Practitioner (PA/CNM/NP): Evaluation and management procedures were performed by the Advanced Practitioner under my supervision and collaboration.  I have reviewed the Advanced Practitioner's note and chart, and I agree with the management and plan.  Xavian Hardcastle, MD, FACOG Attending Obstetrician & Gynecologist Faculty Practice, [facility]   

## 2014-07-25 ENCOUNTER — Telehealth: Payer: Self-pay | Admitting: *Deleted

## 2014-07-25 NOTE — Telephone Encounter (Signed)
Lurae called and left a message stating she wants a call because she thinks she has depression. Called Lameshia and she reports her symptoms are crying all the time, not feeling happy. Denies thoughts of harming self or baby.  Gave her phone number for [facility] [phone] and instructed her may call at any time and may call to talk to someone and get an appointment to see someone regardless of ability to pay. Also informed her she can go to [facility] ER at any time if she is having thoughts of harming self or baby or just really needs to be seen.  Renia voiced understanding.

## 2014-09-14 ENCOUNTER — Encounter: Payer: Self-pay | Admitting: Medical

## 2014-09-14 ENCOUNTER — Ambulatory Visit (INDEPENDENT_AMBULATORY_CARE_PROVIDER_SITE_OTHER): Payer: Medicaid Other | Admitting: Medical

## 2014-09-14 VITALS — BP 127/85 | HR 75 | Ht 63.0 in | Wt 155.2 lb

## 2014-09-14 DIAGNOSIS — N898 Other specified noninflammatory disorders of vagina: Secondary | ICD-10-CM

## 2014-09-14 DIAGNOSIS — Z30432 Encounter for removal of intrauterine contraceptive device: Secondary | ICD-10-CM

## 2014-09-14 DIAGNOSIS — Z30011 Encounter for initial prescription of contraceptive pills: Secondary | ICD-10-CM

## 2014-09-14 MED ORDER — NORGESTIMATE-ETH ESTRADIOL 0.25-35 MG-MCG PO TABS: 1.0000 | ORAL_TABLET | Freq: Every day | ORAL | Status: DC

## 2014-09-14 NOTE — Patient Instructions (Signed)

## 2014-09-14 NOTE — Progress Notes (Signed)
Patient ID: Maureen Simpson, female   DOB: [birth year], 38 y.o.   MRN: [id number]     GYNECOLOGY CLINIC PROCEDURE NOTE  Maureen Simpson is a 38 y.o. G1P1001 here for Mirena IUD removal. No GYN concerns.  Last pap smear was on 12/18/13 and showed LSIL +HPV. 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 Will have pap smear with cotesting 12/18/14.   IUD Removal  Patient was in the dorsal lithotomy position, normal external genitalia was noted.  A speculum was placed in the patient's vagina, normal discharge was noted, no lesions. The multiparous cervix was visualized, no lesions, no abnormal discharge.  The strings of the IUD were grasped and pulled using ring forceps. The IUD was removed in its entirety. Patient tolerated the procedure well.    Patient will use OCPs for contraception. Patient will return to [facility] in 1 month for BP check after starting OCPs.  Routine preventative health maintenance measures emphasized.  Luvenia Redden, PA-C 09/14/2014 11:03 AM

## 2014-09-15 ENCOUNTER — Other Ambulatory Visit: Payer: Self-pay | Admitting: Medical

## 2014-09-15 DIAGNOSIS — N760 Acute vaginitis: Principal | ICD-10-CM

## 2014-09-15 DIAGNOSIS — B9689 Other specified bacterial agents as the cause of diseases classified elsewhere: Secondary | ICD-10-CM

## 2014-09-15 LAB — WET PREP, GENITAL
TRICH WET PREP: NONE SEEN
YEAST WET PREP: NONE SEEN

## 2014-09-15 MED ORDER — METRONIDAZOLE 500 MG PO TABS: 500.0000 mg | ORAL_TABLET | Freq: Two times a day (BID) | ORAL | Status: DC

## 2014-09-17 ENCOUNTER — Telehealth: Payer: Self-pay | Admitting: *Deleted

## 2014-09-17 NOTE — Progress Notes (Signed)
Called pt and informed her of wet prep results indicating BV. Pt stated she has had this problem before and had no questions. A prescription has been sent to her pharmacy and is ready for pick up. Pt voiced understanding.

## 2014-09-17 NOTE — Telephone Encounter (Signed)
Patient called regarding her results. I called patient back and she stated that someone else has already helped her. She had  No further questions.

## 2014-10-12 ENCOUNTER — Ambulatory Visit: Payer: Medicaid Other

## 2014-10-19 ENCOUNTER — Ambulatory Visit: Payer: Medicaid Other

## 2014-10-26 ENCOUNTER — Encounter: Payer: Self-pay | Admitting: General Practice

## 2014-10-29 ENCOUNTER — Encounter: Payer: Self-pay | Admitting: Medical

## 2014-11-02 ENCOUNTER — Other Ambulatory Visit: Payer: Self-pay | Admitting: Medical

## 2014-11-05 ENCOUNTER — Other Ambulatory Visit: Payer: Self-pay | Admitting: General Practice

## 2014-11-05 DIAGNOSIS — Z30011 Encounter for initial prescription of contraceptive pills: Secondary | ICD-10-CM

## 2014-11-05 NOTE — Telephone Encounter (Signed)
Called patient and she states she checked her blood pressure and it was 120/80 and really needs the pills. Discussed with Kerry Hough, patient needs to come in for blood pressure check either here or at a PCP and they can give her the pills, whichever is more convenient but the patient cannot get any more pills until she comes in for a blood pressure check due to risk of stroke or blood clot if patient does have high blood pressure and on OCPs. Patient may get a one month supply but that is it. Called patient, no answer- left message that I am calling her back to return her call, please call us back.

## 2014-11-05 NOTE — Telephone Encounter (Signed)
Patient called and left message stating she needs a refill on her birth control pills. States she called the pharmacy but was told she would need an appt with [place] and that her blood pressure is fine so can she just get a refill. Called patient back and she states that her sister is a cna and she had her check her blood pressure and it was fine and her car is in the shop and it's going to cost her a lot of money to get transportation here and she will have to take off work. Told patient to [facility] a [facility], take her blood pressure and call us back with what her blood pressure is and I will speak with her provider this afternoon. Patient verbalized understanding and had no other questions. Patient to call back.

## 2014-11-06 MED ORDER — NORGESTIMATE-ETH ESTRADIOL 0.25-35 MG-MCG PO TABS: 1.0000 | ORAL_TABLET | Freq: Every day | ORAL | Status: DC

## 2014-11-06 NOTE — Telephone Encounter (Signed)
Patient called and left message stating she is returning our call. Called patient back and discussed with her that she needs to come in for a BP check and that it is a quick 5 minute visit and if she has to be at work by 8 she can always come when we open at 730 on Monday, Wednesday or Thursday and discussed risk of OCPs and high blood pressure. Patient verbalized understanding and states that she will be out of the pills but gets her car back on the 19th so she will call then for an appt. Told patient we will send one additional refill to her pharmacy but that will be it until she comes in for a BP check. Patient verbalized understanding and had no other questions

## 2014-11-11 ENCOUNTER — Inpatient Hospital Stay (HOSPITAL_COMMUNITY)
Admission: AD | Admit: 2014-11-11 | Discharge: 2014-11-11 | Disposition: A | Discharge status: Home / Self Care | Payer: Self-pay | Source: Ambulatory Visit | Attending: Emergency Medicine | Admitting: Emergency Medicine

## 2014-11-11 ENCOUNTER — Inpatient Hospital Stay (HOSPITAL_COMMUNITY): Payer: Medicaid Other

## 2014-11-11 ENCOUNTER — Encounter (HOSPITAL_COMMUNITY): Payer: Self-pay | Admitting: *Deleted

## 2014-11-11 DIAGNOSIS — Z79899 Other long term (current) drug therapy: Secondary | ICD-10-CM | POA: Insufficient documentation

## 2014-11-11 DIAGNOSIS — R1084 Generalized abdominal pain: Secondary | ICD-10-CM | POA: Insufficient documentation

## 2014-11-11 DIAGNOSIS — Z3202 Encounter for pregnancy test, result negative: Secondary | ICD-10-CM | POA: Insufficient documentation

## 2014-11-11 DIAGNOSIS — R1033 Periumbilical pain: Secondary | ICD-10-CM | POA: Insufficient documentation

## 2014-11-11 DIAGNOSIS — Z87440 Personal history of urinary (tract) infections: Secondary | ICD-10-CM | POA: Insufficient documentation

## 2014-11-11 DIAGNOSIS — J45909 Unspecified asthma, uncomplicated: Secondary | ICD-10-CM | POA: Insufficient documentation

## 2014-11-11 DIAGNOSIS — N739 Female pelvic inflammatory disease, unspecified: Secondary | ICD-10-CM | POA: Insufficient documentation

## 2014-11-11 DIAGNOSIS — R109 Unspecified abdominal pain: Secondary | ICD-10-CM

## 2014-11-11 DIAGNOSIS — Z720 Tobacco use: Secondary | ICD-10-CM | POA: Insufficient documentation

## 2014-11-11 DIAGNOSIS — Z792 Long term (current) use of antibiotics: Secondary | ICD-10-CM | POA: Insufficient documentation

## 2014-11-11 LAB — URINALYSIS, ROUTINE W REFLEX MICROSCOPIC
Glucose, UA: NEGATIVE mg/dL
KETONES UR: 15 mg/dL — AB
NITRITE: NEGATIVE
PH: 6 (ref 5.0–8.0)
PROTEIN: 30 mg/dL — AB
Specific Gravity, Urine: >1.03 — ABNORMAL HIGH (ref 1.005–1.030)
Urobilinogen, UA: 1 mg/dL (ref 0.0–1.0)

## 2014-11-11 LAB — CBC
HCT: 38.1 % (ref 36.0–46.0)
Hemoglobin: 13.1 g/dL (ref 12.0–15.0)
MCH: 32.3 pg (ref 26.0–34.0)
MCHC: 34.4 g/dL (ref 30.0–36.0)
MCV: 93.8 fL (ref 78.0–100.0)
Platelets: 263 10*3/uL (ref 150–400)
RBC: 4.06 MIL/uL (ref 3.87–5.11)
RDW: 12.9 % (ref 11.5–15.5)
WBC: 20.1 10*3/uL — ABNORMAL HIGH (ref 4.0–10.5)

## 2014-11-11 LAB — URINE MICROSCOPIC-ADD ON

## 2014-11-11 LAB — WET PREP, GENITAL
Clue Cells Wet Prep HPF POC: NONE SEEN
Trich, Wet Prep: NONE SEEN
Yeast Wet Prep HPF POC: NONE SEEN

## 2014-11-11 LAB — POCT PREGNANCY, URINE: PREG TEST UR: NEGATIVE

## 2014-11-11 MED ORDER — IOHEXOL 300 MG/ML  SOLN
100.0000 mL | Freq: Once | INTRAMUSCULAR | Status: AC | PRN
  Administered 2014-11-11: 100 mL via INTRAVENOUS

## 2014-11-11 MED ORDER — IOHEXOL 300 MG/ML  SOLN
50.0000 mL | Freq: Once | INTRAMUSCULAR | Status: AC | PRN
  Administered 2014-11-11: 50 mL via ORAL

## 2014-11-11 MED ORDER — CEFTRIAXONE SODIUM 250 MG IJ SOLR
250.0000 mg | Freq: Once | INTRAMUSCULAR | Status: AC
  Administered 2014-11-11: 250 mg via INTRAMUSCULAR
  Filled 2014-11-11: qty 250

## 2014-11-11 MED ORDER — SODIUM CHLORIDE 0.9 % IV BOLUS (SEPSIS)
1000.0000 mL | Freq: Once | INTRAVENOUS | Status: AC
  Administered 2014-11-11: 1000 mL via INTRAVENOUS

## 2014-11-11 MED ORDER — OXYCODONE-ACETAMINOPHEN 5-325 MG PO TABS: 1.0000 | ORAL_TABLET | Freq: Four times a day (QID) | ORAL | Status: DC | PRN

## 2014-11-11 MED ORDER — MORPHINE SULFATE 4 MG/ML IJ SOLN
4.0000 mg | Freq: Once | INTRAMUSCULAR | Status: AC
  Administered 2014-11-11: 4 mg via INTRAVENOUS
  Filled 2014-11-11: qty 1

## 2014-11-11 MED ORDER — DOXYCYCLINE HYCLATE 100 MG PO CAPS: 100.0000 mg | ORAL_CAPSULE | Freq: Two times a day (BID) | ORAL | Status: DC

## 2014-11-11 NOTE — ED Provider Notes (Signed)
CSN: [id number]     Arrival date & time 11/11/14  1508 History   First MD Initiated Contact with Patient 11/11/14 2033     Chief Complaint  Patient presents with  . Abdominal Pain     (Consider location/radiation/quality/duration/timing/severity/associated sxs/prior Treatment) HPI  38 year old female presents with abdominal pain that started yesterday. She states started all of a sudden and has been worsening. She took Tylenol with no relief. She went to [facility] be evaluated in the center here to rule out appendicitis. There they did a pelvic exam and found discharge and are treating her for PID. Patient states that when they did the pelvic exam it reproduced her pain. She's having diffuse lower abdominal pain. No fevers. No nausea or vomiting. Patient rates her pain as severe at this time.  Past Medical History  Diagnosis Date  . Lipoma     right forearm  . No pertinent past medical history   . Asthma   . UTI (lower urinary tract infection)   . Abnormal Pap smear     during preg   Past Surgical History  Procedure Laterality Date  . Lipoma excision  05/26/2012    Procedure: EXCISION LIPOMA;  Surgeon: Gayland Curry, MD,FACS;  Location: [place];  Service: General;  Laterality: Right;  excision of right proximal forearm lipoma   Family History  Problem Relation Age of Onset  . Hypertension Mother   . Hypertension Maternal Aunt   . Cancer Maternal Grandmother     colon    History  Substance Use Topics  . Smoking status: Current Every Day Smoker -- 0.25 packs/day    Types: Cigarettes  . Smokeless tobacco: Never Used  . Alcohol Use: No     Comment: SOCIAL   OB History    Gravida Para Term Preterm AB TAB SAB Ectopic Multiple Living   1 1 1       1      Review of Systems  Constitutional: Negative for fever.  Gastrointestinal: Positive for abdominal pain. Negative for nausea and vomiting.  Genitourinary: Negative for dysuria.  All other systems  reviewed and are negative.     Allergies  Review of patient's allergies indicates no known allergies.  Home Medications   Prior to Admission medications   Medication Sig Start Date End Date Taking? Authorizing Provider  acetaminophen (TYLENOL) 500 MG tablet Take 1,000 mg by mouth every 6 (six) hours as needed for mild pain.   Yes Historical Provider, MD  Ibuprofen-Diphenhydramine HCl (ADVIL PM) 200-25 MG CAPS Take 1 tablet by mouth at bedtime as needed (sleep).   Yes Historical Provider, MD  norgestimate-ethinyl estradiol (ORTHO-CYCLEN,SPRINTEC,PREVIFEM) 0.25-35 MG-MCG tablet Take 1 tablet by mouth daily. 11/06/14  Yes Luvenia Redden, PA-C  doxycycline (VIBRAMYCIN) 100 MG capsule Take 1 capsule (100 mg total) by mouth 2 (two) times daily. 11/11/14   Elesa Massed, NP  ibuprofen (ADVIL,MOTRIN) 200 MG tablet Take 200 mg by mouth every 6 (six) hours as needed for headache.    Historical Provider, MD  levonorgestrel (MIRENA) 20 MCG/24HR IUD 1 each by Intrauterine route once.    Historical Provider, MD  metroNIDAZOLE (FLAGYL) 500 MG tablet Take 1 tablet (500 mg total) by mouth 2 (two) times daily. Patient not taking: Reported on 11/11/2014 09/15/14   Luvenia Redden, PA-C  OVER THE COUNTER MEDICATION Take 1 tablet by mouth daily. Patient takes [place].    Historical Provider, MD  Prenatal Vit-Fe Fumarate-FA (  PRENATAL MULTIVITAMIN) TABS tablet Take 1 tablet by mouth daily at 12 noon.    Historical Provider, MD   BP 129/79 mmHg  Pulse 102  Temp(Src) 99.7 F (37.6 C) (Oral)  Resp 18  Ht 5' 3.25" (1.607 m)  Wt 145 lb 4 oz (65.885 kg)  BMI 25.51 kg/m2  SpO2 98%  LMP 11/02/2014 Physical Exam  Constitutional: She is oriented to person, place, and time. She appears well-developed and well-nourished.  HENT:  Head: Normocephalic and atraumatic.  Right Ear: External ear normal.  Left Ear: External ear normal.  Nose: Nose normal.  Eyes: Right eye exhibits no  discharge. Left eye exhibits no discharge.  Cardiovascular: Normal rate, regular rhythm and normal heart sounds.   Pulmonary/Chest: Effort normal and breath sounds normal.  Abdominal: Soft. There is tenderness in the right lower quadrant, periumbilical area, suprapubic area and left lower quadrant. There is guarding. There is no rebound.  Neurological: She is alert and oriented to person, place, and time.  Skin: Skin is warm and dry.  Nursing note and vitals reviewed.   ED Course  Procedures (including critical care time) Labs Review Labs Reviewed  WET PREP, GENITAL - Abnormal; Notable for the following:    WBC, Wet Prep HPF POC TOO NUMEROUS TO COUNT (*)    All other components within normal limits  URINALYSIS, ROUTINE W REFLEX MICROSCOPIC - Abnormal; Notable for the following:    Specific Gravity, Urine >1.030 (*)    Hgb urine dipstick TRACE (*)    Bilirubin Urine SMALL (*)    Ketones, ur 15 (*)    Protein, ur 30 (*)    Leukocytes, UA TRACE (*)    All other components within normal limits  URINE MICROSCOPIC-ADD ON - Abnormal; Notable for the following:    Squamous Epithelial / LPF MANY (*)    All other components within normal limits  CBC - Abnormal; Notable for the following:    WBC 20.1 (*)    All other components within normal limits  GC/CHLAMYDIA PROBE AMP  POCT PREGNANCY, URINE    Imaging Review Ct Abdomen Pelvis W Contrast  11/11/2014   CLINICAL DATA:  Abdominal pain for 2 days  EXAM: CT ABDOMEN AND PELVIS WITH CONTRAST  TECHNIQUE: Multidetector CT imaging of the abdomen and pelvis was performed using the standard protocol following bolus administration of intravenous contrast.  CONTRAST:  80mL OMNIPAQUE IOHEXOL 300 MG/ML SOLN, 158mL OMNIPAQUE IOHEXOL 300 MG/ML SOLN  COMPARISON:  None.  FINDINGS: Lung bases are free of acute infiltrate or sizable effusion.  The liver, gallbladder, spleen, adrenal glands and pancreas are within normal limits. The kidneys are well visualized  bilaterally and reveal a few small cysts. No renal calculi or obstructive changes are noted.  The appendix is within normal limits. The ascending colon demonstrates some very mild pericolonic inflammatory change. This may represent some focal colitis.  The bladder is well distended. No free pelvic fluid is seen. The osseous structures are within normal limits.  IMPRESSION: Very mild changes of colitis in the ascending colon. The appendix is within normal limits.  No other focal abnormality is noted.   Electronically Signed   By: Inez Catalina M.D.   On: 11/11/2014 22:29     EKG Interpretation None      MDM   Final diagnoses:  Periumbilical abdominal pain  Abdominal pain  Pelvic inflammatory disease    Given the degree of tenderness on her dominant exam, including in the right lower quadrant, a  CT was obtained to rule out appendicitis versus TOA. CT is unremarkable and shows no masses or infection. At this point I feel all her symptoms are likely from PID. Her pain has improved in the ER. She has a doxycycline prescription from [facility] hospital and was advised to finish this to completion. Discussed strict return precautions and will DC with oral pain control.    Ephraim Hamburger, MD 11/11/14 [phone]

## 2014-11-11 NOTE — ED Notes (Signed)
Awake. Verbally responsive. Resp even and unlabored. ABC's intact. Abd soft/nondistended/nontender. No N/V/D reported. LBM 11/11/2014 with normal consistency.

## 2014-11-11 NOTE — Discharge Instructions (Signed)
Abdominal Pain, Women °Abdominal (stomach, pelvic, or belly) pain can be caused by many things. It is important to tell your doctor: °· The location of the pain. °· Does it come and go or is it present all the time? °· Are there things that start the pain (eating certain foods, exercise)? °· Are there other symptoms associated with the pain (fever, nausea, vomiting, diarrhea)? °All of this is helpful to know when trying to find the cause of the pain. °CAUSES  °· Stomach: virus or bacteria infection, or ulcer. °· Intestine: appendicitis (inflamed appendix), regional ileitis (Crohn's disease), ulcerative colitis (inflamed colon), irritable bowel syndrome, diverticulitis (inflamed diverticulum of the colon), or cancer of the stomach or intestine. °· Gallbladder disease or stones in the gallbladder. °· Kidney disease, kidney stones, or infection. °· Pancreas infection or cancer. °· Fibromyalgia (pain disorder). °· Diseases of the female organs: °· Uterus: fibroid (non-cancerous) tumors or infection. °· Fallopian tubes: infection or tubal pregnancy. °· Ovary: cysts or tumors. °· Pelvic adhesions (scar tissue). °· Endometriosis (uterus lining tissue growing in the pelvis and on the pelvic organs). °· Pelvic congestion syndrome (female organs filling up with blood just before the menstrual period). °· Pain with the menstrual period. °· Pain with ovulation (producing an egg). °· Pain with an IUD (intrauterine device, birth control) in the uterus. °· Cancer of the female organs. °· Functional pain (pain not caused by a disease, may improve without treatment). °· Psychological pain. °· Depression. °DIAGNOSIS  °Your doctor will decide the seriousness of your pain by doing an examination. °· Blood tests. °· X-rays. °· Ultrasound. °· CT scan (computed tomography, special type of X-ray). °· MRI (magnetic resonance imaging). °· Cultures, for infection. °· Barium enema (dye inserted in the large intestine, to better view it with  X-rays). °· Colonoscopy (looking in intestine with a lighted tube). °· Laparoscopy (minor surgery, looking in abdomen with a lighted tube). °· Major abdominal exploratory surgery (looking in abdomen with a large incision). °TREATMENT  °The treatment will depend on the cause of the pain.  °· Many cases can be observed and treated at home. °· Over-the-counter medicines recommended by your caregiver. °· Prescription medicine. °· Antibiotics, for infection. °· Birth control pills, for painful periods or for ovulation pain. °· Hormone treatment, for endometriosis. °· Nerve blocking injections. °· Physical therapy. °· Antidepressants. °· Counseling with a psychologist or psychiatrist. °· Minor or major surgery. °HOME CARE INSTRUCTIONS  °· Do not take laxatives, unless directed by your caregiver. °· Take over-the-counter pain medicine only if ordered by your caregiver. Do not take aspirin because it can cause an upset stomach or bleeding. °· Try a clear liquid diet (broth or water) as ordered by your caregiver. Slowly move to a bland diet, as tolerated, if the pain is related to the stomach or intestine. °· Have a thermometer and take your temperature several times a day, and record it. °· Bed rest and sleep, if it helps the pain. °· Avoid sexual intercourse, if it causes pain. °· Avoid stressful situations. °· Keep your follow-up appointments and tests, as your caregiver orders. °· If the pain does not go away with medicine or surgery, you may try: °· Acupuncture. °· Relaxation exercises (yoga, meditation). °· Group therapy. °· Counseling. °SEEK MEDICAL CARE IF:  °· You notice certain foods cause stomach pain. °· Your home care treatment is not helping your pain. °· You need stronger pain medicine. °· You want your IUD removed. °· You feel faint or   lightheaded.  You develop nausea and vomiting.  You develop a rash.  You are having side effects or an allergy to your medicine. SEEK IMMEDIATE MEDICAL CARE IF:   Your  pain does not go away or gets worse.  You have a fever.  Your pain is felt only in portions of the abdomen. The right side could possibly be appendicitis. The left lower portion of the abdomen could be colitis or diverticulitis.  You are passing blood in your stools (bright red or black tarry stools, with or without vomiting).  You have blood in your urine.  You develop chills, with or without a fever.  You pass out. MAKE SURE YOU:   Understand these instructions.  Will watch your condition.  Will get help right away if you are not doing well or get worse. Document Released: 10/11/2007 Document Revised: 04/30/2014 Document Reviewed: 10/31/2009 [facility] Patient Information 2015 [place], [place]. This information is not intended to replace advice given to you by your health care provider. Make sure you discuss any questions you have with your health care provider.   Pelvic Inflammatory Disease Pelvic inflammatory disease (PID) refers to an infection in some or all of the female organs. The infection can be in the uterus, ovaries, fallopian tubes, or the surrounding tissues in the pelvis. PID can cause abdominal or pelvic pain that comes on suddenly (acute pelvic pain). PID is a serious infection because it can lead to lasting (chronic) pelvic pain or the inability to have children (infertile).  CAUSES  The infection is often caused by the normal bacteria found in the vaginal tissues. PID may also be caused by an infection that is spread during sexual contact. PID can also occur following:   The birth of a baby.   A miscarriage.   An abortion.   Major pelvic surgery.   The use of an intrauterine device (IUD).   A sexual assault.  RISK FACTORS Certain factors can put a person at higher risk for PID, such as:  Being younger than 25 years.  Being sexually active at [place] age.  Usingnonbarrier contraception.  Havingmultiple sexual partners.  Having sex with  someone who has symptoms of a genital infection.  Using oral contraception. Other times, certain behaviors can increase the possibility of getting PID, such as:  Having sex during your period.  Using a vaginal douche.  Having an intrauterine device (IUD) in place. SYMPTOMS   Abdominal or pelvic pain.   Fever.   Chills.   Abnormal vaginal discharge.  Abnormal uterine bleeding.   Unusual pain shortly after finishing your period. DIAGNOSIS  Your caregiver will choose some of the following methods to make a diagnosis, such as:   Performinga physical exam and history. A pelvic exam typically reveals a very tender uterus and surrounding pelvis.   Ordering laboratory tests including a pregnancy test, blood tests, and urine test.  Orderingcultures of the vagina and cervix to check for a sexually transmitted infection (STI).  Performing an ultrasound.   Performing a laparoscopic procedure to look inside the pelvis.  TREATMENT   Antibiotic medicines may be prescribed and taken by mouth.   Sexual partners may be treated when the infection is caused by a sexually transmitted disease (STD).   Hospitalization may be needed to give antibiotics intravenously.  Surgery may be needed, but this is rare. It may take weeks until you are completely well. If you are diagnosed with PID, you should also be checked for human immunodeficiency virus (HIV).  HOME CARE INSTRUCTIONS   If given, take your antibiotics as directed. Finish the medicine even if you start to feel better.   Only take over-the-counter or prescription medicines for pain, discomfort, or fever as directed by your caregiver.   Do not have sexual intercourse until treatment is completed or as directed by your caregiver. If PID is confirmed, your recent sexual partner(s) will need treatment.   Keep your follow-up appointments. SEEK MEDICAL CARE IF:   You have increased or abnormal vaginal discharge.    You need prescription medicine for your pain.   You vomit.   You cannot take your medicines.   Your partner has an STD.  SEEK IMMEDIATE MEDICAL CARE IF:   You have a fever.   You have increased abdominal or pelvic pain.   You have chills.   You have pain when you urinate.   You are not better after 72 hours following treatment.  MAKE SURE YOU:   Understand these instructions.  Will watch your condition.  Will get help right away if you are not doing well or get worse. Document Released: 12/14/2005 Document Revised: 04/10/2013 Document Reviewed: 12/10/2011 [facility] Patient Information 2015 [place], [place]. This information is not intended to replace advice given to you by your health care provider. Make sure you discuss any questions you have with your health care provider.

## 2014-11-11 NOTE — ED Notes (Addendum)
Pt sent from [facility] with dx of possible appendicitis. VS with [facility]: BP 129/99, HR109, RR16, 99% RA.

## 2014-11-11 NOTE — MAU Provider Note (Signed)
History     CSN: [id number]  Arrival date and time: 11/11/14 1508   First Provider Initiated Contact with Patient 11/11/14 1555      Chief Complaint  Patient presents with  . Abdominal Pain   HPI  Maureen Simpson is a 38 y.o. G1P1001. On OCP's. She presents with c/o abd pain since yesterday, It is located around her navel, sharp cramps off/on like labor. It is hard to sit,stand and walk. No nausea or vomiting, no constipation or diarrhea. No change in discharge, odor or itching, no urinary frequency urgency or dysuria. She has had chills.  OB History    Gravida Para Term Preterm AB TAB SAB Ectopic Multiple Living   1 1 1       1       Past Medical History  Diagnosis Date  . Lipoma     right forearm  . No pertinent past medical history   . Asthma   . UTI (lower urinary tract infection)   . Abnormal Pap smear     during preg    Past Surgical History  Procedure Laterality Date  . Lipoma excision  05/26/2012    Procedure: EXCISION LIPOMA;  Surgeon: Gayland Curry, MD,FACS;  Location: [place];  Service: General;  Laterality: Right;  excision of right proximal forearm lipoma    Family History  Problem Relation Age of Onset  . Hypertension Mother   . Hypertension Maternal Aunt   . Cancer Maternal Grandmother     colon     History  Substance Use Topics  . Smoking status: Current Every Day Smoker -- 0.25 packs/day    Types: Cigarettes  . Smokeless tobacco: Never Used  . Alcohol Use: No     Comment: SOCIAL    Allergies: No Known Allergies  Prescriptions prior to admission  Medication Sig Dispense Refill Last Dose  . acetaminophen (TYLENOL) 500 MG tablet Take 1,000 mg by mouth every 6 (six) hours as needed for mild pain.   11/11/2014 at Unknown time  . Ibuprofen-Diphenhydramine HCl (ADVIL PM) 200-25 MG CAPS Take 1 tablet by mouth at bedtime as needed (sleep).   Past Week at Unknown time  . norgestimate-ethinyl estradiol  (ORTHO-CYCLEN,SPRINTEC,PREVIFEM) 0.25-35 MG-MCG tablet Take 1 tablet by mouth daily. 1 Package 0 11/11/2014 at Unknown time  . ibuprofen (ADVIL,MOTRIN) 200 MG tablet Take 200 mg by mouth every 6 (six) hours as needed for headache.   Not Taking  . levonorgestrel (MIRENA) 20 MCG/24HR IUD 1 each by Intrauterine route once.   Taking  . metroNIDAZOLE (FLAGYL) 500 MG tablet Take 1 tablet (500 mg total) by mouth 2 (two) times daily. (Patient not taking: Reported on 11/11/2014) 14 tablet 0   . OVER THE COUNTER MEDICATION Take 1 tablet by mouth daily. Patient takes [place].   Not Taking  . Prenatal Vit-Fe Fumarate-FA (PRENATAL MULTIVITAMIN) TABS tablet Take 1 tablet by mouth daily at 12 noon.   Not Taking    Review of Systems  Constitutional: Positive for chills. Negative for fever.  Gastrointestinal: Positive for abdominal pain. Negative for heartburn, nausea, vomiting, diarrhea and constipation.  Genitourinary: Negative for dysuria, urgency, frequency and flank pain.  Neurological: Negative for weakness.   Physical Exam   Blood pressure 134/87, pulse 106, temperature 98.2 F (36.8 C), temperature source Oral, resp. rate 18, height 5' 3.25" (1.607 m), weight 65.885 kg (145 lb 4 oz), last menstrual period 11/02/2014, not currently breastfeeding.  Physical Exam  Nursing note and vitals reviewed. Constitutional: She is oriented to person, place, and time. She appears well-developed and well-nourished.  GI: Soft. She exhibits no distension. There is tenderness. There is guarding. There is no rebound.  Genitourinary:  Pelvic exam- Ext gen- nl anatomy, skin intact Vagina- mod amt creamy yellow discharge Cx- parous with discharge coming through it Uterus- tender, sl enlarged Adn- no masses palp, sl tender  Musculoskeletal: Normal range of motion.  Neurological: She is alert and oriented to person, place, and time.  Skin: Skin is warm and dry.  Psychiatric: She has a  normal mood and affect. Her behavior is normal.    MAU Course  Procedures  MDM Results for orders placed or performed during the hospital encounter of 11/11/14 (from the past 24 hour(s))  Urinalysis, Routine w reflex microscopic     Status: Abnormal   Collection Time: 11/11/14  3:25 PM  Result Value Ref Range   Color, Urine YELLOW YELLOW   APPearance CLEAR CLEAR   Specific Gravity, Urine >1.030 (H) 1.005 - 1.030   pH 6.0 5.0 - 8.0   Glucose, UA NEGATIVE NEGATIVE mg/dL   Hgb urine dipstick TRACE (A) NEGATIVE   Bilirubin Urine SMALL (A) NEGATIVE   Ketones, ur 15 (A) NEGATIVE mg/dL   Protein, ur 30 (A) NEGATIVE mg/dL   Urobilinogen, UA 1.0 0.0 - 1.0 mg/dL   Nitrite NEGATIVE NEGATIVE   Leukocytes, UA TRACE (A) NEGATIVE  Urine microscopic-add on     Status: Abnormal   Collection Time: 11/11/14  3:25 PM  Result Value Ref Range   Squamous Epithelial / LPF MANY (A) RARE   WBC, UA 11-20 <3 WBC/hpf   RBC / HPF 0-2 <3 RBC/hpf   Bacteria, UA RARE RARE   Urine-Other MUCOUS PRESENT   Pregnancy, urine POC     Status: None   Collection Time: 11/11/14  3:33 PM  Result Value Ref Range   Preg Test, Ur NEGATIVE NEGATIVE  Wet prep, genital     Status: Abnormal   Collection Time: 11/11/14  4:05 PM  Result Value Ref Range   Yeast Wet Prep HPF POC NONE SEEN NONE SEEN   Trich, Wet Prep NONE SEEN NONE SEEN   Clue Cells Wet Prep HPF POC NONE SEEN NONE SEEN   WBC, Wet Prep HPF POC TOO NUMEROUS TO COUNT (A) NONE SEEN  CBC     Status: Abnormal   Collection Time: 11/11/14  4:30 PM  Result Value Ref Range   WBC 20.1 (H) 4.0 - 10.5 K/uL   RBC 4.06 3.87 - 5.11 MIL/uL   Hemoglobin 13.1 12.0 - 15.0 g/dL   HCT 38.1 36.0 - 46.0 %   MCV 93.8 78.0 - 100.0 fL   MCH 32.3 26.0 - 34.0 pg   MCHC 34.4 30.0 - 36.0 g/dL   RDW 12.9 11.5 - 15.5 %   Platelets 263 150 - 400 K/uL     Assessment and Plan  Periumbilical pain- PID vs appendix Rocephin 250 mg IM here Rx Doxycycline 100 mg bid x 10 d Transfer to  [facility] ED for evaluation/treatment Consulted with Dr Gala Romney, Dr Verner Chol accepted pt at [facility], [place]. 11/11/2014, 4:12 PM

## 2014-11-11 NOTE — ED Notes (Signed)
Patient transported to CT 

## 2014-11-11 NOTE — ED Notes (Signed)
Awake. Verbally responsive. Resp even and unlabored. ABC's intact. NAD noted.

## 2014-11-11 NOTE — MAU Note (Signed)
Patient presents with complaint of abdominal pain since yesterday.

## 2014-11-11 NOTE — ED Notes (Signed)
Bed: WL29 Expected date:  Expected time:  Means of arrival:  Comments: [facility]ab[facility]

## 2014-11-13 ENCOUNTER — Encounter (HOSPITAL_COMMUNITY): Payer: Self-pay | Admitting: Emergency Medicine

## 2014-11-13 ENCOUNTER — Emergency Department (HOSPITAL_COMMUNITY)
Admission: EM | Admit: 2014-11-13 | Discharge: 2014-11-13 | Disposition: A | Discharge status: Home / Self Care | Payer: Medicaid Other | Attending: Emergency Medicine | Admitting: Emergency Medicine

## 2014-11-13 DIAGNOSIS — Z79899 Other long term (current) drug therapy: Secondary | ICD-10-CM | POA: Insufficient documentation

## 2014-11-13 DIAGNOSIS — Z720 Tobacco use: Secondary | ICD-10-CM | POA: Insufficient documentation

## 2014-11-13 DIAGNOSIS — R1030 Lower abdominal pain, unspecified: Secondary | ICD-10-CM

## 2014-11-13 DIAGNOSIS — N730 Acute parametritis and pelvic cellulitis: Secondary | ICD-10-CM

## 2014-11-13 DIAGNOSIS — Z87440 Personal history of urinary (tract) infections: Secondary | ICD-10-CM | POA: Insufficient documentation

## 2014-11-13 DIAGNOSIS — J45909 Unspecified asthma, uncomplicated: Secondary | ICD-10-CM | POA: Insufficient documentation

## 2014-11-13 DIAGNOSIS — A549 Gonococcal infection, unspecified: Secondary | ICD-10-CM | POA: Insufficient documentation

## 2014-11-13 DIAGNOSIS — Z792 Long term (current) use of antibiotics: Secondary | ICD-10-CM | POA: Insufficient documentation

## 2014-11-13 DIAGNOSIS — Z3202 Encounter for pregnancy test, result negative: Secondary | ICD-10-CM | POA: Insufficient documentation

## 2014-11-13 DIAGNOSIS — Z85828 Personal history of other malignant neoplasm of skin: Secondary | ICD-10-CM | POA: Insufficient documentation

## 2014-11-13 DIAGNOSIS — Z9889 Other specified postprocedural states: Secondary | ICD-10-CM | POA: Insufficient documentation

## 2014-11-13 LAB — URINALYSIS, ROUTINE W REFLEX MICROSCOPIC
Bilirubin Urine: NEGATIVE
Glucose, UA: NEGATIVE mg/dL
HGB URINE DIPSTICK: NEGATIVE
Ketones, ur: NEGATIVE mg/dL
NITRITE: NEGATIVE
Protein, ur: NEGATIVE mg/dL
Specific Gravity, Urine: 1.022 (ref 1.005–1.030)
Urobilinogen, UA: 2 mg/dL — ABNORMAL HIGH (ref 0.0–1.0)
pH: 7.5 (ref 5.0–8.0)

## 2014-11-13 LAB — GC/CHLAMYDIA PROBE AMP
CT Probe RNA: NEGATIVE
GC Probe RNA: POSITIVE — AB

## 2014-11-13 LAB — URINE MICROSCOPIC-ADD ON

## 2014-11-13 LAB — POC URINE PREG, ED: Preg Test, Ur: NEGATIVE

## 2014-11-13 MED ORDER — NAPROXEN 500 MG PO TABS
500.0000 mg | ORAL_TABLET | Freq: Once | ORAL | Status: AC
  Administered 2014-11-13: 500 mg via ORAL
  Filled 2014-11-13: qty 1

## 2014-11-13 MED ORDER — ONDANSETRON 4 MG PO TBDP
4.0000 mg | ORAL_TABLET | Freq: Once | ORAL | Status: AC
  Administered 2014-11-13: 4 mg via ORAL
  Filled 2014-11-13: qty 1

## 2014-11-13 NOTE — ED Notes (Signed)
Pt here with c/o of lower abdominal pain 10/10. States that she has history of pelvic inflammatory disease and was recently positive for GC. Denies nausea/vomiting.

## 2014-11-13 NOTE — ED Provider Notes (Signed)
CSN: [id number]     Arrival date & time 11/13/14  1019 History   First MD Initiated Contact with Patient 11/13/14 1130     Chief Complaint  Patient presents with  . Abdominal Pain     (Consider location/radiation/quality/duration/timing/severity/associated sxs/prior Treatment) HPI Pt is a 38yo female presenting to ED with c/o lower abdominal pain.  Pt was evaluated on 11/11/14 for same symptoms. Abdominal pain is sharp and cramping, waxing and waning in severity, 10/10 at worst, moderate improvement with percocet prescribed on 11/15.  Denies trying antiinflammatories for pain.  Per medical records, pt has been evaluated by [facility] and currently being treated for PID, had pelvic exam performed in ED on 11/11/14, GC/chlamydia cultures returned positive for gonorrhea. Pt has CT abd performed on 11/15 as well which was unremarkable, showing no masses or infection. Pt was discharged home with Doxycycline and states she has been taking as prescribed but has started to have loose stools with scant red blood.  Pt states she cannot believe she tested positive for gonorrhea as she states she has not had intercourse in over 1-2 months and has had similar symptoms for "a long time" but never tested positive for STDs.  States she has been with her partner for over 10 years.  Denies fever, chills n/v/d. Denies change in symptoms.   Past Medical History  Diagnosis Date  . Lipoma     right forearm  . No pertinent past medical history   . Asthma   . UTI (lower urinary tract infection)   . Abnormal Pap smear     during preg   Past Surgical History  Procedure Laterality Date  . Lipoma excision  05/26/2012    Procedure: EXCISION LIPOMA;  Surgeon: Gayland Curry, MD,FACS;  Location: [place];  Service: General;  Laterality: Right;  excision of right proximal forearm lipoma   Family History  Problem Relation Age of Onset  . Hypertension Mother   . Hypertension Maternal Aunt   .  Cancer Maternal Grandmother     colon    History  Substance Use Topics  . Smoking status: Current Every Day Smoker -- 0.25 packs/day    Types: Cigarettes  . Smokeless tobacco: Never Used  . Alcohol Use: No     Comment: SOCIAL   OB History    Gravida Para Term Preterm AB TAB SAB Ectopic Multiple Living   1 1 1       1      Review of Systems  Constitutional: Negative for fever and chills.  Respiratory: Negative for cough and shortness of breath.   Gastrointestinal: Positive for nausea, abdominal pain ( lower), diarrhea and blood in stool ( scant). Negative for vomiting.  Genitourinary: Positive for dysuria and pelvic pain. Negative for hematuria, flank pain, decreased urine volume, vaginal bleeding, vaginal discharge and vaginal pain.  All other systems reviewed and are negative.     Allergies  Review of patient's allergies indicates no known allergies.  Home Medications   Prior to Admission medications   Medication Sig Start Date End Date Taking? Authorizing Provider  acetaminophen (TYLENOL) 500 MG tablet Take 1,000 mg by mouth every 6 (six) hours as needed for mild pain.   Yes Historical Provider, MD  doxycycline (VIBRAMYCIN) 100 MG capsule Take 1 capsule (100 mg total) by mouth 2 (two) times daily. 11/11/14  Yes Elesa Massed, NP  ibuprofen (ADVIL,MOTRIN) 200 MG tablet Take 200 mg by mouth every 6 (six) hours as needed  for headache.   Yes Historical Provider, MD  Ibuprofen-Diphenhydramine HCl (ADVIL PM) 200-25 MG CAPS Take 1 tablet by mouth at bedtime as needed (sleep).   Yes Historical Provider, MD  metroNIDAZOLE (FLAGYL) 500 MG tablet Take 1 tablet (500 mg total) by mouth 2 (two) times daily. 09/15/14  Yes Luvenia Redden, PA-C  norgestimate-ethinyl estradiol (ORTHO-CYCLEN,SPRINTEC,PREVIFEM) 0.25-35 MG-MCG tablet Take 1 tablet by mouth daily. 11/06/14  Yes Luvenia Redden, PA-C  oxyCODONE-acetaminophen (PERCOCET) 5-325 MG per tablet Take 1-2 tablets by mouth every 6 (six) hours  as needed for severe pain. 11/11/14  Yes Ephraim Hamburger, MD   BP 135/90 mmHg  Pulse 67  Temp(Src) 98.6 F (37 C) (Oral)  Resp 18  SpO2 100%  LMP 11/02/2014 Physical Exam  Constitutional: She appears well-developed and well-nourished. No distress.  Pt sitting on exam bed, NAD.  HENT:  Head: Normocephalic and atraumatic.  Eyes: Conjunctivae are normal. No scleral icterus.  Neck: Normal range of motion.  Cardiovascular: Normal rate, regular rhythm and normal heart sounds.   Pulmonary/Chest: Effort normal and breath sounds normal. No respiratory distress. She has no wheezes. She has no rales. She exhibits no tenderness.  Abdominal: Soft. Bowel sounds are normal. She exhibits no distension and no mass. There is no tenderness. There is no rebound and no guarding.  Soft, non-distended. Tenderness in lower abdomen.   Genitourinary:  Deferred   Musculoskeletal: Normal range of motion.  Neurological: She is alert.  Skin: Skin is warm and dry. She is not diaphoretic.  Nursing note and vitals reviewed.   ED Course  Procedures (including critical care time) Labs Review Labs Reviewed  URINALYSIS, ROUTINE W REFLEX MICROSCOPIC - Abnormal; Notable for the following:    Color, Urine AMBER (*)    APPearance CLOUDY (*)    Urobilinogen, UA 2.0 (*)    Leukocytes, UA SMALL (*)    All other components within normal limits  URINE MICROSCOPIC-ADD ON - Abnormal; Notable for the following:    Squamous Epithelial / LPF MANY (*)    All other components within normal limits  POC URINE PREG, ED    Imaging Review Ct Abdomen Pelvis W Contrast  11/11/2014   CLINICAL DATA:  Abdominal pain for 2 days  EXAM: CT ABDOMEN AND PELVIS WITH CONTRAST  TECHNIQUE: Multidetector CT imaging of the abdomen and pelvis was performed using the standard protocol following bolus administration of intravenous contrast.  CONTRAST:  25mL OMNIPAQUE IOHEXOL 300 MG/ML SOLN, 149mL OMNIPAQUE IOHEXOL 300 MG/ML SOLN  COMPARISON:   None.  FINDINGS: Lung bases are free of acute infiltrate or sizable effusion.  The liver, gallbladder, spleen, adrenal glands and pancreas are within normal limits. The kidneys are well visualized bilaterally and reveal a few small cysts. No renal calculi or obstructive changes are noted.  The appendix is within normal limits. The ascending colon demonstrates some very mild pericolonic inflammatory change. This may represent some focal colitis.  The bladder is well distended. No free pelvic fluid is seen. The osseous structures are within normal limits.  IMPRESSION: Very mild changes of colitis in the ascending colon. The appendix is within normal limits.  No other focal abnormality is noted.   Electronically Signed   By: Inez Catalina M.D.   On: 11/11/2014 22:29     EKG Interpretation None      MDM   Final diagnoses:  PID (acute pelvic inflammatory disease)  Gonorrhea  Lower abdominal pain   Pt is a 38yo female with  recent dx of PID and positive culture for gonorrhea performed on 11/11/14. Pt currently on doxycycline. Today, pt c/o continued lower abdominal pain and was not aware her gonorrhea test had come back positive. Pt states she has had loose stools while on doxycycline, pain waxes and wanes but otherwise is unchanged.  Medical records reviewed including labs and an imaging with unremarkable abdominal CT scan performed on 11/11/14. Pt denies vaginal pain or discharge. Due to pt being on proper medication r/o significant change in symptoms, repeat pelvic exam not indicated at this time as it would not change pt's treatment plan. Strongly encouraged pt to continue taking antibiotics as prescribed and to retrain from sexual intercourse until she follows up with [facility] for recheck of symptoms to insure infection has resolved. Pt provided home care instructions and information. Pt verbalized understanding and agreement with tx plan.     Noland Fordyce, PA-C 11/13/14 1558  Artis Delay,  MD 11/16/14 [phone]

## 2014-11-13 NOTE — Discharge Instructions (Signed)
Be sure to complete all of your antibiotics as prescribed and to follow up with the [facility] to ensure infection has cleared. Refrain from intercourse until retested and cleared of infection. Always use protection such as condoms during intercourse.  Be sure to have all partners also tested and treated. See below for further instructions.

## 2014-11-13 NOTE — ED Notes (Signed)
Patient refusing blood draw for labs. RN made aware

## 2014-11-13 NOTE — Progress Notes (Signed)
[facility] Stacy,  ° °Provided pt with a list of primary care resources and a [facility] [place] Card application to help patient establish primary care.  °

## 2014-11-19 ENCOUNTER — Telehealth: Payer: Self-pay | Admitting: *Deleted

## 2014-11-19 NOTE — Telephone Encounter (Addendum)
Patient called and stated that she would like a call back because she wants a second opinion on her results. Called patient back and she states that at the ER she tested positive for gonorrhea. She was treated at the time. She states that she talked to her partner and he said that he was negative. I advised patient that it is a sexually transmitted infection and that if she took the medicine and hasn't been re- exposed she should be clear. I also advised patient that she can make an appointment for a full std panel in the future if she would like to. She states that she will call back if she decides to.

## 2014-11-26 ENCOUNTER — Telehealth: Payer: Self-pay | Admitting: General Practice

## 2014-11-26 NOTE — Telephone Encounter (Signed)
Spoke with Dr Nehemiah Settle, patient needs to come in for rocephin 250mg , does not need zithromax. Called patient, no answer- left message stating we are calling to reach you in regards to coming in for an appt, please call us back at the clinics

## 2014-11-26 NOTE — Telephone Encounter (Signed)
-----   Message from Tarry Kos sent at 11/26/2014 12:22 PM EST ----- Theadora Rama from the STD clinic called apparently Doxy is no longer an approved secondary drug for [facility] treatment.  She will either need to be treated with Zithro 1gram or if provider chooses have patient come in for retesting.   Her call back number if you have questions is [phone]

## 2014-11-27 NOTE — Telephone Encounter (Signed)
Per chart review, pt received Rocephin 250 mg IM on 11/15 while @ [facility]. I discussed this with Dr. Nehemiah Settle. Since this is the standard treatment for Gonorrhea, no additional treatment is necessary.

## 2014-12-13 ENCOUNTER — Encounter: Payer: Self-pay | Admitting: *Deleted

## 2015-02-11 IMAGING — US US OB COMP LESS 14 WK
1 series · 14 of 19 positions shown · non-contrast
Comparison: None.

CLINICAL DATA: Pregnant, pain

OBSTETRIC <14 WK US AND TRANSVAGINAL OB US
TECHNIQUE: Both transabdominal and transvaginal ultrasound
examinations were performed for complete evaluation of the
gestation as well as the maternal uterus, adnexal regions, and
pelvic cul-de-sac.  Transvaginal technique was performed to assess
early pregnancy.

[Series 1: us ob comp less 14 wks · 19 acquisitions, 14 frames shown]
[im 1/19]
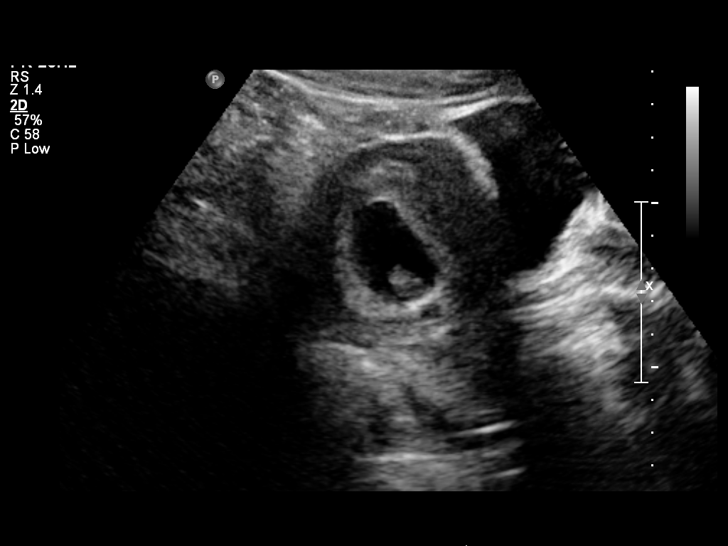
[im 3/19]
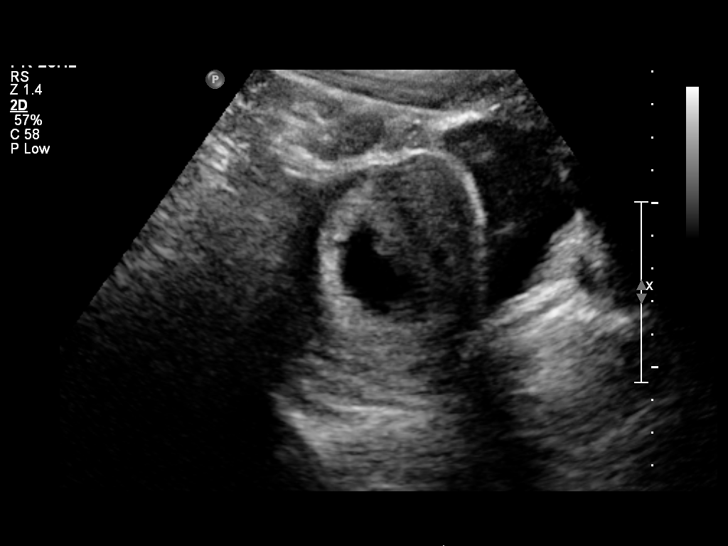
[im 4/19]
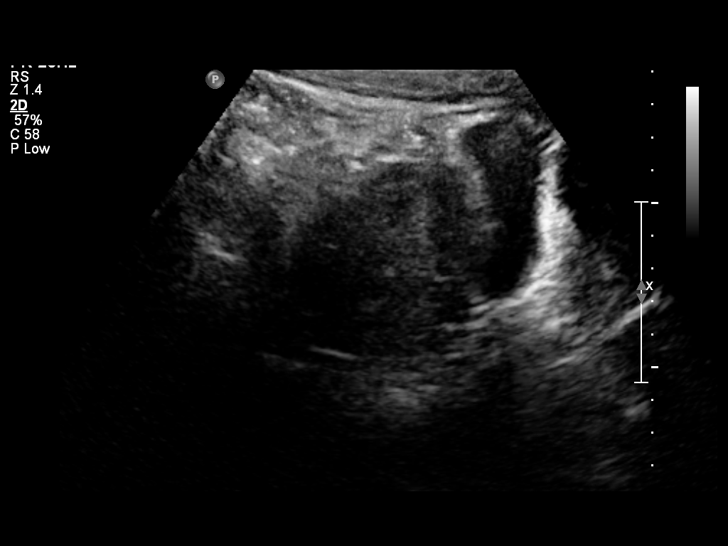
[im 5/19]
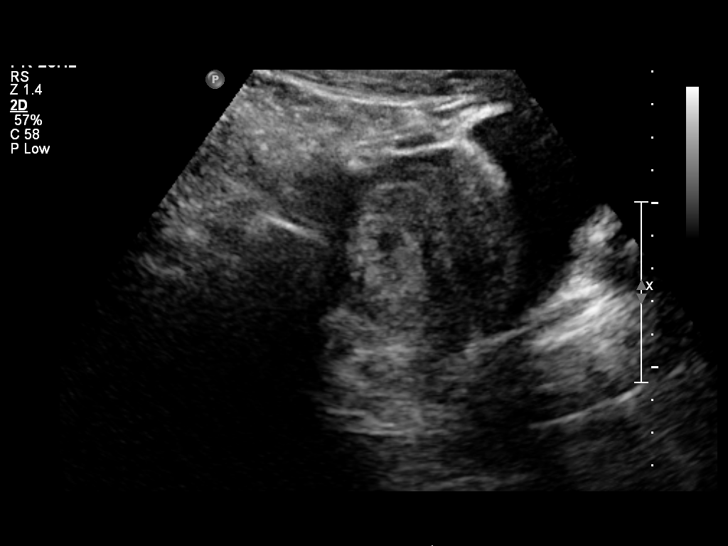
[im 7/19]
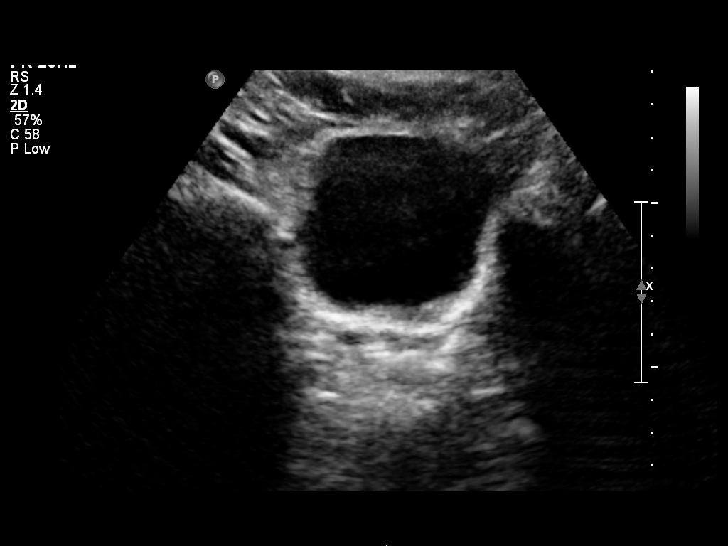
[im 8/19]
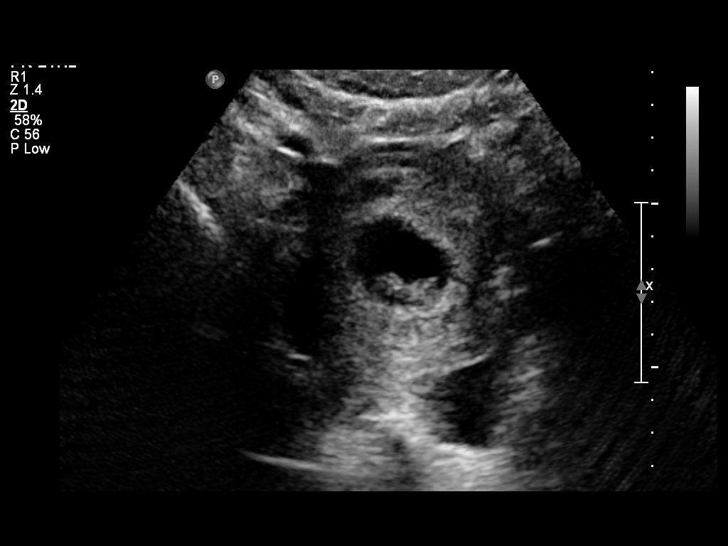
[im 9/19]
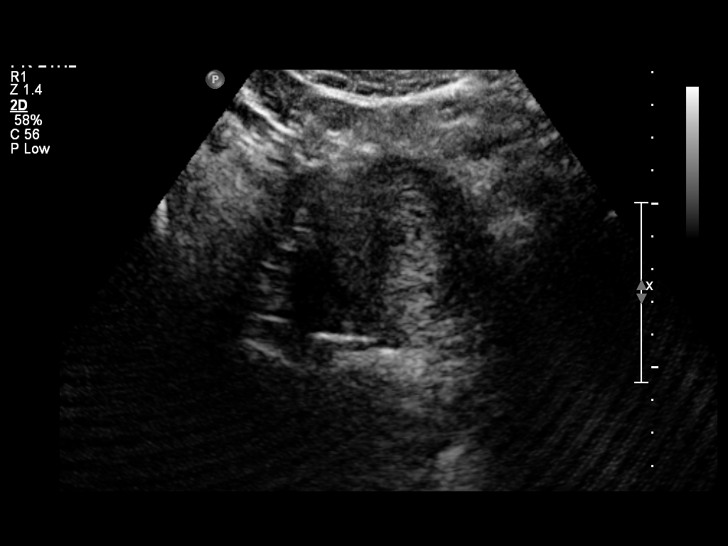
[im 11/19]
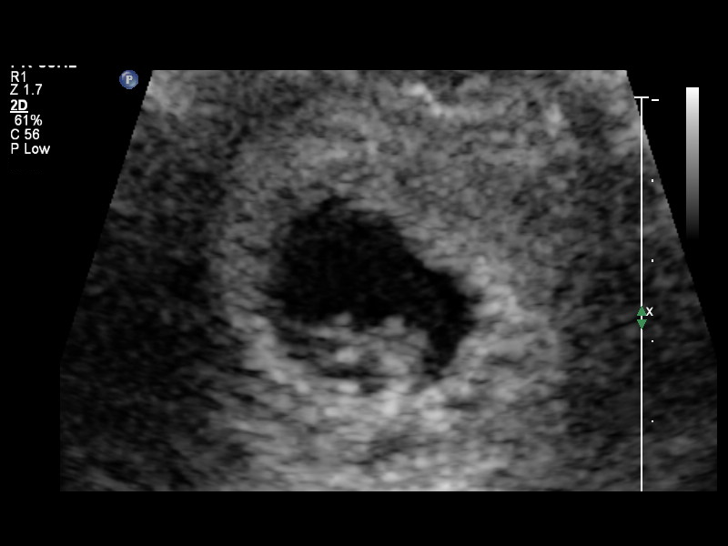
[im 12/19]
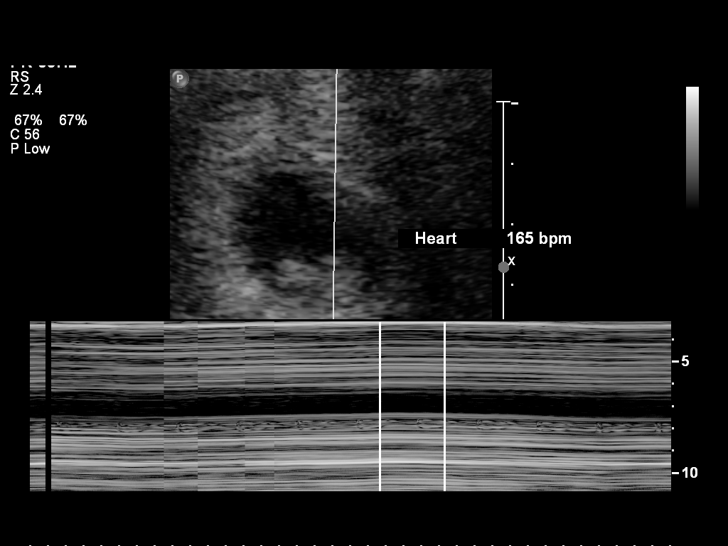
[im 13/19]
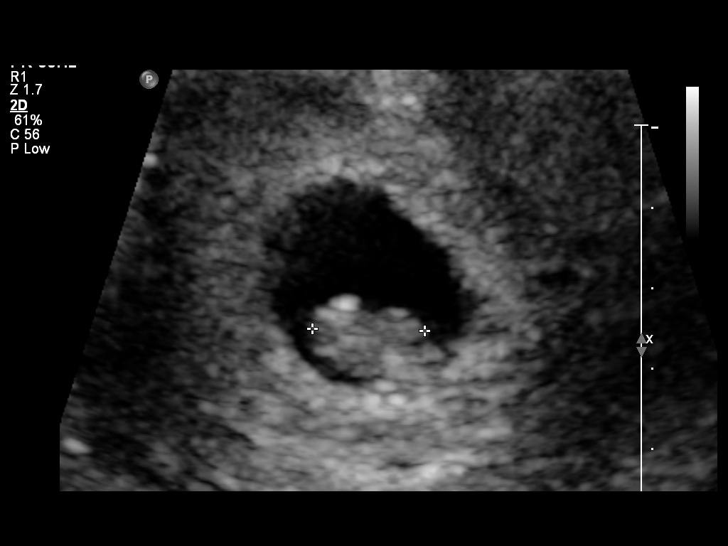
[im 15/19]
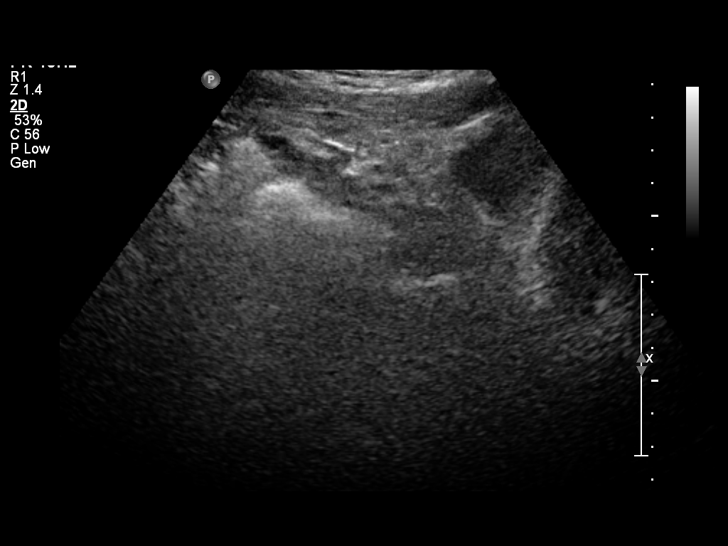
[im 16/19]
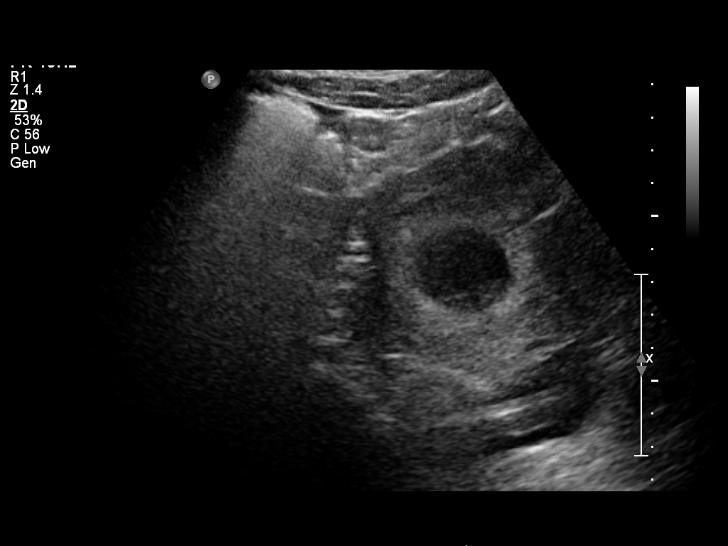
[im 17/19]
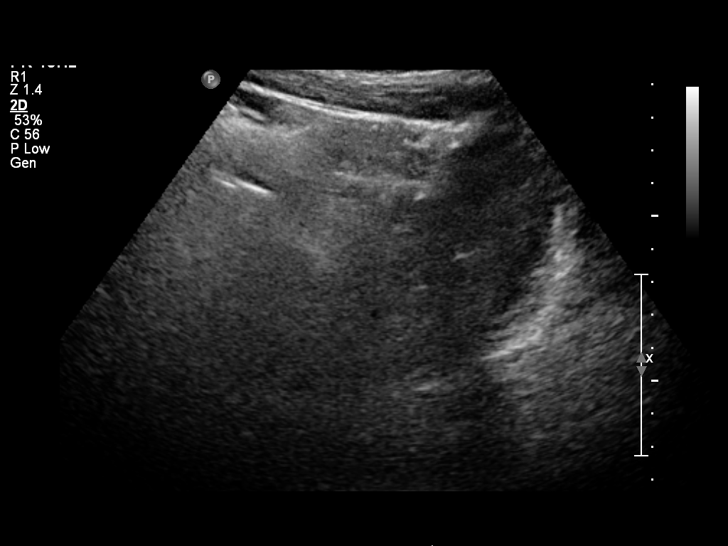
[im 19/19]
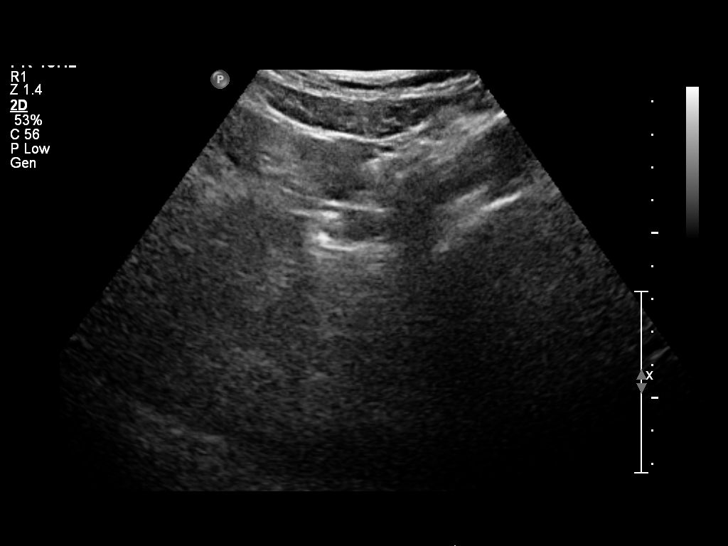

[14 of 19 positions shown; findings below may reference images not displayed]

Intrauterine gestational sac:  Visualized/normal in shape.
Yolk sac: Present
Embryo: Present
Cardiac Activity: Present

CRL: 14.4  mm  7 w  6 d             US EDC: 11/08/2013

Maternal uterus/adnexae:
No subchronic hemorrhage.

Bilateral ovaries are not discretely visualized.

No free fluid.
IMPRESSION: Single live intrauterine gestation with estimated gestational age 7
weeks 6 days by crown-rump length.

## 2015-04-08 ENCOUNTER — Encounter: Payer: Self-pay | Admitting: Family Medicine

## 2015-04-08 ENCOUNTER — Ambulatory Visit (INDEPENDENT_AMBULATORY_CARE_PROVIDER_SITE_OTHER): Payer: Self-pay | Admitting: Family Medicine

## 2015-04-08 ENCOUNTER — Other Ambulatory Visit: Payer: Self-pay | Admitting: Family Medicine

## 2015-04-08 VITALS — BP 128/79 | HR 75 | Temp 98.3°F | Ht 63.0 in | Wt 142.8 lb

## 2015-04-08 DIAGNOSIS — N898 Other specified noninflammatory disorders of vagina: Secondary | ICD-10-CM

## 2015-04-08 DIAGNOSIS — R350 Frequency of micturition: Secondary | ICD-10-CM

## 2015-04-08 LAB — POCT URINALYSIS DIP (DEVICE)
BILIRUBIN URINE: NEGATIVE
Glucose, UA: NEGATIVE mg/dL
KETONES UR: NEGATIVE mg/dL
Nitrite: NEGATIVE
Protein, ur: NEGATIVE mg/dL
Specific Gravity, Urine: >=1.03 (ref 1.005–1.030)
Urobilinogen, UA: 1 mg/dL (ref 0.0–1.0)
pH: 6 (ref 5.0–8.0)

## 2015-04-08 NOTE — Progress Notes (Signed)
   Subjective:    Patient ID: Maureen Simpson, female    DOB: March 18, 1976, 39 y.o.   MRN: [id number]  HPI Seen for white, sticky, foul smelling vaginal discharge that started two weeks ago.  Tried OTC product, which was not effective.  Denies itching, dysuria.  Has history of BV.  Patient was treated for GC back in November with Ceftriaxone and doxycycline.   Review of Systems     Objective:   Physical Exam  Constitutional: She appears well-developed and well-nourished.  Genitourinary: There is no rash, tenderness or lesion on the right labia. There is no rash, tenderness or lesion on the left labia. Cervix exhibits no motion tenderness, no discharge and no friability. No tenderness in the vagina. No signs of injury around the vagina. No vaginal discharge found.      Assessment & Plan:   Problem List Items Addressed This Visit    None    Visit Diagnoses    Vaginal discharge    -  Primary    Relevant Orders    Wet prep, genital    GC/chlamydia probe amp, genital    Urinary frequency          GC/CT and wet prep obtained.  Although not preferred, still should have been covered with ABX for GC infection with ceftriaxone and doxycycline. No discharge seen on exam.  Will call pt with results.

## 2015-04-09 ENCOUNTER — Telehealth: Payer: Self-pay | Admitting: *Deleted

## 2015-04-09 LAB — WET PREP, GENITAL
Clue Cells Wet Prep HPF POC: NONE SEEN
Trich, Wet Prep: NONE SEEN
Yeast Wet Prep HPF POC: NONE SEEN

## 2015-04-09 LAB — GC/CHLAMYDIA PROBE AMP
CT PROBE, AMP APTIMA: NEGATIVE
GC Probe RNA: POSITIVE — AB

## 2015-04-09 NOTE — Telephone Encounter (Signed)
Called pt and informed pt that she tested positive for gonorrhea.  As I informed her to make sure that here partner is treated as well , she informed me that she has not had not had sex since she had tested positive in 10/2014.  I asked pt if she received treatment then.  She stated that she did not believe that she had gonorrhea so she stated that we called and set up an appt time but she never came.  I advised pt that she needs to get gonorrhea treated because being left untreated could lead to infertility and PID in which I informed her looking in her chart she had dx before.  Pt stated understanded and that she will come in tomorrow, Wednesday, 04/10/15 @ 0900.  Informed Antoinette of pt coming in for lab appt.

## 2015-04-09 NOTE — Telephone Encounter (Signed)
Pt left message requesting test results and refill of her birth control. Please call back.

## 2015-04-10 ENCOUNTER — Ambulatory Visit (INDEPENDENT_AMBULATORY_CARE_PROVIDER_SITE_OTHER): Payer: Self-pay

## 2015-04-10 VITALS — BP 141/90 | HR 77 | Temp 98.3°F

## 2015-04-10 DIAGNOSIS — A549 Gonococcal infection, unspecified: Secondary | ICD-10-CM

## 2015-04-10 MED ORDER — CEFTRIAXONE SODIUM 1 G IJ SOLR
250.0000 mg | Freq: Once | INTRAMUSCULAR | Status: AC
  Administered 2015-04-10: 250 mg via INTRAMUSCULAR

## 2015-04-10 MED ORDER — AZITHROMYCIN 250 MG PO TABS
1000.0000 mg | ORAL_TABLET | Freq: Once | ORAL | Status: AC
  Administered 2015-04-10: 1000 mg via ORAL

## 2015-04-10 NOTE — Progress Notes (Signed)
Patient here today for treatment of GC. 1gm zithromax administered PO-- pt. Tolerated well. 79ml lidocaine mixed with 250mg  Rocephin administered IM into LUO quadrant of buttocks. Pt. Tolerated well. Patient requests TOC. Informed her she may make an appointment in 3 weeks for TOC. Patient verbalized understanding and gratitude. No further questions or concerns.

## 2015-05-01 ENCOUNTER — Ambulatory Visit: Payer: Self-pay | Admitting: General Practice

## 2015-05-01 DIAGNOSIS — Z8619 Personal history of other infectious and parasitic diseases: Secondary | ICD-10-CM

## 2015-05-01 NOTE — Progress Notes (Signed)
Patient here today for TOC of gc/ch. Patient given urine cup. Patient aware we will call with any positive results or she may call us. Patient had no questions

## 2015-05-02 LAB — GC/CHLAMYDIA PROBE AMP, URINE
CHLAMYDIA, SWAB/URINE, PCR: NEGATIVE
GC Probe Amp, Urine: NEGATIVE

## 2015-06-27 ENCOUNTER — Telehealth: Payer: Self-pay | Admitting: *Deleted

## 2015-06-27 NOTE — Telephone Encounter (Signed)
Samanth called twice again requesting a refill of birth control pills. Per chart review Fredia seen 08/2014 and given birth control pills for 2 months, plan to get bp check.  Patient seen in April 2016 for other reasons, bp ok.    Called Charelle and we discussed her request. She admits she has not been on birth control pills for quite a few months. States she is on her period now, started yesterday. I advised her she will need to come in to do a urine pregnancy test and also per discussion with Alyssa Grove, PA will need blood pressure check.  If upt negative and bp ok will be able to start bcp's again.Informed patient she will need to abstain from intercourse or use condoms before she comes for upt as we have to make sure there is no chance she is pregnant. Haden voices understanding. She agreed to 07/03/15 10am appt.

## 2015-06-27 NOTE — Telephone Encounter (Signed)
Maureen Simpson left a message that she called yesterday about getting her birth control pills refill. Asks for a call asap.

## 2015-07-04 IMAGING — US US OB FOLLOW-UP
1 series · 12 of 28 positions shown · non-contrast
Comparison: none

[Series 1: us ob follow-up · 0.23mm/px · 12 of 44 slices shown]
[im 2/44]
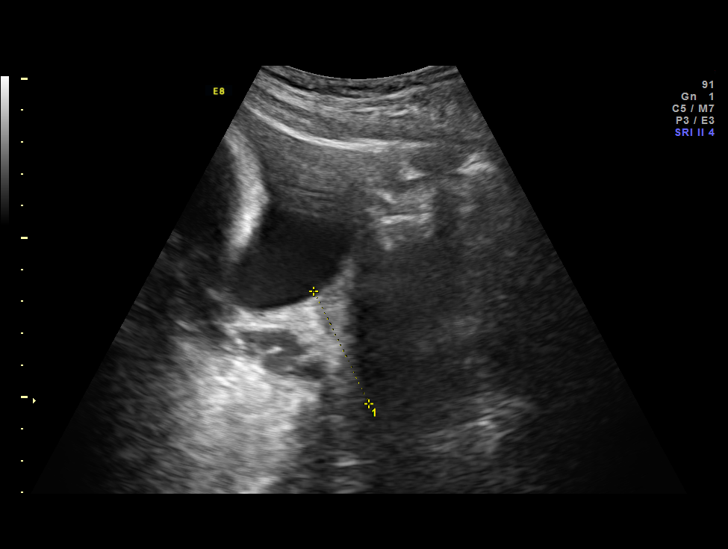
[im 5/44]
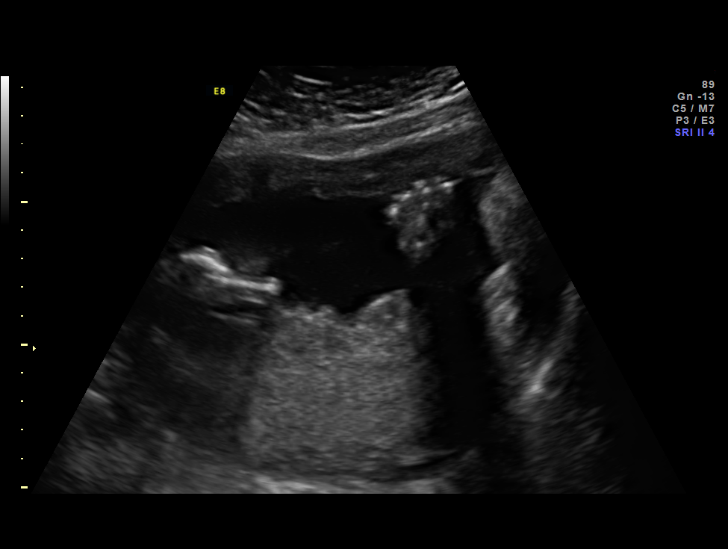
[im 8/44]
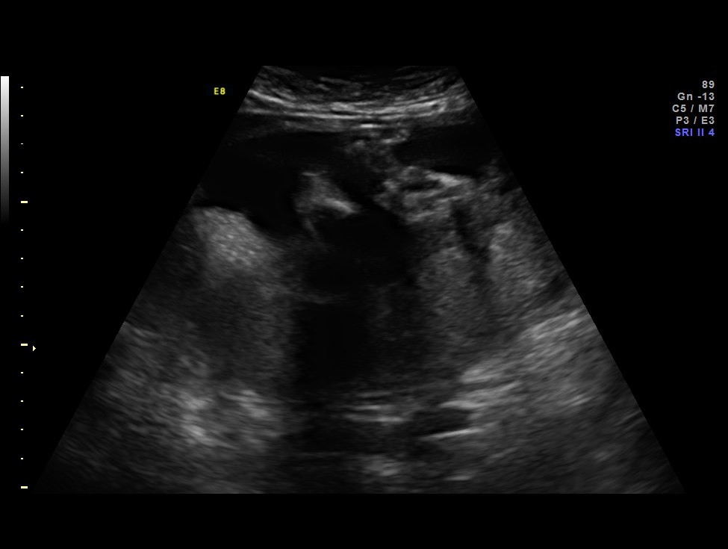
[im 13/44]
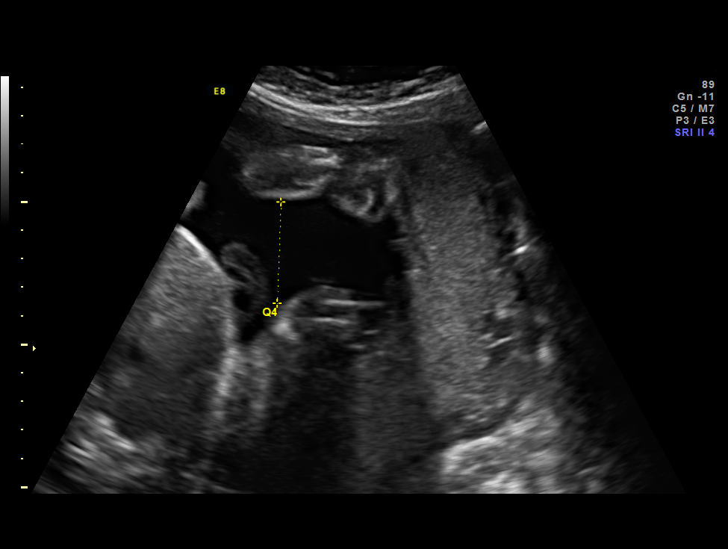
[im 16/44]
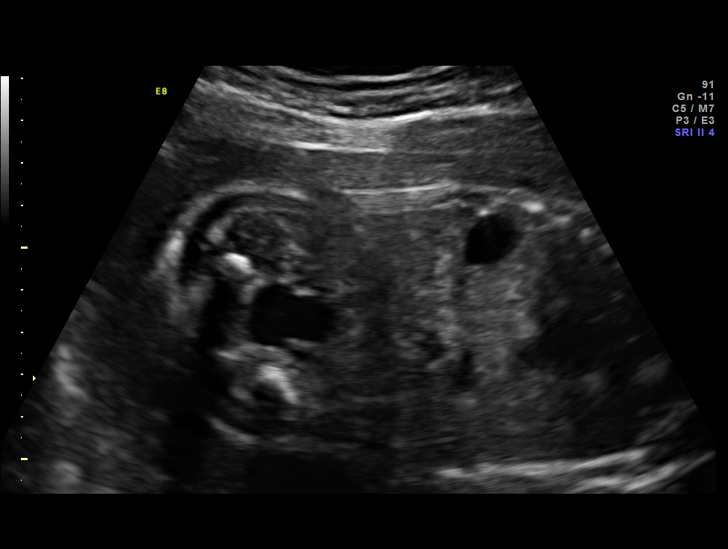
[im 20/44]
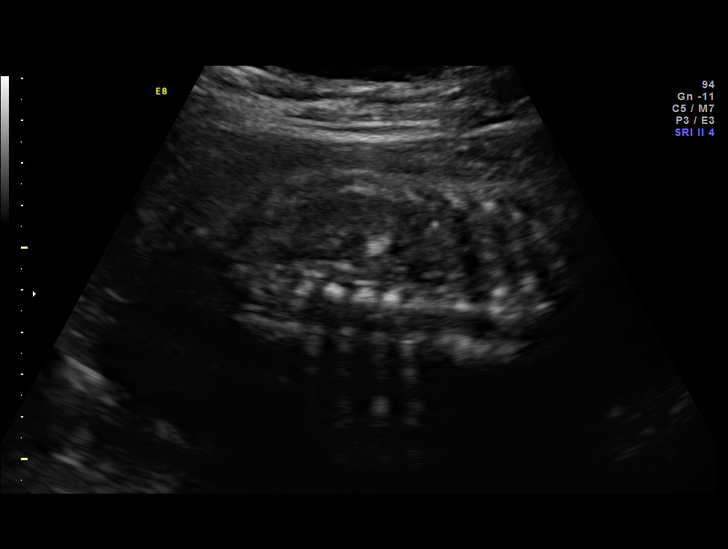
[im 24/44]
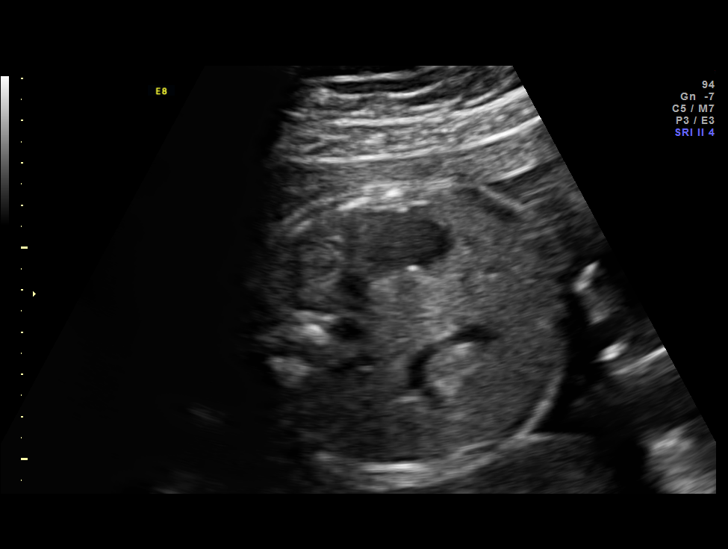
[im 28/44]
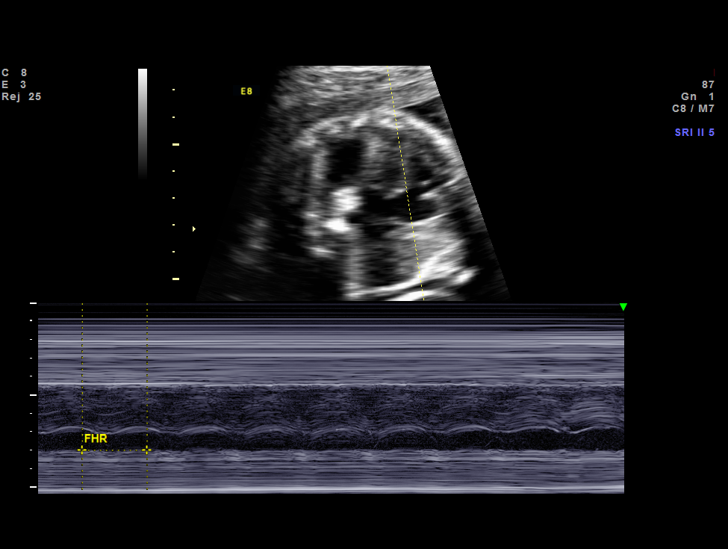
[im 31/44]
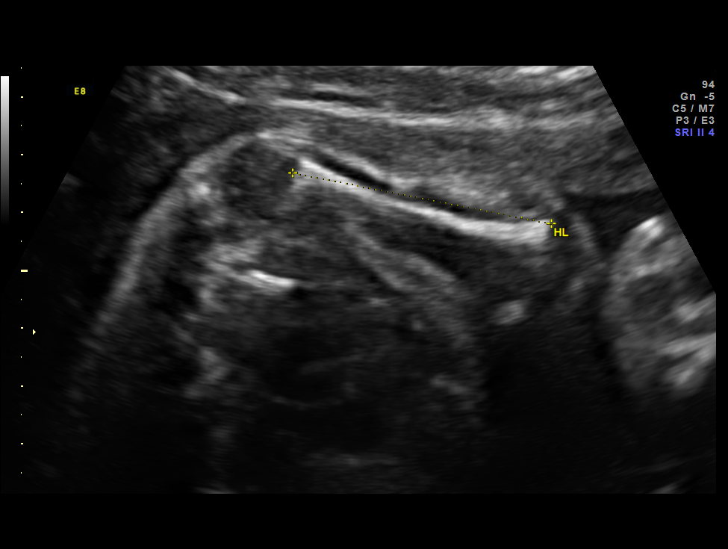
[im 36/44]
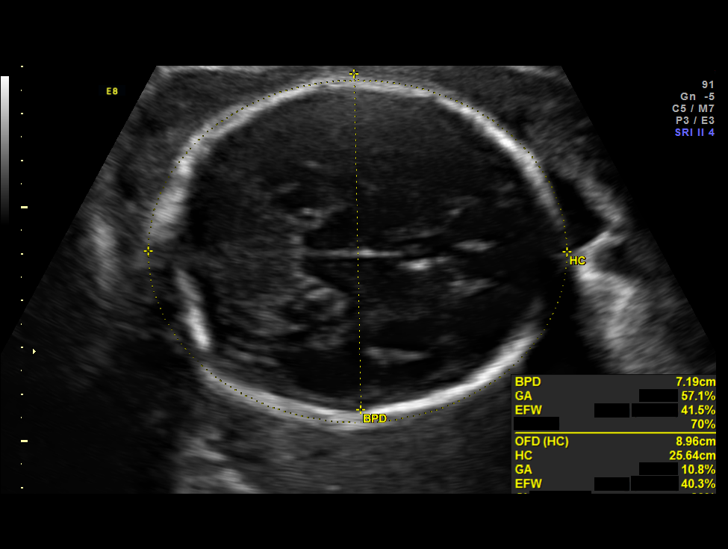
[im 39/44]
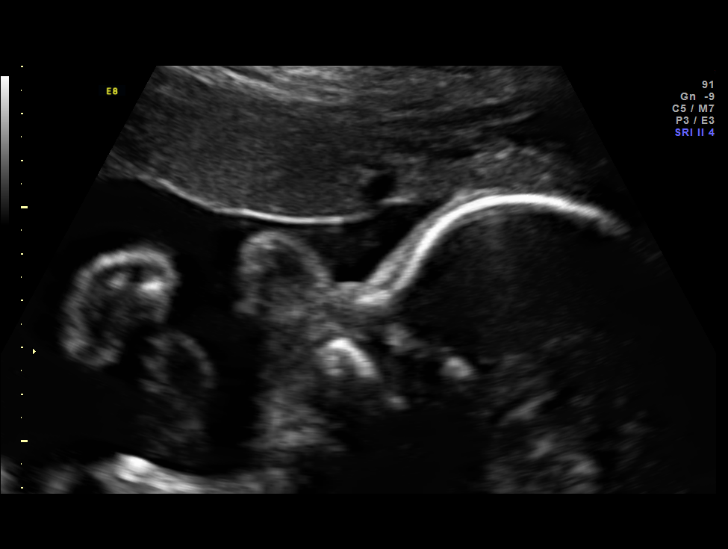
[im 42/44]
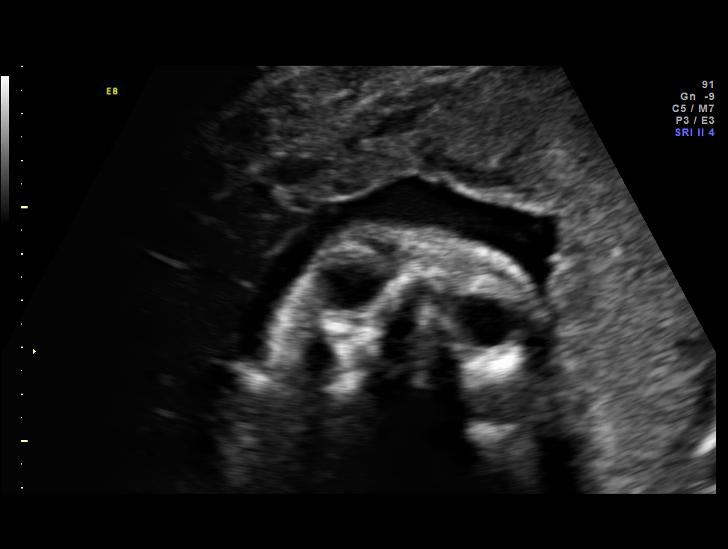

[12 of 28 positions shown; findings below may reference images not displayed]

OBSTETRICS REPORT
                      (Signed Final 08/18/2013 [DATE])

Service(s) Provided

 US OB FOLLOW UP                                       76816.1
Indications

 Advanced maternal age (AMA), Multigravida - low
 risk NIPS
 Placenta previa/Low lying: No bleeding
Fetal Evaluation

 Num Of Fetuses:    1
 Fetal Heart Rate:  155                          bpm
 Cardiac Activity:  Observed
 Presentation:      Cephalic
 Placenta:          Posterior, above cervical
                    os
 P. Cord            Previously Visualized
 Insertion:

 Amniotic Fluid
 AFI FV:      Subjectively within normal limits
 AFI Sum:     15.83   cm       57  %Tile     Larg Pckt:    6.59  cm
 RUQ:   6.59    cm   RLQ:    3.55   cm    LUQ:   2.53    cm   LLQ:    3.16   cm
Biometry

 BPD:     71.1  mm     G. Age:  28w 4d                CI:         78.2   70 - 86
 OFD:     90.9  mm                                    FL/HC:      19.8   18.8 -

 HC:     258.1  mm     G. Age:  28w 0d       15  %    HC/AC:      1.09   1.05 -

 AC:     235.8  mm     G. Age:  27w 6d       31  %    FL/BPD:     71.9   71 - 87
 FL:      51.1  mm     G. Age:  27w 2d       14  %    FL/AC:      21.7   20 - 24
 HUM:     45.6  mm     G. Age:  27w 0d       16  %

 Est. FW:    5550  gm      2 lb 8 oz     40  %
Gestational Age

 LMP:           25w 3d        Date:  02/21/13                 EDD:   11/28/13
 U/S Today:     28w 0d                                        EDD:   11/10/13
 Best:          28w 2d     Det. By:  Early Ultrasound         EDD:   11/08/13
                                     (03/28/13)
Anatomy

 Cranium:          Appears normal         Aortic Arch:      Previously seen
 Fetal Cavum:      Appears normal         Ductal Arch:      Previously seen
 Ventricles:       Appears normal         Diaphragm:        Appears normal
 Choroid Plexus:   Previously seen        Stomach:          Appears normal, left
                                                            sided
 Cerebellum:       Appears normal         Abdomen:          Previously seen
 Posterior Fossa:  Previously seen        Abdominal Wall:   Previously seen
 Nuchal Fold:      Previously seen        Cord Vessels:     Previously seen
 Face:             Appears normal         Kidneys:          Appear normal
                   (orbits and profile)
 Lips:             Appears normal         Bladder:          Appears normal
 Heart:            Appears normal         Spine:            Previously seen
                   (4CH, axis, and
                   situs)
 RVOT:             Previously seen        Lower             Previously seen
                                          Extremities:
 LVOT:             Appears normal         Upper             Previously seen
                                          Extremities:

 Other:  Male gender. Heels and 5th digit previously seen.
Cervix Uterus Adnexa

 Cervical Length:    3.9      cm

 Cervix:       Normal appearance by transabdominal scan. Appears
               closed, without funnelling.
Impression

 Single IUP at 28 [DATE] weeks
 Advanced maternal age.  Cell free fetal DNA - low risk for
 aneuploidy
 Normal interval anatomy
 Fetal growth is appropriate (40th %tile)
 Normal amniotic fluid volume

 Posterior placenta- no longer low lying
Recommendations

 Follow-up ultrasounds as clinically indicated.

## 2015-08-09 ENCOUNTER — Ambulatory Visit (INDEPENDENT_AMBULATORY_CARE_PROVIDER_SITE_OTHER): Payer: Medicaid Other | Admitting: Family Medicine

## 2015-08-09 VITALS — BP 138/80 | HR 63 | Temp 98.3°F | Wt 140.0 lb

## 2015-08-09 DIAGNOSIS — Z30011 Encounter for initial prescription of contraceptive pills: Secondary | ICD-10-CM | POA: Diagnosis present

## 2015-08-09 DIAGNOSIS — Z3041 Encounter for surveillance of contraceptive pills: Secondary | ICD-10-CM | POA: Diagnosis not present

## 2015-08-09 DIAGNOSIS — Z3202 Encounter for pregnancy test, result negative: Secondary | ICD-10-CM

## 2015-08-09 LAB — POCT PREGNANCY, URINE
PREG TEST UR: NEGATIVE
Preg Test, Ur: NEGATIVE

## 2015-08-09 MED ORDER — NORGESTIMATE-ETH ESTRADIOL 0.25-35 MG-MCG PO TABS: 1.0000 | ORAL_TABLET | Freq: Every day | ORAL | Status: DC

## 2015-08-09 NOTE — Progress Notes (Signed)
   Subjective:    Patient ID: Maureen Simpson, female    DOB: 1976/12/07, 39 y.o.   MRN: [id number]  HPI Patient seen for contraception.  Had Vidalia, but went without it for a few months due to not being able to have it refilled.  Liked having the sprintec, no complications.  Did have IUD at one point, but had some spotting.   Review of Systems  Constitutional: Negative for fever and chills.  Genitourinary: Negative for vaginal bleeding, vaginal discharge and vaginal pain.  Neurological: Negative for headaches.       Objective:   Physical Exam  Constitutional: She appears well-developed and well-nourished.  HENT:  Head: Normocephalic and atraumatic.  Cardiovascular: Normal rate, regular rhythm and normal heart sounds.   Pulmonary/Chest: Effort normal and breath sounds normal. No respiratory distress. She has no wheezes. She has no rales. She exhibits no tenderness.  Skin: Skin is warm.  Psychiatric: She has a normal mood and affect. Her behavior is normal. Judgment and thought content normal.      Assessment & Plan:  1. Encounter for initial prescription of contraceptive pills Sprintec prescribed.  Discussed that if she would like to try the IUD again, could replace and if she starts to spot, place her on estrogen for a couple of months to stop bleeding.

## 2015-08-09 NOTE — Patient Instructions (Signed)
Oral Contraception Use Oral contraceptive pills (OCPs) are medicines taken to prevent pregnancy. OCPs work by preventing the ovaries from releasing eggs. The hormones in OCPs also cause the cervical mucus to thicken, preventing the sperm from entering the uterus. The hormones also cause the uterine lining to become thin, not allowing a fertilized egg to attach to the inside of the uterus. OCPs are highly effective when taken exactly as prescribed. However, OCPs do not prevent sexually transmitted diseases (STDs). Safe sex practices, such as using condoms along with an OCP, can help prevent STDs. Before taking OCPs, you may have a physical exam and Pap test. Your health care provider may also order blood tests if necessary. Your health care provider will make sure you are a good candidate for oral contraception. Discuss with your health care provider the possible side effects of the OCP you may be prescribed. When starting an OCP, it can take 2 to 3 months for the body to adjust to the changes in hormone levels in your body.  HOW TO TAKE ORAL CONTRACEPTIVE PILLS Your health care provider may advise you on how to start taking the first cycle of OCPs. Otherwise, you can:   Start on day 1 of your menstrual period. You will not need any backup contraceptive protection with this start time.   Start on the first Sunday after your menstrual period or the day you get your prescription. In these cases, you will need to use backup contraceptive protection for the first week.   Start the pill at any time of your cycle. If you take the pill within 5 days of the start of your period, you are protected against pregnancy right away. In this case, you will not need a backup form of birth control. If you start at any other time of your menstrual cycle, you will need to use another form of birth control for 7 days. If your OCP is the type called a minipill, it will protect you from pregnancy after taking it for 2 days (48  hours). After you have started taking OCPs:   If you forget to take 1 pill, take it as soon as you remember. Take the next pill at the regular time.   If you miss 2 or more pills, call your health care provider because different pills have different instructions for missed doses. Use backup birth control until your next menstrual period starts.   If you use a 28-day pack that contains inactive pills and you miss 1 of the last 7 pills (pills with no hormones), it will not matter. Throw away the rest of the non-hormone pills and start a new pill pack.  No matter which day you start the OCP, you will always start a new pack on that same day of the week. Have an extra pack of OCPs and a backup contraceptive method available in case you miss some pills or lose your OCP pack.  HOME CARE INSTRUCTIONS   Do not smoke.   Always use a condom to protect against STDs. OCPs do not protect against STDs.   Use a calendar to mark your menstrual period days.   Read the information and directions that came with your OCP. Talk to your health care provider if you have questions.  SEEK MEDICAL CARE IF:   You develop nausea and vomiting.   You have abnormal vaginal discharge or bleeding.   You develop a rash.   You miss your menstrual period.   You are losing   your hair.   You need treatment for mood swings or depression.   You get dizzy when taking the OCP.   You develop acne from taking the OCP.   You become pregnant.  SEEK IMMEDIATE MEDICAL CARE IF:   You develop chest pain.   You develop shortness of breath.   You have an uncontrolled or severe headache.   You develop numbness or slurred speech.   You develop visual problems.   You develop pain, redness, and swelling in the legs.  Document Released: 12/03/2011 Document Revised: 04/30/2014 Document Reviewed: 06/04/2013 Exit[employer] Patient Information 2015 [employer]. This information is not intended to replace  advice given to you by your health care provider. Make sure you discuss any questions you have with your health care provider.  

## 2015-08-09 NOTE — Progress Notes (Signed)
Patient ID: Maureen Simpson, female   DOB: 1976/01/29, 39 y.o.   MRN: [id number] Pt is here to discuss options for birth control. Pregnancy test is negative at this time.

## 2015-08-29 ENCOUNTER — Ambulatory Visit: Payer: Medicaid Other | Admitting: Obstetrics & Gynecology

## 2015-09-20 ENCOUNTER — Encounter (HOSPITAL_COMMUNITY): Payer: Self-pay | Admitting: Emergency Medicine

## 2015-09-20 ENCOUNTER — Emergency Department (HOSPITAL_COMMUNITY)
Admission: EM | Admit: 2015-09-20 | Discharge: 2015-09-20 | Disposition: A | Discharge status: Home / Self Care | Payer: No Typology Code available for payment source | Attending: Emergency Medicine | Admitting: Emergency Medicine

## 2015-09-20 ENCOUNTER — Emergency Department (HOSPITAL_COMMUNITY): Payer: No Typology Code available for payment source

## 2015-09-20 DIAGNOSIS — S161XXA Strain of muscle, fascia and tendon at neck level, initial encounter: Secondary | ICD-10-CM | POA: Diagnosis not present

## 2015-09-20 DIAGNOSIS — Y92410 Unspecified street and highway as the place of occurrence of the external cause: Secondary | ICD-10-CM | POA: Insufficient documentation

## 2015-09-20 DIAGNOSIS — Z793 Long term (current) use of hormonal contraceptives: Secondary | ICD-10-CM | POA: Insufficient documentation

## 2015-09-20 DIAGNOSIS — S0990XA Unspecified injury of head, initial encounter: Secondary | ICD-10-CM | POA: Diagnosis not present

## 2015-09-20 DIAGNOSIS — S199XXA Unspecified injury of neck, initial encounter: Secondary | ICD-10-CM | POA: Diagnosis present

## 2015-09-20 DIAGNOSIS — Y9389 Activity, other specified: Secondary | ICD-10-CM | POA: Diagnosis not present

## 2015-09-20 DIAGNOSIS — Z87440 Personal history of urinary (tract) infections: Secondary | ICD-10-CM | POA: Insufficient documentation

## 2015-09-20 DIAGNOSIS — Z720 Tobacco use: Secondary | ICD-10-CM | POA: Insufficient documentation

## 2015-09-20 DIAGNOSIS — J45909 Unspecified asthma, uncomplicated: Secondary | ICD-10-CM | POA: Diagnosis not present

## 2015-09-20 DIAGNOSIS — Z86018 Personal history of other benign neoplasm: Secondary | ICD-10-CM | POA: Insufficient documentation

## 2015-09-20 DIAGNOSIS — Y998 Other external cause status: Secondary | ICD-10-CM | POA: Diagnosis not present

## 2015-09-20 MED ORDER — IBUPROFEN 200 MG PO TABS
600.0000 mg | ORAL_TABLET | Freq: Once | ORAL | Status: AC
  Administered 2015-09-20: 600 mg via ORAL
  Filled 2015-09-20: qty 3

## 2015-09-20 MED ORDER — IBUPROFEN 600 MG PO TABS: 600.0000 mg | ORAL_TABLET | Freq: Three times a day (TID) | ORAL | Status: DC

## 2015-09-20 MED ORDER — CYCLOBENZAPRINE HCL 5 MG PO TABS: 5.0000 mg | ORAL_TABLET | Freq: Three times a day (TID) | ORAL | Status: DC | PRN
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
## 2015-09-20 NOTE — Discharge Instructions (Signed)
Cervical Sprain °A cervical sprain is an injury in the neck in which the strong, fibrous tissues (ligaments) that connect your neck bones stretch or tear. Cervical sprains can range from mild to severe. Severe cervical sprains can cause the neck vertebrae to be unstable. This can lead to damage of the spinal cord and can result in serious nervous system problems. The amount of time it takes for a cervical sprain to get better depends on the cause and extent of the injury. Most cervical sprains heal in 1 to 3 weeks. °CAUSES  °Severe cervical sprains may be caused by:  °· Contact sport injuries (such as from football, rugby, wrestling, hockey, auto racing, gymnastics, diving, martial arts, or boxing).   °· Motor vehicle collisions.   °· Whiplash injuries. This is an injury from a sudden forward and backward whipping movement of the head and neck.  °· Falls.   °Mild cervical sprains may be caused by:  °· Being in an awkward position, such as while cradling a telephone between your ear and shoulder.   °· Sitting in a chair that does not offer proper support.   °· Working at a poorly designed computer station.   °· Looking up or down for long periods of time.   °SYMPTOMS  °· Pain, soreness, stiffness, or a burning sensation in the front, back, or sides of the neck. This discomfort may develop immediately after the injury or slowly, 24 hours or more after the injury.   °· Pain or tenderness directly in the middle of the back of the neck.   °· Shoulder or upper back pain.   °· Limited ability to move the neck.   °· Headache.   °· Dizziness.   °· Weakness, numbness, or tingling in the hands or arms.   °· Muscle spasms.   °· Difficulty swallowing or chewing.   °· Tenderness and swelling of the neck.   °DIAGNOSIS  °Most of the time your health care provider can diagnose a cervical sprain by taking your history and doing a physical exam. Your health care provider will ask about previous neck injuries and any known neck  problems, such as arthritis in the neck. X-rays may be taken to find out if there are any other problems, such as with the bones of the neck. Other tests, such as a CT scan or MRI, may also be needed.  °TREATMENT  °Treatment depends on the severity of the cervical sprain. Mild sprains can be treated with rest, keeping the neck in place (immobilization), and pain medicines. Severe cervical sprains are immediately immobilized. Further treatment is done to help with pain, muscle spasms, and other symptoms and may include: °· Medicines, such as pain relievers, numbing medicines, or muscle relaxants.   °· Physical therapy. This may involve stretching exercises, strengthening exercises, and posture training. Exercises and improved posture can help stabilize the neck, strengthen muscles, and help stop symptoms from returning.   °HOME CARE INSTRUCTIONS  °· Put ice on the injured area.   °¨ Put ice in a plastic bag.   °¨ Place a towel between your skin and the bag.   °¨ Leave the ice on for 15-20 minutes, 3-4 times a day.   °· If your injury was severe, you may have been given a cervical collar to wear. A cervical collar is a two-piece collar designed to keep your neck from moving while it heals. °¨ Do not remove the collar unless instructed by your health care provider. °¨ If you have long hair, keep it outside of the collar. °¨ Ask your health care provider before making any adjustments to your collar. Minor   adjustments may be required over time to improve comfort and reduce pressure on your chin or on the back of your head. °¨ If you are allowed to remove the collar for cleaning or bathing, follow your health care provider's instructions on how to do so safely. °¨ Keep your collar clean by wiping it with mild soap and water and drying it completely. If the collar you have been given includes removable pads, remove them every 1-2 days and hand wash them with soap and water. Allow them to air dry. They should be completely  dry before you wear them in the collar. °¨ If you are allowed to remove the collar for cleaning and bathing, wash and dry the skin of your neck. Check your skin for irritation or sores. If you see any, tell your health care provider. °¨ Do not drive while wearing the collar.   °· Only take over-the-counter or prescription medicines for pain, discomfort, or fever as directed by your health care provider.   °· Keep all follow-up appointments as directed by your health care provider.   °· Keep all physical therapy appointments as directed by your health care provider.   °· Make any needed adjustments to your workstation to promote good posture.   °· Avoid positions and activities that make your symptoms worse.   °· Warm up and stretch before being active to help prevent problems.   °SEEK MEDICAL CARE IF:  °· Your pain is not controlled with medicine.   °· You are unable to decrease your pain medicine over time as planned.   °· Your activity level is not improving as expected.   °SEEK IMMEDIATE MEDICAL CARE IF:  °· You develop any bleeding. °· You develop stomach upset. °· You have signs of an allergic reaction to your medicine.   °· Your symptoms get worse.   °· You develop new, unexplained symptoms.   °· You have numbness, tingling, weakness, or paralysis in any part of your body.   °MAKE SURE YOU:  °· Understand these instructions. °· Will watch your condition. °· Will get help right away if you are not doing well or get worse. °Document Released: 10/11/2007 Document Revised: 12/19/2013 Document Reviewed: 06/21/2013 °ExitCare® Patient Information ©2015 [employer]. This information is not intended to replace advice given to you by your health care provider. Make sure you discuss any questions you have with your health care provider. ° °Motor Vehicle Collision °It is common to have multiple bruises and sore muscles after a motor vehicle collision (MVC). These tend to feel worse for the first 24 hours. You may have  the most stiffness and soreness over the first several hours. You may also feel worse when you wake up the first morning after your collision. After this point, you will usually begin to improve with each day. The speed of improvement often depends on the severity of the collision, the number of injuries, and the location and nature of these injuries. °HOME CARE INSTRUCTIONS °· Put ice on the injured area. °¨ Put ice in a plastic bag. °¨ Place a towel between your skin and the bag. °¨ Leave the ice on for 15-20 minutes, 3-4 times a day, or as directed by your health care provider. °· Drink enough fluids to keep your urine clear or pale yellow. Do not drink alcohol. °· Take a warm shower or bath once or twice a day. This will increase blood flow to sore muscles. °· You may return to activities as directed by your caregiver. Be careful when lifting, as this may aggravate neck or back   pain. °· Only take over-the-counter or prescription medicines for pain, discomfort, or fever as directed by your caregiver. Do not use aspirin. This may increase bruising and bleeding. °SEEK IMMEDIATE MEDICAL CARE IF: °· You have numbness, tingling, or weakness in the arms or legs. °· You develop severe headaches not relieved with medicine. °· You have severe neck pain, especially tenderness in the middle of the back of your neck. °· You have changes in bowel or bladder control. °· There is increasing pain in any area of the body. °· You have shortness of breath, light-headedness, dizziness, or fainting. °· You have chest pain. °· You feel sick to your stomach (nauseous), throw up (vomit), or sweat. °· You have increasing abdominal discomfort. °· There is blood in your urine, stool, or vomit. °· You have pain in your shoulder (shoulder strap areas). °· You feel your symptoms are getting worse. °MAKE SURE YOU: °· Understand these instructions. °· Will watch your condition. °· Will get help right away if you are not doing well or get  worse. °Document Released: 12/14/2005 Document Revised: 04/30/2014 Document Reviewed: 05/13/2011 °ExitCare® Patient Information ©2015 [employer]. This information is not intended to replace advice given to you by your health care provider. Make sure you discuss any questions you have with your health care provider. ° °

## 2015-09-20 NOTE — Progress Notes (Signed)
CM spoke with pt who confirms uninsured [place] resident with no pcp.  CM discussed and provided written information for uninsured accepting pcps, discussed the importance of pcp vs EDP services for f/u care, www.needymeds.org, www.goodrx.com, discounted pharmacies and other [place] resources such as [facility] , [facility], affordable care act, financial assistance, uninsured dental services, [place] med assist, DSS and  health department  Reviewed resources for [place] uninsured accepting pcps like [facility], [facility] [address], [facility], [facility], local urgent care centers, [facility], [facility], [facility] clinics, [facility], [facility] plus others, medication resources, [facility] out patient pharmacies and housing Pt voiced understanding and appreciation of resources provided   Provided [facility] contact information Pt agreed to a referral Cm completed referral Pt to be contact by [facility] clinical liason  

## 2015-09-27 ENCOUNTER — Emergency Department (HOSPITAL_COMMUNITY)
Admission: EM | Admit: 2015-09-27 | Discharge: 2015-09-27 | Disposition: A | Discharge status: Home / Self Care | Payer: No Typology Code available for payment source | Attending: Emergency Medicine | Admitting: Emergency Medicine

## 2015-09-27 ENCOUNTER — Encounter (HOSPITAL_COMMUNITY): Payer: Self-pay | Admitting: Emergency Medicine

## 2015-09-27 DIAGNOSIS — S161XXD Strain of muscle, fascia and tendon at neck level, subsequent encounter: Secondary | ICD-10-CM | POA: Insufficient documentation

## 2015-09-27 DIAGNOSIS — J45909 Unspecified asthma, uncomplicated: Secondary | ICD-10-CM | POA: Insufficient documentation

## 2015-09-27 DIAGNOSIS — Z720 Tobacco use: Secondary | ICD-10-CM | POA: Insufficient documentation

## 2015-09-27 DIAGNOSIS — Z86018 Personal history of other benign neoplasm: Secondary | ICD-10-CM | POA: Diagnosis not present

## 2015-09-27 DIAGNOSIS — Z87440 Personal history of urinary (tract) infections: Secondary | ICD-10-CM | POA: Diagnosis not present

## 2015-09-27 DIAGNOSIS — S199XXD Unspecified injury of neck, subsequent encounter: Secondary | ICD-10-CM | POA: Diagnosis present

## 2015-09-27 DIAGNOSIS — Z793 Long term (current) use of hormonal contraceptives: Secondary | ICD-10-CM | POA: Insufficient documentation

## 2015-09-27 MED ORDER — IBUPROFEN 800 MG PO TABS: 800.0000 mg | ORAL_TABLET | Freq: Three times a day (TID) | ORAL | Status: DC

## 2015-09-27 MED ORDER — CYCLOBENZAPRINE HCL 10 MG PO TABS: 10.0000 mg | ORAL_TABLET | Freq: Two times a day (BID) | ORAL | Status: DC | PRN

## 2015-09-27 MED ORDER — IBUPROFEN 800 MG PO TABS
800.0000 mg | ORAL_TABLET | Freq: Once | ORAL | Status: AC
  Administered 2015-09-27: 800 mg via ORAL
  Filled 2015-09-27: qty 1
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
## 2015-09-27 NOTE — Discharge Instructions (Signed)
Take ibuprofen as needed for pain. Take Flexeril as needed for muscle spasm. Continue to alternate heat and ice for pain relief.

## 2015-12-08 ENCOUNTER — Encounter (HOSPITAL_COMMUNITY): Payer: Self-pay | Admitting: *Deleted

## 2015-12-08 ENCOUNTER — Inpatient Hospital Stay (HOSPITAL_COMMUNITY): Payer: Medicaid Other

## 2015-12-08 ENCOUNTER — Inpatient Hospital Stay (HOSPITAL_COMMUNITY)
Admission: AD | Admit: 2015-12-08 | Discharge: 2015-12-08 | Disposition: A | Discharge status: Home / Self Care | Payer: Medicaid Other | Source: Ambulatory Visit | Attending: Obstetrics & Gynecology | Admitting: Obstetrics & Gynecology

## 2015-12-08 DIAGNOSIS — N760 Acute vaginitis: Secondary | ICD-10-CM

## 2015-12-08 DIAGNOSIS — R109 Unspecified abdominal pain: Secondary | ICD-10-CM

## 2015-12-08 DIAGNOSIS — B9689 Other specified bacterial agents as the cause of diseases classified elsewhere: Secondary | ICD-10-CM

## 2015-12-08 DIAGNOSIS — O99331 Smoking (tobacco) complicating pregnancy, first trimester: Secondary | ICD-10-CM | POA: Diagnosis not present

## 2015-12-08 DIAGNOSIS — Z3A12 12 weeks gestation of pregnancy: Secondary | ICD-10-CM | POA: Diagnosis not present

## 2015-12-08 DIAGNOSIS — F17210 Nicotine dependence, cigarettes, uncomplicated: Secondary | ICD-10-CM | POA: Insufficient documentation

## 2015-12-08 DIAGNOSIS — A499 Bacterial infection, unspecified: Secondary | ICD-10-CM

## 2015-12-08 DIAGNOSIS — O26899 Other specified pregnancy related conditions, unspecified trimester: Secondary | ICD-10-CM

## 2015-12-08 DIAGNOSIS — O23591 Infection of other part of genital tract in pregnancy, first trimester: Secondary | ICD-10-CM | POA: Diagnosis present

## 2015-12-08 LAB — HCG, QUANTITATIVE, PREGNANCY: hCG, Beta Chain, Quant, S: 65605 m[IU]/mL — ABNORMAL HIGH (ref <5)

## 2015-12-08 LAB — CBC
HEMATOCRIT: 33.1 % — AB (ref 36.0–46.0)
HEMOGLOBIN: 11.7 g/dL — AB (ref 12.0–15.0)
MCH: 32.1 pg (ref 26.0–34.0)
MCHC: 35.3 g/dL (ref 30.0–36.0)
MCV: 90.9 fL (ref 78.0–100.0)
Platelets: 272 10*3/uL (ref 150–400)
RBC: 3.64 MIL/uL — AB (ref 3.87–5.11)
RDW: 12.8 % (ref 11.5–15.5)
WBC: 7 10*3/uL (ref 4.0–10.5)

## 2015-12-08 LAB — WET PREP, GENITAL
SPERM: NONE SEEN
Trich, Wet Prep: NONE SEEN
Yeast Wet Prep HPF POC: NONE SEEN

## 2015-12-08 LAB — POCT PREGNANCY, URINE: Preg Test, Ur: POSITIVE — AB

## 2015-12-08 MED ORDER — PRENATAL VITAMINS PLUS 27-1 MG PO TABS: 1.0000 | ORAL_TABLET | Freq: Every day | ORAL | Status: DC

## 2015-12-08 MED ORDER — METRONIDAZOLE 500 MG PO TABS: 500.0000 mg | ORAL_TABLET | Freq: Two times a day (BID) | ORAL | Status: DC

## 2015-12-08 NOTE — MAU Provider Note (Signed)
History     CSN: [id number]  Arrival date and time: 12/08/15 1044   First Provider Initiated Contact with Patient 12/08/15 1132      Chief Complaint  Patient presents with  . Nausea  . Possible Pregnancy   HPI Maureen Simpson 39 y.o. Client very distressed over positive pregnancy test.  Reports cramping last night and requests further evaluation.  No vaginal bleeding.  Did not think she could get pregnant.  Her child is currently 66 years old.  She reports she had severe medical problems after the birth including being hospitalized postpartum for "fluid around my heart".  Is tearful and upset - even though she is not having pain now, she wants to be evaluated today.  Is unsure re: LMP.  Stopped taking pills in October.  OB History    Gravida Para Term Preterm AB TAB SAB Ectopic Multiple Living   2 1 1       1       Past Medical History  Diagnosis Date  . Lipoma     right forearm  . No pertinent past medical history   . Asthma   . UTI (lower urinary tract infection)   . Abnormal Pap smear     during preg    Past Surgical History  Procedure Laterality Date  . Lipoma excision  05/26/2012    Procedure: EXCISION LIPOMA;  Surgeon: Gayland Curry, MD,FACS;  Location: [facility];  Service: General;  Laterality: Right;  excision of right proximal forearm lipoma    Family History  Problem Relation Age of Onset  . Hypertension Mother   . Hypertension Maternal Aunt   . Cancer Maternal Grandmother     colon     Social History  Substance Use Topics  . Smoking status: Current Some Day Smoker -- 0.25 packs/day    Types: Cigarettes  . Smokeless tobacco: Never Used  . Alcohol Use: Yes     Comment: SOCIAL    Allergies: No Known Allergies  Prescriptions prior to admission  Medication Sig Dispense Refill Last Dose  . acetaminophen (TYLENOL) 500 MG tablet Take 1,000 mg by mouth every 6 (six) hours as needed for mild pain.   Taking  . cyclobenzaprine (FLEXERIL)  10 MG tablet Take 1 tablet (10 mg total) by mouth 2 (two) times daily as needed for muscle spasms. 20 tablet 0   . ibuprofen (ADVIL,MOTRIN) 800 MG tablet Take 1 tablet (800 mg total) by mouth 3 (three) times daily. 21 tablet 0   . norgestimate-ethinyl estradiol (ORTHO-CYCLEN,SPRINTEC,PREVIFEM) 0.25-35 MG-MCG tablet Take 1 tablet by mouth daily. 1 Package 11     Review of Systems  Constitutional: Negative for fever.  Gastrointestinal: Positive for abdominal pain. Negative for nausea and vomiting.  Genitourinary:       No vaginal discharge. No vaginal bleeding. No dysuria.   Physical Exam   Blood pressure 117/71, pulse 69, temperature 97.1 F (36.2 C), temperature source Oral, resp. rate 18, last menstrual period 10/27/2015.  Physical Exam  Nursing note and vitals reviewed. Constitutional: She is oriented to person, place, and time. She appears well-developed and well-nourished.  HENT:  Head: Normocephalic.  Eyes: EOM are normal.  Neck: Neck supple.  GI: Soft. There is no tenderness.  Genitourinary:  Speculum exam: Vagina - Mod amount of yellow discharge, Slightly frothy, odor noted Cervix - No contact bleeding Bimanual exam: Cervix closed Uterus non tender, enlarged Adnexa non tender, no masses bilaterally GC/Chlam, wet prep done Chaperone present  for exam.  Musculoskeletal: Normal range of motion.  Neurological: She is alert and oriented to person, place, and time.  Skin: Skin is warm and dry.  Psychiatric: She has a normal mood and affect.    MAU Course  Procedures CLINICAL DATA: Pelvic cramping and nausea  EXAM: OBSTETRIC <14 WK ULTRASOUND  TECHNIQUE: Transabdominal ultrasound was performed for evaluation of the gestation as well as the maternal uterus and adnexal regions.  COMPARISON: None.  FINDINGS: Intrauterine gestational sac: Visualized/normal in shape.  Yolk sac: Not visualized  Embryo: Visualized  Cardiac Activity:  Visualized  Heart Rate: 148 bpm  CRL: 64 mm 12 w 6 d [place] [facility]: June 15, 2016  Maternal uterus/adnexae: There is no demonstrable subchorionic hemorrhage. Amniotic fluid volume is normal for gestational age. There is placental tissue anteriorly without demonstrable abruption or previa. Cervical os is closed.  There is no maternal extrauterine pelvic or adnexal mass. Note that the left ovary is not well seen. No maternal free fluid.  IMPRESSION: Single live intrauterine gestation with estimated gestational age of approximately 28 weeks. Placenta anterior without demonstrable abruption or previa. Amniotic fluid volume normal. No subchorionic hemorrhage. Cervical os closed.   Electronically Signed  By: Lowella Grip III M.D.  On: 12/08/2015 13:09 MDM Reviewed previous notes.  After last delivery, client was readmitted on PP day 5 with SOB, HTN and edema.  Had a relatively normal cardiac ECHO.  Was given lasix and diuresed 15 pounds during her stay.  She went home on BP meds but was weaned off those and has not been continued on BP meds.  Results for orders placed or performed during the hospital encounter of 12/08/15 (from the past 24 hour(s))  Pregnancy, urine POC     Status: Abnormal   Collection Time: 12/08/15 11:14 AM  Result Value Ref Range   Preg Test, Ur POSITIVE (A) NEGATIVE  CBC     Status: Abnormal   Collection Time: 12/08/15 11:45 AM  Result Value Ref Range   WBC 7.0 4.0 - 10.5 K/uL   RBC 3.64 (L) 3.87 - 5.11 MIL/uL   Hemoglobin 11.7 (L) 12.0 - 15.0 g/dL   HCT 33.1 (L) 36.0 - 46.0 %   MCV 90.9 78.0 - 100.0 fL   MCH 32.1 26.0 - 34.0 pg   MCHC 35.3 30.0 - 36.0 g/dL   RDW 12.8 11.5 - 15.5 %   Platelets 272 150 - 400 K/uL  hCG, quantitative, pregnancy     Status: Abnormal   Collection Time: 12/08/15 11:45 AM  Result Value Ref Range   hCG, Beta Chain, Quant, S 65605 (H) <5 mIU/mL  Wet prep, genital     Status: Abnormal   Collection  Time: 12/08/15 12:35 PM  Result Value Ref Range   Yeast Wet Prep HPF POC NONE SEEN NONE SEEN   Trich, Wet Prep NONE SEEN NONE SEEN   Clue Cells Wet Prep HPF POC PRESENT (A) NONE SEEN   WBC, Wet Prep HPF POC FEW (A) NONE SEEN   Sperm NONE SEEN      Assessment and Plan  Bacterial vaginosis IUP at [redacted]w[redacted]d  Plan No smoking, no drugs, no alcohol.  Take a prenatal vitamin one by mouth every day.  Eat small frequent snacks to avoid nausea.  Begin prenatal care as soon as possible. Discussed with client previous discharge summary notes. Will prescribe prenatal vitamins and metronidazole for BV to her pharmacy.  BURLESON,TERRI 12/08/2015, 11:32 AM

## 2015-12-08 NOTE — Discharge Instructions (Signed)
No smoking, no drugs, no alcohol.  Take a prenatal vitamin one by mouth every day.  Eat small frequent snacks to avoid nausea.  Begin prenatal care as soon as possible. Get your medication from your pharmacy.

## 2015-12-08 NOTE — MAU Note (Signed)
Pt C/O feeling very nauseated, no vomiting.   Took two HPT's, was faint positive, pt unsure about result.  Was cramping last night, none today.  Denies bleeding.

## 2015-12-09 LAB — HIV ANTIBODY (ROUTINE TESTING W REFLEX): HIV SCREEN 4TH GENERATION: NONREACTIVE

## 2015-12-09 LAB — RPR: RPR: NONREACTIVE

## 2015-12-09 LAB — GC/CHLAMYDIA PROBE AMP (~~LOC~~) NOT AT ARMC
Chlamydia: NEGATIVE
Neisseria Gonorrhea: NEGATIVE

## 2015-12-29 NOTE — L&D Delivery Note (Signed)
Delivery Note After about a 45 minute second stage,At 12:00 AM a viable female was delivered via Vaginal, Spontaneous Delivery (Presentation:OA;  ).  APGAR: ,3/8 weight 8# 6 oz   .The shoulders were not forthcoming, so the posterior (left) axilla was grasped with my index finger, and the baby was rotated clockwise into the oblique diameter.  At this point, the (now) anterior shoulder was released, and the baby delivered.  At no time was any traction placed on the baby's head. Dr. Roselie Awkward was called to the room, and arrived as the shoulders were being released. Total shoulder dystocia:  2 minutes. The cord was milked towards the umbilicus, quickly clamped and cut d/t need for resuscitation.  40 units of pitocin diluted in 1000cc LR was infused rapidly IV.  The placenta separated spontaneously and delivered via CCT and maternal pushing effort.  It was inspected and appears to be intact with a 3 VC.    Marland Kitchen     Cord pH: 7.199  Anesthesia: Epidural  Episiotomy: None Lacerations: None Suture Repair:  Est. Blood Loss (mL):  200  Mom to postpartum.  Baby to Couplet care / Skin to Skin.  Maureen Simpson,Maureen Simpson 06/19/2016, 12:16 AM

## 2016-01-14 ENCOUNTER — Encounter: Payer: Self-pay | Admitting: Student

## 2016-01-14 ENCOUNTER — Ambulatory Visit (INDEPENDENT_AMBULATORY_CARE_PROVIDER_SITE_OTHER): Payer: Self-pay | Admitting: Student

## 2016-01-14 VITALS — BP 110/64 | HR 72 | Temp 98.2°F | Wt 153.9 lb

## 2016-01-14 DIAGNOSIS — Z23 Encounter for immunization: Secondary | ICD-10-CM

## 2016-01-14 DIAGNOSIS — Z124 Encounter for screening for malignant neoplasm of cervix: Secondary | ICD-10-CM

## 2016-01-14 DIAGNOSIS — Z3482 Encounter for supervision of other normal pregnancy, second trimester: Secondary | ICD-10-CM

## 2016-01-14 DIAGNOSIS — Z3493 Encounter for supervision of normal pregnancy, unspecified, third trimester: Secondary | ICD-10-CM | POA: Insufficient documentation

## 2016-01-14 DIAGNOSIS — Z3492 Encounter for supervision of normal pregnancy, unspecified, second trimester: Secondary | ICD-10-CM

## 2016-01-14 LAB — POCT URINALYSIS DIP (DEVICE)
BILIRUBIN URINE: NEGATIVE
Glucose, UA: NEGATIVE mg/dL
Hgb urine dipstick: NEGATIVE
KETONES UR: NEGATIVE mg/dL
NITRITE: NEGATIVE
PH: 7 (ref 5.0–8.0)
Protein, ur: NEGATIVE mg/dL
Specific Gravity, Urine: 1.02 (ref 1.005–1.030)
Urobilinogen, UA: 0.2 mg/dL (ref 0.0–1.0)

## 2016-01-14 NOTE — Progress Notes (Signed)
  Subjective:    Maureen Simpson is being seen today for her first obstetrical visit.  This is not a planned pregnancy. She is at [redacted]w[redacted]d gestation. Her obstetrical history is significant for advanced maternal age. Relationship with FOB: significant other, living together. Patient does intend to breast feed. Pregnancy history fully reviewed.  Patient reports no complaints.  Review of Systems:   Review of Systems Review of Systems - History obtained from the patient General ROS: negative Respiratory ROS: no cough, shortness of breath, or wheezing Cardiovascular ROS: no chest pain or dyspnea on exertion Gastrointestinal ROS: no abdominal pain, change in bowel habits, or black or bloody stools Genito-Urinary ROS: no dysuria, trouble voiding, or hematuria  Objective:     BP 110/64 mmHg  Pulse 72  Temp(Src) 98.2 F (36.8 C)  Wt 153 lb 14.4 oz (69.809 kg)  LMP 10/27/2015 (Approximate) Physical Exam  Exam   Physical Examination: General appearance - alert, well appearing, and in no distress Neck - supple, no significant adenopathy Chest - clear to auscultation, no wheezes, rales or rhonchi, symmetric air entry Heart - normal rate, regular rhythm, normal S1, S2, no murmurs, rubs, clicks or gallops Abdomen - soft, nontender, fundal height 1 below umbilicus Pelvic - normal external genitalia, vulva, vagina, cervix, uterus and adnexa, PAP: Pap smear done today. Small amount of thin white discharge. Cervix closed. No CMT  Assessment:    Pregnancy: G2P1001 Patient Active Problem List   Diagnosis Date Noted  . Supervision of normal pregnancy in second trimester 01/14/2016  . Boil of trunk 12/18/2013  . Hypertension, postpartum condition or complication [id number]  . Shortness of breath 11/22/2013  . ASC-cannot exclude HGSIL on Pap 05/05/2013  . Lipoma of arm 04/14/2012       Plan:  1. Supervision of normal pregnancy in second trimester  - Cytology - PAP - Prescript Monitor  Profile(19) - Culture, OB Urine - Prenatal Profile - Flu Vaccine QUAD 36+ mos IM; Standing - [place] MFM OB COMP + 14 WK; Future - Flu Vaccine QUAD 36+ mos IM - AFP, Quad Screen    Initial labs drawn. Prenatal vitamins. Problem list reviewed and updated. Quad screen orderd.  Role of ultrasound in pregnancy discussed; fetal survey: ordered. Follow up in 4 weeks.    Jorje Guild 01/14/2016

## 2016-01-14 NOTE — Patient Instructions (Signed)

## 2016-01-15 LAB — PRENATAL PROFILE (SOLSTAS)
Antibody Screen: NEGATIVE
BASOS ABS: 0 10*3/uL (ref 0.0–0.1)
BASOS PCT: 0 % (ref 0–1)
Eosinophils Absolute: 0.2 10*3/uL (ref 0.0–0.7)
Eosinophils Relative: 2 % (ref 0–5)
HEMATOCRIT: 32.3 % — AB (ref 36.0–46.0)
HEMOGLOBIN: 11.1 g/dL — AB (ref 12.0–15.0)
HEP B S AG: NEGATIVE
HIV: NONREACTIVE
LYMPHS PCT: 24 % (ref 12–46)
Lymphs Abs: 2.1 10*3/uL (ref 0.7–4.0)
MCH: 31.9 pg (ref 26.0–34.0)
MCHC: 34.4 g/dL (ref 30.0–36.0)
MCV: 92.8 fL (ref 78.0–100.0)
MONO ABS: 0.6 10*3/uL (ref 0.1–1.0)
MPV: 8.9 fL (ref 8.6–12.4)
Monocytes Relative: 7 % (ref 3–12)
NEUTROS ABS: 5.9 10*3/uL (ref 1.7–7.7)
NEUTROS PCT: 67 % (ref 43–77)
Platelets: 270 10*3/uL (ref 150–400)
RBC: 3.48 MIL/uL — AB (ref 3.87–5.11)
RDW: 13.5 % (ref 11.5–15.5)
Rh Type: POSITIVE
Rubella: 7.36 Index — ABNORMAL HIGH (ref <0.90)
WBC: 8.8 10*3/uL (ref 4.0–10.5)

## 2016-01-15 LAB — PRESCRIPTION MONITORING PROFILE (19 PANEL)
AMPHETAMINE/METH: NEGATIVE ng/mL
BUPRENORPHINE, URINE: NEGATIVE ng/mL
Barbiturate Screen, Urine: NEGATIVE ng/mL
Benzodiazepine Screen, Urine: NEGATIVE ng/mL
CANNABINOID SCRN UR: NEGATIVE ng/mL
CARISOPRODOL, URINE: NEGATIVE ng/mL
Cocaine Metabolites: NEGATIVE ng/mL
Creatinine, Urine: 135.41 mg/dL (ref >20.0)
ECSTASY: NEGATIVE ng/mL
FENTANYL URINE: NEGATIVE ng/mL
MEPERIDINE UR: NEGATIVE ng/mL
METHADONE SCREEN, URINE: NEGATIVE ng/mL
Methaqualone: NEGATIVE ng/mL
NITRITES URINE, INITIAL: NEGATIVE ug/mL
OXYCODONE SCRN UR: NEGATIVE ng/mL
Opiate Screen, Urine: NEGATIVE ng/mL
PROPOXYPHENE: NEGATIVE ng/mL
Phencyclidine, Ur: NEGATIVE ng/mL
TAPENTADOLUR: NEGATIVE ng/mL
Tramadol Scrn, Ur: NEGATIVE ng/mL
Zolpidem, Urine: NEGATIVE ng/mL
pH, Initial: 6.8 pH (ref 4.5–8.9)

## 2016-01-16 LAB — CULTURE, OB URINE
COLONY COUNT: NO GROWTH
ORGANISM ID, BACTERIA: NO GROWTH

## 2016-01-17 LAB — AFP, QUAD SCREEN
AFP: 54.5 ng/mL
Age Alone: 1:110 {titer}
Curr Gest Age: 18.1 wks.days
HCG, Total: 24.86 IU/mL
INH: 144.9 pg/mL
INTERPRETATION-AFP: NEGATIVE
MoM for AFP: 1.09
MoM for INH: 0.86
MoM for hCG: 0.87
Open Spina bifida: NEGATIVE
TRI 18 SCR RISK EST: NEGATIVE
Trisomy 18 (Edward) Syndrome Interp.: 1:8550 {titer}
UE3 MOM: 1.13
UE3 VALUE: 1.49 ng/mL

## 2016-01-17 LAB — CYTOLOGY - PAP

## 2016-01-20 ENCOUNTER — Other Ambulatory Visit: Payer: Self-pay | Admitting: Obstetrics & Gynecology

## 2016-01-21 ENCOUNTER — Telehealth: Payer: Self-pay | Admitting: *Deleted

## 2016-01-21 NOTE — Telephone Encounter (Signed)
Cheron called back again and left a message she is calling about results.    Called Arwa and gave her the results of quad screen which was negative and other blood work negative.  Answered her questions. She is worried about birth defects. She voiced understanding. I reviewed her next ob appointment with her.

## 2016-01-21 NOTE — Telephone Encounter (Signed)
Sitlali left a voicemail this am requesting results from her visit last week. She left a number to call [phone].   Called Yavonne at the number she left and heard a message this number is not taking calls now , unable to leave a message. Called her contact number   and left a voicemail I am returning her call, please call clinic.

## 2016-01-22 ENCOUNTER — Ambulatory Visit (HOSPITAL_COMMUNITY)
Admission: RE | Admit: 2016-01-22 | Discharge: 2016-01-22 | Disposition: A | Discharge status: Home / Self Care | Payer: Medicaid Other | Source: Ambulatory Visit | Attending: Student | Admitting: Student

## 2016-01-22 ENCOUNTER — Other Ambulatory Visit: Payer: Self-pay | Admitting: Student

## 2016-01-22 DIAGNOSIS — IMO0001 Reserved for inherently not codable concepts without codable children: Secondary | ICD-10-CM

## 2016-01-22 DIAGNOSIS — Z3689 Encounter for other specified antenatal screening: Secondary | ICD-10-CM

## 2016-01-22 DIAGNOSIS — Z3A19 19 weeks gestation of pregnancy: Secondary | ICD-10-CM

## 2016-01-22 DIAGNOSIS — Z36 Encounter for antenatal screening of mother: Secondary | ICD-10-CM | POA: Insufficient documentation

## 2016-01-22 DIAGNOSIS — O09522 Supervision of elderly multigravida, second trimester: Secondary | ICD-10-CM | POA: Insufficient documentation

## 2016-01-22 DIAGNOSIS — Z3492 Encounter for supervision of normal pregnancy, unspecified, second trimester: Secondary | ICD-10-CM

## 2016-01-24 ENCOUNTER — Telehealth: Payer: Self-pay

## 2016-01-24 NOTE — Telephone Encounter (Signed)
Pt called requesting results of [place].  Called pt and informed pt that her [place] results were normal.  Pt stated understanding.

## 2016-02-11 ENCOUNTER — Ambulatory Visit (INDEPENDENT_AMBULATORY_CARE_PROVIDER_SITE_OTHER): Payer: Self-pay | Admitting: Obstetrics and Gynecology

## 2016-02-11 ENCOUNTER — Encounter: Payer: Self-pay | Admitting: Obstetrics and Gynecology

## 2016-02-11 ENCOUNTER — Encounter: Payer: Self-pay | Admitting: Obstetrics & Gynecology

## 2016-02-11 VITALS — BP 112/71 | HR 72 | Temp 98.6°F | Wt 154.5 lb

## 2016-02-11 DIAGNOSIS — O09522 Supervision of elderly multigravida, second trimester: Secondary | ICD-10-CM

## 2016-02-11 DIAGNOSIS — O09529 Supervision of elderly multigravida, unspecified trimester: Secondary | ICD-10-CM | POA: Insufficient documentation

## 2016-02-11 DIAGNOSIS — Z3492 Encounter for supervision of normal pregnancy, unspecified, second trimester: Secondary | ICD-10-CM

## 2016-02-11 LAB — POCT URINALYSIS DIP (DEVICE)
BILIRUBIN URINE: NEGATIVE
Glucose, UA: NEGATIVE mg/dL
KETONES UR: NEGATIVE mg/dL
Nitrite: NEGATIVE
PH: 7 (ref 5.0–8.0)
PROTEIN: NEGATIVE mg/dL
SPECIFIC GRAVITY, URINE: 1.02 (ref 1.005–1.030)
Urobilinogen, UA: 0.2 mg/dL (ref 0.0–1.0)

## 2016-02-11 NOTE — Patient Instructions (Signed)

## 2016-02-11 NOTE — Progress Notes (Signed)
Subjective:  Maureen Simpson is a 40 y.o. G2P1001 at [redacted]w[redacted]d being seen today for ongoing prenatal care.  She is currently monitored for the following issues for this low-risk pregnancy and has ASC-cannot exclude HGSIL on Pap; Hypertension, postpartum condition or complication; Supervision of normal pregnancy in second trimester; and AMA (advanced maternal age) multigravida 35+ on her problem list. Works FT at [employer].  Patient reports no complaints.  Contractions: Not present. Vag. Bleeding: None.  Movement: Present. Denies leaking of fluid.   The following portions of the patient's history were reviewed and updated as appropriate: allergies, current medications, past family history, past medical history, past social history, past surgical history and problem list. Problem list updated.  Objective:   Filed Vitals:   02/11/16 1617  BP: 112/71  Pulse: 72  Temp: 98.6 F (37 C)  Weight: 154 lb 8 oz (70.081 kg)    Fetal Status: Fetal Heart Rate (bpm): 151   Movement: Present     General:  Alert, oriented and cooperative. Patient is in no acute distress.  Skin: Skin is warm and dry. No rash noted.   Cardiovascular: Normal heart rate noted  Respiratory: Normal respiratory effort, no problems with respiration noted  Abdomen: Soft, gravid, appropriate for gestational age. Pain/Pressure: Absent     Pelvic: Vag. Bleeding: None     Cervical exam deferred        Extremities: Normal range of motion.     Mental Status: Normal mood and affect. Normal behavior. Normal judgment and thought content.   Urinalysis:      Assessment and Plan:  Pregnancy: G2P1001 at [redacted]w[redacted]d  1. Supervision of normal pregnancy in second trimester Doing well  2. AMA (advanced maternal age) multigravida 54+, second trimester F/U US in third tri  Preterm labor symptoms and general obstetric precautions including but not limited to vaginal bleeding, contractions, leaking of fluid and fetal movement were reviewed in detail  with the patient. Please refer to After Visit Summary for other counseling recommendations.  Return in about 1 month (around 03/10/2016).   Lorene Dy, CNM

## 2016-03-02 ENCOUNTER — Other Ambulatory Visit: Payer: Self-pay | Admitting: Obstetrics & Gynecology

## 2016-03-17 ENCOUNTER — Ambulatory Visit (INDEPENDENT_AMBULATORY_CARE_PROVIDER_SITE_OTHER): Payer: Medicaid Other | Admitting: Obstetrics and Gynecology

## 2016-03-17 VITALS — BP 124/71 | HR 81 | Temp 98.3°F | Wt 167.1 lb

## 2016-03-17 DIAGNOSIS — O09523 Supervision of elderly multigravida, third trimester: Secondary | ICD-10-CM

## 2016-03-17 DIAGNOSIS — Z3492 Encounter for supervision of normal pregnancy, unspecified, second trimester: Secondary | ICD-10-CM

## 2016-03-17 DIAGNOSIS — O99891 Other specified diseases and conditions complicating pregnancy: Secondary | ICD-10-CM

## 2016-03-17 DIAGNOSIS — M549 Dorsalgia, unspecified: Secondary | ICD-10-CM | POA: Diagnosis not present

## 2016-03-17 DIAGNOSIS — Z3482 Encounter for supervision of other normal pregnancy, second trimester: Secondary | ICD-10-CM

## 2016-03-17 DIAGNOSIS — Z23 Encounter for immunization: Secondary | ICD-10-CM | POA: Diagnosis not present

## 2016-03-17 DIAGNOSIS — O26899 Other specified pregnancy related conditions, unspecified trimester: Secondary | ICD-10-CM

## 2016-03-17 DIAGNOSIS — O165 Unspecified maternal hypertension, complicating the puerperium: Secondary | ICD-10-CM

## 2016-03-17 DIAGNOSIS — O9989 Other specified diseases and conditions complicating pregnancy, childbirth and the puerperium: Secondary | ICD-10-CM

## 2016-03-17 LAB — POCT URINALYSIS DIP (DEVICE)
Bilirubin Urine: NEGATIVE
Glucose, UA: NEGATIVE mg/dL
KETONES UR: NEGATIVE mg/dL
Leukocytes, UA: NEGATIVE
Nitrite: NEGATIVE
PH: 7 (ref 5.0–8.0)
PROTEIN: NEGATIVE mg/dL
SPECIFIC GRAVITY, URINE: 1.02 (ref 1.005–1.030)
Urobilinogen, UA: 1 mg/dL (ref 0.0–1.0)

## 2016-03-17 MED ORDER — CYCLOBENZAPRINE HCL 10 MG PO TABS: 10.0000 mg | ORAL_TABLET | Freq: Three times a day (TID) | ORAL | Status: DC | PRN

## 2016-03-17 NOTE — Progress Notes (Signed)
Subjective:  Maureen Simpson is a 40 y.o. G2P1001 at [redacted]w[redacted]d being seen today for ongoing prenatal care.  She is currently monitored for the following issues for this low-risk pregnancy and has ASC-cannot exclude HGSIL on Pap; Hypertension, postpartum condition or complication; Supervision of normal pregnancy in second trimester; and AMA (advanced maternal age) multigravida 35+ on her problem list.  Patient reports back pain last week - sharp going down leg.  Contractions: Not present. Vag. Bleeding: None.  Movement: Present. Denies leaking of fluid.   The following portions of the patient's history were reviewed and updated as appropriate: allergies, current medications, past family history, past medical history, past social history, past surgical history and problem list. Problem list updated.  Objective:   Filed Vitals:   03/17/16 1627  BP: 124/71  Pulse: 81  Temp: 98.3 F (36.8 C)  Weight: 167 lb 1.6 oz (75.796 kg)    Fetal Status: Fetal Heart Rate (bpm): 145 Fundal Height: 27 cm Movement: Present     General:  Alert, oriented and cooperative. Patient is in no acute distress.  Skin: Skin is warm and dry. No rash noted.   Cardiovascular: Normal heart rate noted  Respiratory: Normal respiratory effort, no problems with respiration noted  Abdomen: Soft, gravid, appropriate for gestational age. Pain/Pressure: Present     Pelvic: Vag. Bleeding: None     Cervical exam deferred        Extremities: Normal range of motion.     Mental Status: Normal mood and affect. Normal behavior. Normal judgment and thought content.   Urinalysis:      Assessment and Plan:  Pregnancy: G2P1001 at [redacted]w[redacted]d  1. Need for Tdap vaccination - Received today - Tdap vaccine greater than or equal to 7yo IM  2. Hypertension, postpartum condition or complication - BP wnl today  3. AMA (advanced maternal age) multigravida 39+, third trimester - [place] MFM OB FOLLOW UP; Future-- recommended at anatomy US due to  [place]  4. Supervision of normal pregnancy in second trimester - was unable to stay for 1hr gtt and other 3rd trimester labs, will have patient reschedule - updated box - BTL papers signed today  5. Back pain affecting pregnancy -likely sciatica - cyclobenzaprine (FLEXERIL) 10 MG tablet; Take 1 tablet (10 mg total) by mouth 3 (three) times daily as needed for muscle spasms.  Dispense: 30 tablet; Refill: 2  6. Vaginal odor - possible BV but no other sx. Recommended increased probiotics  - If she call in 1 week, recommend metronidazole gel   Preterm labor symptoms and general obstetric precautions including but not limited to vaginal bleeding, contractions, leaking of fluid and fetal movement were reviewed in detail with the patient. Please refer to After Visit Summary for other counseling recommendations.   Return in about 4 weeks (around 04/14/2016) for Routine prenatal care.  Caren Macadam, MD

## 2016-03-17 NOTE — Addendum Note (Signed)
Addended by: Phillip Heal, Ileigh Mettler A on: 03/17/2016 05:14 PM   Modules accepted: Orders

## 2016-03-17 NOTE — Patient Instructions (Addendum)
For your vaginal odor:  Try increasing yogurt in your diet Try drinking Kombucha- sold in cold beverage section IF still present in 1 week or you develop itching call the clinic   Back Pain in Pregnancy Back pain during pregnancy is common. It happens in about half of all pregnancies. It is important for you and your baby that you remain active during your pregnancy.If you feel that back pain is not allowing you to remain active or sleep well, it is time to see your caregiver. Back pain may be caused by several factors related to changes during your pregnancy.Fortunately, unless you had trouble with your back before your pregnancy, the pain is likely to get better after you deliver. Low back pain usually occurs between the fifth and seventh months of pregnancy. It can, however, happen in the first couple months. Factors that increase the risk of back problems include:   Previous back problems.  Injury to your back.  Having twins or multiple births.  A chronic cough.  Stress.  Job-related repetitive motions.  Muscle or spinal disease in the back.  Family history of back problems, ruptured (herniated) discs, or osteoporosis.  Depression, anxiety, and panic attacks. CAUSES   When you are pregnant, your body produces a hormone called relaxin. This hormonemakes the ligaments connecting the low back and pubic bones more flexible. This flexibility allows the baby to be delivered more easily. When your ligaments are loose, your muscles need to work harder to support your back. Soreness in your back can come from tired muscles. Soreness can also come from back tissues that are irritated since they are receiving less support.  As the baby grows, it puts pressure on the nerves and blood vessels in your pelvis. This can cause back pain.  As the baby grows and gets heavier during pregnancy, the uterus pushes the stomach muscles forward and changes your center of gravity. This makes your back  muscles work harder to maintain good posture. SYMPTOMS  Lumbar pain during pregnancy Lumbar pain during pregnancy usually occurs at or above the waist in the center of the back. There may be pain and numbness that radiates into your leg or foot. This is similar to low back pain experienced by non-pregnant women. It usually increases with sitting for long periods of time, standing, or repetitive lifting. Tenderness may also be present in the muscles along your upper back. Posterior pelvic pain during pregnancy Pain in the back of the pelvis is more common than lumbar pain in pregnancy. It is a deep pain felt in your side at the waistline, or across the tailbone (sacrum), or in both places. You may have pain on one or both sides. This pain can also go into the buttocks and backs of the upper thighs. Pubic and groin pain may also be present. The pain does not quickly resolve with rest, and morning stiffness may also be present. Pelvic pain during pregnancy can be brought on by most activities. A high level of fitness before and during pregnancy may or may not prevent this problem. Labor pain is usually 1 to 2 minutes apart, lasts for about 1 minute, and involves a bearing down feeling or pressure in your pelvis. However, if you are at term with the pregnancy, constant low back pain can be the beginning of early labor, and you should be aware of this. DIAGNOSIS  X-rays of the back should not be done during the first 12 to 14 weeks of the pregnancy and only  when absolutely necessary during the rest of the pregnancy. MRIs do not give off radiation and are safe during pregnancy. MRIs also should only be done when absolutely necessary. HOME CARE INSTRUCTIONS  Exercise as directed by your caregiver. Exercise is the most effective way to prevent or manage back pain. If you have a back problem, it is especially important to avoid sports that require sudden body movements. Swimming and walking are great  activities.  Do not stand in one place for long periods of time.  Do not wear high heels.  Sit in chairs with good posture. Use a pillow on your lower back if necessary. Make sure your head rests over your shoulders and is not hanging forward.  Try sleeping on your side, preferably the left side, with a pillow or two between your legs. If you are sore after a night's rest, your bedmay betoo soft.Try placing a board between your mattress and box spring.  Listen to your body when lifting.If you are experiencing pain, ask for help or try bending yourknees more so you can use your leg muscles rather than your back muscles. Squat down when picking up something from the floor. Do not bend over.  Eat a healthy diet. Try to gain weight within your caregiver's recommendations.  Use heat or cold packs 3 to 4 times a day for 15 minutes to help with the pain.  Only take over-the-counter or prescription medicines for pain, discomfort, or fever as directed by your caregiver. Sudden (acute) back pain  Use bed rest for only the most extreme, acute episodes of back pain. Prolonged bed rest over 48 hours will aggravate your condition.  Ice is very effective for acute conditions.  Put ice in a plastic bag.  Place a towel between your skin and the bag.  Leave the ice on for 10 to 20 minutes every 2 hours, or as needed.  Using heat packs for 30 minutes prior to activities is also helpful. Continued back pain See your caregiver if you have continued problems. Your caregiver can help or refer you for appropriate physical therapy. With conditioning, most back problems can be avoided. Sometimes, a more serious issue may be the cause of back pain. You should be seen right away if new problems seem to be developing. Your caregiver may recommend:  A maternity girdle.  An elastic sling.  A back brace.  A massage therapist or acupuncture. SEEK MEDICAL CARE IF:   You are not able to do most of your  daily activities, even when taking the pain medicine you were given.  You need a referral to a physical therapist or chiropractor.  You want to try acupuncture. SEEK IMMEDIATE MEDICAL CARE IF:  You develop numbness, tingling, weakness, or problems with the use of your arms or legs.  You develop severe back pain that is no longer relieved with medicines.  You have a sudden change in bowel or bladder control.  You have increasing pain in other areas of the body.  You develop shortness of breath, dizziness, or fainting.  You develop nausea, vomiting, or sweating.  You have back pain which is similar to labor pains.  You have back pain along with your water breaking or vaginal bleeding.  You have back pain or numbness that travels down your leg.  Your back pain developed after you fell.  You develop pain on one side of your back. You may have a kidney stone.  You see blood in your urine. You may  have a bladder infection or kidney stone.  You have back pain with blisters. You may have shingles. Back pain is fairly common during pregnancy but should not be accepted as just part of the process. Back pain should always be treated as soon as possible. This will make your pregnancy as pleasant as possible.   This information is not intended to replace advice given to you by your health care provider. Make sure you discuss any questions you have with your health care provider.   Document Released: 03/24/2006 Document Revised: 03/07/2012 Document Reviewed: 05/05/2011 Elsevier Interactive Patient Education [employer].

## 2016-03-20 ENCOUNTER — Encounter: Payer: Self-pay | Admitting: *Deleted

## 2016-03-23 ENCOUNTER — Other Ambulatory Visit: Payer: Medicaid Other

## 2016-03-26 ENCOUNTER — Telehealth: Payer: Self-pay | Admitting: General Practice

## 2016-03-26 DIAGNOSIS — N760 Acute vaginitis: Principal | ICD-10-CM

## 2016-03-26 DIAGNOSIS — B9689 Other specified bacterial agents as the cause of diseases classified elsewhere: Secondary | ICD-10-CM

## 2016-03-26 MED ORDER — METRONIDAZOLE 500 MG PO TABS: 500.0000 mg | ORAL_TABLET | Freq: Two times a day (BID) | ORAL | Status: DC

## 2016-03-26 NOTE — Telephone Encounter (Signed)
Patient called and left message requesting prescription for BV. Called patient stating we have received her phone message and will send in that prescription for you. Patient verbalized understanding & had no questions

## 2016-04-03 ENCOUNTER — Inpatient Hospital Stay (HOSPITAL_COMMUNITY)
Admission: AD | Admit: 2016-04-03 | Discharge: 2016-04-03 | Disposition: A | Discharge status: Home / Self Care | Payer: Medicaid Other | Source: Ambulatory Visit | Attending: Obstetrics & Gynecology | Admitting: Obstetrics & Gynecology

## 2016-04-03 ENCOUNTER — Encounter (HOSPITAL_COMMUNITY): Payer: Self-pay | Admitting: *Deleted

## 2016-04-03 DIAGNOSIS — O9989 Other specified diseases and conditions complicating pregnancy, childbirth and the puerperium: Secondary | ICD-10-CM | POA: Diagnosis not present

## 2016-04-03 DIAGNOSIS — Z79899 Other long term (current) drug therapy: Secondary | ICD-10-CM | POA: Insufficient documentation

## 2016-04-03 DIAGNOSIS — O26893 Other specified pregnancy related conditions, third trimester: Secondary | ICD-10-CM | POA: Diagnosis not present

## 2016-04-03 DIAGNOSIS — Z3A29 29 weeks gestation of pregnancy: Secondary | ICD-10-CM | POA: Insufficient documentation

## 2016-04-03 DIAGNOSIS — R109 Unspecified abdominal pain: Secondary | ICD-10-CM | POA: Diagnosis present

## 2016-04-03 DIAGNOSIS — R1032 Left lower quadrant pain: Secondary | ICD-10-CM | POA: Diagnosis not present

## 2016-04-03 DIAGNOSIS — R1031 Right lower quadrant pain: Secondary | ICD-10-CM | POA: Diagnosis not present

## 2016-04-03 DIAGNOSIS — Z87891 Personal history of nicotine dependence: Secondary | ICD-10-CM | POA: Diagnosis not present

## 2016-04-03 LAB — URINALYSIS, ROUTINE W REFLEX MICROSCOPIC
BILIRUBIN URINE: NEGATIVE
Glucose, UA: NEGATIVE mg/dL
HGB URINE DIPSTICK: NEGATIVE
Ketones, ur: NEGATIVE mg/dL
Leukocytes, UA: NEGATIVE
NITRITE: NEGATIVE
PROTEIN: NEGATIVE mg/dL
Specific Gravity, Urine: 1.015 (ref 1.005–1.030)
pH: 6.5 (ref 5.0–8.0)

## 2016-04-03 NOTE — Discharge Instructions (Signed)
You were seen in the MAU for abdominal cramping.  You and your baby were monitored/ evaluated for signs of preterm labor.  Your cervix was closed (this is good!).  There was not evidence of preterm labor.  I suspect that your symptoms were brought on by excessive stress.  Please follow up with your OB as scheduled.  If you develop any of the following, return to the MAU for evaluation:  Period-like cramps, belly (abdominal) pain, or back pain.  Contractions that are regular, as often as six in an hour. They may be mild or painful.  Contractions that start at the top of the belly. They then move to the lower belly and back.  Lower belly pressure that seems to get stronger.  Bleeding from the vagina.  Fluid leaking from the vagina. Preterm Labor Information Preterm labor is when labor starts before you are [redacted] weeks pregnant. The normal length of pregnancy is 39 to 41 weeks.  CAUSES  The cause of preterm labor is not often known. The most common known cause is infection. RISK FACTORS  Having a history of preterm labor.  Having your water break before it should.  Having a placenta that covers the opening of the cervix.  Having a placenta that breaks away from the uterus.  Having a cervix that is too weak to hold the baby in the uterus.  Having too much fluid in the amniotic sac.  Taking drugs or smoking while pregnant.  Not gaining enough weight while pregnant.  Being younger than 4 and older than 40 years old.  Having a low income.  Being African American. SYMPTOMS  Period-like cramps, belly (abdominal) pain, or back pain.  Contractions that are regular, as often as six in an hour. They may be mild or painful.  Contractions that start at the top of the belly. They then move to the lower belly and back.  Lower belly pressure that seems to get stronger.  Bleeding from the vagina.  Fluid leaking from the vagina. TREATMENT  Treatment depends on:  Your  condition.  The condition of your baby.  How many weeks pregnant you are. Your doctor may have you:  Take medicine to stop contractions.  Stay in bed except to use the restroom (bed rest).  Stay in the hospital. WHAT SHOULD YOU DO IF YOU THINK YOU ARE IN PRETERM LABOR? Call your doctor right away. You need to go to the hospital right away.  HOW CAN YOU PREVENT PRETERM LABOR IN FUTURE PREGNANCIES?  Stop smoking, if you smoke.  Maintain healthy weight gain.  Do not take drugs or be around chemicals that are not needed.  Tell your doctor if you think you have an infection.  Tell your doctor if you had a preterm labor before.   This information is not intended to replace advice given to you by your health care provider. Make sure you discuss any questions you have with your health care provider.   Document Released: 03/12/2009 Document Revised: 04/30/2015 Document Reviewed: 01/16/2013 Elsevier Interactive Patient Education [employer].

## 2016-04-03 NOTE — MAU Note (Signed)
abd cramping tonight. Denies LOF or bleeding. States has been under added stress recently.
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
## 2016-04-09 ENCOUNTER — Other Ambulatory Visit: Payer: Self-pay | Admitting: Obstetrics & Gynecology

## 2016-04-14 ENCOUNTER — Encounter: Payer: Medicaid Other | Admitting: Student

## 2016-04-14 ENCOUNTER — Ambulatory Visit (HOSPITAL_COMMUNITY): Payer: Medicaid Other

## 2016-04-14 ENCOUNTER — Other Ambulatory Visit: Payer: Self-pay | Admitting: Family Medicine

## 2016-04-14 ENCOUNTER — Ambulatory Visit (HOSPITAL_COMMUNITY)
Admission: RE | Admit: 2016-04-14 | Discharge: 2016-04-14 | Disposition: A | Discharge status: Home / Self Care | Payer: Medicaid Other | Source: Ambulatory Visit | Attending: Family Medicine | Admitting: Family Medicine

## 2016-04-14 DIAGNOSIS — O09523 Supervision of elderly multigravida, third trimester: Secondary | ICD-10-CM | POA: Diagnosis present

## 2016-04-14 DIAGNOSIS — IMO0002 Reserved for concepts with insufficient information to code with codable children: Secondary | ICD-10-CM

## 2016-04-14 DIAGNOSIS — Z3492 Encounter for supervision of normal pregnancy, unspecified, second trimester: Secondary | ICD-10-CM

## 2016-04-14 DIAGNOSIS — Z36 Encounter for antenatal screening of mother: Secondary | ICD-10-CM | POA: Insufficient documentation

## 2016-04-14 DIAGNOSIS — Z3A31 31 weeks gestation of pregnancy: Secondary | ICD-10-CM | POA: Diagnosis not present

## 2016-04-14 DIAGNOSIS — Z0489 Encounter for examination and observation for other specified reasons: Secondary | ICD-10-CM

## 2016-04-22 ENCOUNTER — Encounter: Payer: Medicaid Other | Admitting: Family

## 2016-04-28 ENCOUNTER — Ambulatory Visit (INDEPENDENT_AMBULATORY_CARE_PROVIDER_SITE_OTHER): Payer: Medicaid Other | Admitting: Advanced Practice Midwife

## 2016-04-28 VITALS — BP 143/83 | HR 90 | Wt 183.4 lb

## 2016-04-28 DIAGNOSIS — Z23 Encounter for immunization: Secondary | ICD-10-CM | POA: Diagnosis not present

## 2016-04-28 DIAGNOSIS — Z3493 Encounter for supervision of normal pregnancy, unspecified, third trimester: Secondary | ICD-10-CM

## 2016-04-28 MED ORDER — TETANUS-DIPHTH-ACELL PERTUSSIS 5-2.5-18.5 LF-MCG/0.5 IM SUSP
0.5000 mL | Freq: Once | INTRAMUSCULAR | Status: AC
  Administered 2016-04-28: 0.5 mL via INTRAMUSCULAR

## 2016-04-28 NOTE — Progress Notes (Signed)
Subjective:  Maureen Simpson is a 40 y.o. G2P1001 at [redacted]w[redacted]d being seen today for ongoing prenatal care.  She is currently monitored for the following issues for this low-risk pregnancy and has ASC-cannot exclude HGSIL on Pap; Hypertension, postpartum condition or complication; Supervision of normal pregnancy in second trimester; and AMA (advanced maternal age) multigravida 35+ on her problem list.  Patient reports no complaints.  Contractions: Not present. Vag. Bleeding: None.  Movement: Present. Denies leaking of fluid.   The following portions of the patient's history were reviewed and updated as appropriate: allergies, current medications, past family history, past medical history, past social history, past surgical history and problem list. Problem list updated.  Objective:   Filed Vitals:   04/28/16 1541  BP: 143/83  Pulse: 90  Weight: 183 lb 6.4 oz (83.19 kg)    Fetal Status: Fetal Heart Rate (bpm): 149   Movement: Present     General:  Alert, oriented and cooperative. Patient is in no acute distress.  Skin: Skin is warm and dry. No rash noted.   Cardiovascular: Normal heart rate noted  Respiratory: Normal respiratory effort, no problems with respiration noted  Abdomen: Soft, gravid, appropriate for gestational age. Pain/Pressure: Present     Pelvic: Vag. Bleeding: None     Cervical exam deferred        Extremities: Normal range of motion.  Edema: Trace  Mental Status: Normal mood and affect. Normal behavior. Normal judgment and thought content.   Urinalysis:      Assessment and Plan:  Pregnancy: G2P1001 at [redacted]w[redacted]d  1. Supervision of low-risk pregnancy, third trimester      Discussed borderline elevation in SBP.  Patient states she is scared of blood draw.        Will watch for signs of preeclampsia or progressive hypertension - Glucose Tolerance, 1 HR (50g) w/o Fasting - RPR - HIV antibody (with reflex) - CBC  Preterm labor symptoms and general obstetric precautions  including but not limited to vaginal bleeding, contractions, leaking of fluid and fetal movement were reviewed in detail with the patient. Please refer to After Visit Summary for other counseling recommendations.  Return in about 2 weeks (around 05/12/2016) for [facility].   Seabron Spates, CNM

## 2016-04-28 NOTE — Patient Instructions (Signed)

## 2016-04-29 LAB — CBC
HCT: 32.8 % — ABNORMAL LOW (ref 35.0–45.0)
Hemoglobin: 11 g/dL — ABNORMAL LOW (ref 11.7–15.5)
MCH: 31.6 pg (ref 27.0–33.0)
MCHC: 33.5 g/dL (ref 32.0–36.0)
MCV: 94.3 fL (ref 80.0–100.0)
MPV: 9.2 fL (ref 7.5–12.5)
PLATELETS: 238 10*3/uL (ref 140–400)
RBC: 3.48 MIL/uL — ABNORMAL LOW (ref 3.80–5.10)
RDW: 13.8 % (ref 11.0–15.0)
WBC: 8.8 10*3/uL (ref 3.8–10.8)

## 2016-04-30 LAB — RPR

## 2016-04-30 LAB — HIV ANTIBODY (ROUTINE TESTING W REFLEX): HIV 1&2 Ab, 4th Generation: NONREACTIVE

## 2016-04-30 LAB — GLUCOSE TOLERANCE, 1 HOUR (50G) W/O FASTING: GLUCOSE, 1 HR, GESTATIONAL: 112 mg/dL (ref <140)

## 2016-05-11 ENCOUNTER — Ambulatory Visit (INDEPENDENT_AMBULATORY_CARE_PROVIDER_SITE_OTHER): Payer: Medicaid Other | Admitting: Student

## 2016-05-11 ENCOUNTER — Other Ambulatory Visit: Payer: Self-pay | Admitting: Obstetrics & Gynecology

## 2016-05-11 ENCOUNTER — Other Ambulatory Visit (HOSPITAL_COMMUNITY)
Admission: RE | Admit: 2016-05-11 | Discharge: 2016-05-11 | Disposition: A | Discharge status: Home / Self Care | Payer: Medicaid Other | Source: Ambulatory Visit | Attending: Student | Admitting: Student

## 2016-05-11 VITALS — BP 138/84 | HR 83 | Wt 183.3 lb

## 2016-05-11 DIAGNOSIS — Z3492 Encounter for supervision of normal pregnancy, unspecified, second trimester: Secondary | ICD-10-CM

## 2016-05-11 DIAGNOSIS — Z3483 Encounter for supervision of other normal pregnancy, third trimester: Secondary | ICD-10-CM

## 2016-05-11 DIAGNOSIS — Z113 Encounter for screening for infections with a predominantly sexual mode of transmission: Secondary | ICD-10-CM | POA: Diagnosis not present

## 2016-05-11 LAB — POCT URINALYSIS DIP (DEVICE)
BILIRUBIN URINE: NEGATIVE
Glucose, UA: NEGATIVE mg/dL
Hgb urine dipstick: NEGATIVE
Ketones, ur: NEGATIVE mg/dL
LEUKOCYTES UA: NEGATIVE
NITRITE: NEGATIVE
PH: 7 (ref 5.0–8.0)
Protein, ur: NEGATIVE mg/dL
Specific Gravity, Urine: 1.015 (ref 1.005–1.030)
UROBILINOGEN UA: 1 mg/dL (ref 0.0–1.0)

## 2016-05-11 LAB — OB RESULTS CONSOLE GC/CHLAMYDIA: GC PROBE AMP, GENITAL: NEGATIVE

## 2016-05-11 NOTE — Progress Notes (Signed)
Subjective:  Maureen Simpson is a 40 y.o. G2P1001 at [redacted]w[redacted]d being seen today for ongoing prenatal care.  She is currently monitored for the following issues for this high-risk pregnancy and has ASC-cannot exclude HGSIL on Pap; Hypertension, postpartum condition or complication; Supervision of normal pregnancy in second trimester; and AMA (advanced maternal age) multigravida 35+ on her problem list.  Patient reports no complaints.  Contractions: Not present. Vag. Bleeding: None.  Movement: Present. Denies leaking of fluid.   The following portions of the patient's history were reviewed and updated as appropriate: allergies, current medications, past family history, past medical history, past social history, past surgical history and problem list. Problem list updated.  Objective:   Filed Vitals:   05/11/16 1628  BP: 138/84  Pulse: 83  Weight: 183 lb 4.8 oz (83.144 kg)    Fetal Status: Fetal Heart Rate (bpm): 154 Fundal Height: 36 cm Movement: Present  Presentation: Vertex  General:  Alert, oriented and cooperative. Patient is in no acute distress.  Skin: Skin is warm and dry. No rash noted.   Cardiovascular: Normal heart rate noted  Respiratory: Normal respiratory effort, no problems with respiration noted  Abdomen: Soft, gravid, appropriate for gestational age. Pain/Pressure: Present     Pelvic: Vag. Bleeding: None     Cervical exam performed Dilation: Fingertip Effacement (%): 50 Station: -3  Extremities: Normal range of motion.  Edema: Trace  Mental Status: Normal mood and affect. Normal behavior. Normal judgment and thought content.   Urinalysis: Urine Protein: Negative Urine Glucose: Negative  Assessment and Plan:  Pregnancy: G2P1001 at [redacted]w[redacted]d  1. Supervision of normal pregnancy in second trimester  - Culture, beta strep (group b only) - GC/Chlamydia probe amp ([place])not at [facility]  PreTerm labor symptoms and general obstetric precautions including but not limited to  vaginal bleeding, contractions, leaking of fluid and fetal movement were reviewed in detail with the patient. Please refer to After Visit Summary for other counseling recommendations.  Return in about 2 weeks (around 05/25/2016) for Routine OB.   Jorje Guild, NP

## 2016-05-11 NOTE — Patient Instructions (Signed)
Braxton Hicks Contractions °Contractions of the uterus can occur throughout pregnancy. Contractions are not always a sign that you are in labor.  °WHAT ARE BRAXTON HICKS CONTRACTIONS?  °Contractions that occur before labor are called Braxton Hicks contractions, or false labor. Toward the end of pregnancy (32-34 weeks), these contractions can develop more often and may become more forceful. This is not true labor because these contractions do not result in opening (dilatation) and thinning of the cervix. They are sometimes difficult to tell apart from true labor because these contractions can be forceful and people have different pain tolerances. You should not feel embarrassed if you go to the hospital with false labor. Sometimes, the only way to tell if you are in true labor is for your health care provider to look for changes in the cervix. °If there are no prenatal problems or other health problems associated with the pregnancy, it is completely safe to be sent home with false labor and await the onset of true labor. °HOW CAN YOU TELL THE DIFFERENCE BETWEEN TRUE AND FALSE LABOR? °False Labor °· The contractions of false labor are usually shorter and not as hard as those of true labor.   °· The contractions are usually irregular.   °· The contractions are often felt in the front of the lower abdomen and in the groin.   °· The contractions may go away when you walk around or change positions while lying down.   °· The contractions get weaker and are shorter lasting as time goes on.   °· The contractions do not usually become progressively stronger, regular, and closer together as with true labor.   °True Labor °· Contractions in true labor last 30-70 seconds, become very regular, usually become more intense, and increase in frequency.   °· The contractions do not go away with walking.   °· The discomfort is usually felt in the top of the uterus and spreads to the lower abdomen and low back.   °· True labor can be  determined by your health care provider with an exam. This will show that the cervix is dilating and getting thinner.   °WHAT TO REMEMBER °· Keep up with your usual exercises and follow other instructions given by your health care provider.   °· Take medicines as directed by your health care provider.   °· Keep your regular prenatal appointments.   °· Eat and drink lightly if you think you are going into labor.   °· If Braxton Hicks contractions are making you uncomfortable:   °¨ Change your position from lying down or resting to walking, or from walking to resting.   °¨ Sit and rest in a tub of warm water.   °¨ Drink 2-3 glasses of water. Dehydration may cause these contractions.   °¨ Do slow and deep breathing several times an hour.   °WHEN SHOULD I SEEK IMMEDIATE MEDICAL CARE? °Seek immediate medical care if: °· Your contractions become stronger, more regular, and closer together.   °· You have fluid leaking or gushing from your vagina.   °· You have a fever.   °· You pass blood-tinged mucus.   °· You have vaginal bleeding.   °· You have continuous abdominal pain.   °· You have low back pain that you never had before.   °· You feel your baby's head pushing down and causing pelvic pressure.   °· Your baby is not moving as much as it used to.   °  °This information is not intended to replace advice given to you by your health care provider. Make sure you discuss any questions you have with your health care   provider. °  °Document Released: 12/14/2005 Document Revised: 12/19/2013 Document Reviewed: 09/25/2013 °Elsevier Interactive Patient Education ©2016 [employer] ° °

## 2016-05-11 NOTE — Progress Notes (Signed)
CUltures today

## 2016-05-13 LAB — GC/CHLAMYDIA PROBE AMP (~~LOC~~) NOT AT ARMC
CHLAMYDIA, DNA PROBE: NEGATIVE
NEISSERIA GONORRHEA: NEGATIVE

## 2016-05-14 LAB — CULTURE, BETA STREP (GROUP B ONLY)

## 2016-05-23 ENCOUNTER — Inpatient Hospital Stay (HOSPITAL_COMMUNITY)
Admission: AD | Admit: 2016-05-23 | Discharge: 2016-05-23 | Disposition: A | Discharge status: Home / Self Care | Payer: Medicaid Other | Source: Ambulatory Visit | Attending: Family Medicine | Admitting: Family Medicine

## 2016-05-23 ENCOUNTER — Encounter (HOSPITAL_COMMUNITY): Payer: Self-pay | Admitting: *Deleted

## 2016-05-23 DIAGNOSIS — Z87891 Personal history of nicotine dependence: Secondary | ICD-10-CM | POA: Insufficient documentation

## 2016-05-23 DIAGNOSIS — M549 Dorsalgia, unspecified: Secondary | ICD-10-CM | POA: Insufficient documentation

## 2016-05-23 DIAGNOSIS — Z3A36 36 weeks gestation of pregnancy: Secondary | ICD-10-CM | POA: Diagnosis not present

## 2016-05-23 DIAGNOSIS — T148XXA Other injury of unspecified body region, initial encounter: Secondary | ICD-10-CM

## 2016-05-23 DIAGNOSIS — O26893 Other specified pregnancy related conditions, third trimester: Secondary | ICD-10-CM | POA: Insufficient documentation

## 2016-05-23 DIAGNOSIS — O9989 Other specified diseases and conditions complicating pregnancy, childbirth and the puerperium: Secondary | ICD-10-CM | POA: Diagnosis not present

## 2016-05-23 DIAGNOSIS — M546 Pain in thoracic spine: Secondary | ICD-10-CM

## 2016-05-23 LAB — URINALYSIS, ROUTINE W REFLEX MICROSCOPIC
Bilirubin Urine: NEGATIVE
GLUCOSE, UA: NEGATIVE mg/dL
HGB URINE DIPSTICK: NEGATIVE
Ketones, ur: NEGATIVE mg/dL
LEUKOCYTES UA: NEGATIVE
Nitrite: NEGATIVE
PH: 6.5 (ref 5.0–8.0)
Protein, ur: NEGATIVE mg/dL

## 2016-05-23 MED ORDER — LIDOCAINE 5 % EX PTCH
1.0000 | MEDICATED_PATCH | Freq: Once | CUTANEOUS | Status: DC
  Administered 2016-05-23: 1 via TRANSDERMAL
  Filled 2016-05-23: qty 1

## 2016-05-23 MED ORDER — LIDOCAINE 5 % EX PTCH: 1.0000 | MEDICATED_PATCH | Freq: Once | CUTANEOUS | Status: DC

## 2016-05-23 MED ORDER — ACETAMINOPHEN 325 MG PO TABS
650.0000 mg | ORAL_TABLET | Freq: Once | ORAL | Status: AC
Start: 2016-05-23 — End: 2016-05-23
  Administered 2016-05-23: 650 mg via ORAL
  Filled 2016-05-23: qty 2

## 2016-05-23 NOTE — MAU Provider Note (Signed)
First Provider Initiated Contact with Patient 05/23/16 1335      Chief Complaint:  Back Pain   Maureen Simpson is  40 y.o. G2P1001 at [redacted]w[redacted]d presents complaining of Back Pain .She was at work (works 7 days a week, doesn't do much heavy lifting), and her upper left back started hurting . Denies any sudden strain.  Moving makes it worse.  Obstetrical/Gynecological History: OB History    Gravida Para Term Preterm AB TAB SAB Ectopic Multiple Living   2 1 1       1      Past Medical History: Past Medical History  Diagnosis Date  . Lipoma     right forearm  . No pertinent past medical history   . Asthma   . UTI (lower urinary tract infection)   . Abnormal Pap smear     during preg  . Vaginal Pap smear, abnormal     Past Surgical History: Past Surgical History  Procedure Laterality Date  . Lipoma excision  05/26/2012    Procedure: EXCISION LIPOMA;  Surgeon: Gayland Curry, MD,FACS;  Location: [place];  Service: General;  Laterality: Right;  excision of right proximal forearm lipoma  . Colposcopy      Family History: Family History  Problem Relation Age of Onset  . Hypertension Mother   . Hypertension Maternal Aunt   . Cancer Maternal Grandmother     colon     Social History: Social History  Substance Use Topics  . Smoking status: Former Smoker -- 0.25 packs/day    Types: Cigarettes  . Smokeless tobacco: Former Systems developer    Quit date: 12/08/2015  . Alcohol Use: Yes     Comment: SOCIAL    Allergies:  Allergies  Allergen Reactions  . Pollen Extract Other (See Comments)    Cold symptoms.    Meds:  Prescriptions prior to admission  Medication Sig Dispense Refill Last Dose  . acetaminophen (TYLENOL) 500 MG tablet Take 1,000 mg by mouth every 6 (six) hours as needed for mild pain. Reported on 05/11/2016   Not Taking  . cyclobenzaprine (FLEXERIL) 10 MG tablet Take 1 tablet (10 mg total) by mouth 3 (three) times daily as needed for muscle spasms. 30 tablet  2 Taking  . Prenatal Vit-Fe Fumarate-FA (PREPLUS) 27-1 MG TABS TAKE 1 TABLET BY MOUTH DAILY 30 tablet 0 Taking    Review of Systems   Constitutional: Negative for fever and chills Eyes: Negative for visual disturbances Respiratory: Negative for shortness of breath, dyspnea Cardiovascular: Negative for chest pain or palpitations  Gastrointestinal: Negative for vomiting, diarrhea and constipation Genitourinary: Negative for dysuria and urgency Musculoskeletal: POSITIVE for back pain.  Normal ROM  Neurological: Negative for dizziness and headaches    Physical Exam  Last menstrual period 10/27/2015. GENERAL: Well-developed, well-nourished female in no acute distress.  LUNGS: Clear to auscultation bilaterally.  HEART: Regular rate and rhythm. ABDOMEN: Soft, nontender, nondistended, gravid.  BACK:  No CVAT; tender area along left side of lower back EXTREMITIES: Nontender, no edema, 2+ distal pulses. DTR's 2+ FHT:  Baseline rate 145 bpm   Variability moderate  Accelerations present   Decelerations none Contractions: Every 0 mins   Labs: Results for orders placed or performed during the hospital encounter of 05/23/16 (from the past 24 hour(s))  Urinalysis, Routine w reflex microscopic (not at [facility])   Collection Time: 05/23/16  1:09 PM  Result Value Ref Range   Color, Urine YELLOW YELLOW   APPearance CLEAR CLEAR  Specific Gravity, Urine <1.005 (L) 1.005 - 1.030   pH 6.5 5.0 - 8.0   Glucose, UA NEGATIVE NEGATIVE mg/dL   Hgb urine dipstick NEGATIVE NEGATIVE   Bilirubin Urine NEGATIVE NEGATIVE   Ketones, ur NEGATIVE NEGATIVE mg/dL   Protein, ur NEGATIVE NEGATIVE mg/dL   Nitrite NEGATIVE NEGATIVE   Leukocytes, UA NEGATIVE NEGATIVE   Imaging Studies:  No results found.  Assessment: Maureen Simpson is  40 y.o. G2P1001 at [redacted]w[redacted]d presents with musculoskeletal back pain.  Plan: DC home Lidoderm patch, ice.  Declines work note  CRESENZO-DISHMAN,Nicolaos Mitrano 5/27/20171:52 PM

## 2016-05-23 NOTE — MAU Note (Signed)
At work lifted things at work noticed a pain in upper right back.  Pain did not get better, moving makes it worse

## 2016-05-23 NOTE — Discharge Instructions (Signed)

## 2016-06-10 ENCOUNTER — Ambulatory Visit (INDEPENDENT_AMBULATORY_CARE_PROVIDER_SITE_OTHER): Payer: Medicaid Other | Admitting: Family

## 2016-06-10 VITALS — BP 140/80 | HR 86 | Wt 188.0 lb

## 2016-06-10 DIAGNOSIS — Z3483 Encounter for supervision of other normal pregnancy, third trimester: Secondary | ICD-10-CM

## 2016-06-10 DIAGNOSIS — R87613 High grade squamous intraepithelial lesion on cytologic smear of cervix (HGSIL): Secondary | ICD-10-CM

## 2016-06-10 DIAGNOSIS — Z3493 Encounter for supervision of normal pregnancy, unspecified, third trimester: Secondary | ICD-10-CM

## 2016-06-10 NOTE — Progress Notes (Signed)
Urine results not available at discharge.

## 2016-06-10 NOTE — Progress Notes (Signed)
Subjective:  Maureen Simpson is a 40 y.o. G2P1001 at [redacted]w[redacted]d being seen today for ongoing prenatal care.  She is currently monitored for the following issues for this low-risk pregnancy and has Abnormal Pap smear of cervix; Supervision of normal pregnancy in second trimester; and AMA (advanced maternal age) multigravida 35+ on her problem list.  Patient reports no complaints.  Contractions: Not present. Vag. Bleeding: None.  Movement: Present. Denies leaking of fluid.   The following portions of the patient's history were reviewed and updated as appropriate: allergies, current medications, past family history, past medical history, past social history, past surgical history and problem list. Problem list updated.  Objective:   Filed Vitals:   06/10/16 0805  BP: 140/80  Pulse: 86  Weight: 188 lb (85.276 kg)    Fetal Status: Fetal Heart Rate (bpm): 150 Fundal Height: 39 cm Movement: Present     General:  Alert, oriented and cooperative. Patient is in no acute distress.  Skin: Skin is warm and dry. No rash noted.   Cardiovascular: Normal heart rate noted  Respiratory: Normal respiratory effort, no problems with respiration noted  Abdomen: Soft, gravid, appropriate for gestational age. Pain/Pressure: Absent     Pelvic: Vag. Bleeding: None     Cervical exam deferred        Extremities: Normal range of motion.  Edema: Trace  Mental Status: Normal mood and affect. Normal behavior. Normal judgment and thought content.   Urinalysis:     not available  Assessment and Plan:  Pregnancy: G2P1001 at [redacted]w[redacted]d  1. 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 Supervision of normal pregnancy in third trimester -GBS neg  Term labor symptoms and general obstetric precautions including but not limited to vaginal bleeding, contractions, leaking of fluid and fetal movement were reviewed in detail with the patient. Please refer to After Visit  Summary for other counseling recommendations.  Return in about 1 week (around 06/17/2016).   Julianne Handler, CNM

## 2016-06-16 ENCOUNTER — Telehealth: Payer: Self-pay | Admitting: *Deleted

## 2016-06-16 ENCOUNTER — Encounter: Payer: Medicaid Other | Admitting: Advanced Practice Midwife

## 2016-06-16 NOTE — Telephone Encounter (Signed)
Pt left message stating that she is 2 days past her due date and wants to know what to do. I returned pt's call and asked why she missed her appt today @ 0800. She stated that she is not feeling well- has cold like sx - stuffy nose, cough and sore throat. She is taking Tylenol cold medicine with some relief. I offered pt tomorrow @ 0730 and she accepted. Pt was also advised to drink 6-8 glasses of water daily.

## 2016-06-17 ENCOUNTER — Encounter (HOSPITAL_COMMUNITY): Payer: Self-pay | Admitting: *Deleted

## 2016-06-17 ENCOUNTER — Ambulatory Visit (INDEPENDENT_AMBULATORY_CARE_PROVIDER_SITE_OTHER): Payer: Medicaid Other | Admitting: Advanced Practice Midwife

## 2016-06-17 ENCOUNTER — Inpatient Hospital Stay (HOSPITAL_COMMUNITY)
Admission: AD | Admit: 2016-06-17 | Discharge: 2016-06-21 | DRG: 767 | Disposition: A | Discharge status: Home / Self Care | Payer: Medicaid Other | Source: Ambulatory Visit | Attending: Obstetrics and Gynecology | Admitting: Obstetrics and Gynecology

## 2016-06-17 VITALS — BP 145/87 | HR 84 | Wt 192.0 lb

## 2016-06-17 DIAGNOSIS — Z302 Encounter for sterilization: Secondary | ICD-10-CM

## 2016-06-17 DIAGNOSIS — Z87891 Personal history of nicotine dependence: Secondary | ICD-10-CM | POA: Diagnosis not present

## 2016-06-17 DIAGNOSIS — O480 Post-term pregnancy: Secondary | ICD-10-CM

## 2016-06-17 DIAGNOSIS — Z8249 Family history of ischemic heart disease and other diseases of the circulatory system: Secondary | ICD-10-CM | POA: Diagnosis not present

## 2016-06-17 DIAGNOSIS — O134 Gestational [pregnancy-induced] hypertension without significant proteinuria, complicating childbirth: Secondary | ICD-10-CM | POA: Diagnosis not present

## 2016-06-17 DIAGNOSIS — O133 Gestational [pregnancy-induced] hypertension without significant proteinuria, third trimester: Secondary | ICD-10-CM

## 2016-06-17 DIAGNOSIS — Z9851 Tubal ligation status: Secondary | ICD-10-CM

## 2016-06-17 DIAGNOSIS — I10 Essential (primary) hypertension: Secondary | ICD-10-CM | POA: Diagnosis present

## 2016-06-17 DIAGNOSIS — O139 Gestational [pregnancy-induced] hypertension without significant proteinuria, unspecified trimester: Secondary | ICD-10-CM

## 2016-06-17 DIAGNOSIS — O1404 Mild to moderate pre-eclampsia, complicating childbirth: Principal | ICD-10-CM | POA: Diagnosis present

## 2016-06-17 DIAGNOSIS — O163 Unspecified maternal hypertension, third trimester: Secondary | ICD-10-CM | POA: Diagnosis present

## 2016-06-17 DIAGNOSIS — Z3A40 40 weeks gestation of pregnancy: Secondary | ICD-10-CM

## 2016-06-17 DIAGNOSIS — Z36 Encounter for antenatal screening of mother: Secondary | ICD-10-CM | POA: Diagnosis not present

## 2016-06-17 HISTORY — DX: Essential (primary) hypertension: I10

## 2016-06-17 LAB — TYPE AND SCREEN
ABO/RH(D): A POS
Antibody Screen: NEGATIVE

## 2016-06-17 LAB — POCT URINALYSIS DIP (DEVICE)
Bilirubin Urine: NEGATIVE
GLUCOSE, UA: NEGATIVE mg/dL
Hgb urine dipstick: NEGATIVE
KETONES UR: NEGATIVE mg/dL
Leukocytes, UA: NEGATIVE
Nitrite: NEGATIVE
PROTEIN: NEGATIVE mg/dL
Specific Gravity, Urine: 1.01 (ref 1.005–1.030)
UROBILINOGEN UA: 0.2 mg/dL (ref 0.0–1.0)
pH: 7 (ref 5.0–8.0)

## 2016-06-17 LAB — CBC
HEMATOCRIT: 33.6 % — AB (ref 36.0–46.0)
HEMOGLOBIN: 11.7 g/dL — AB (ref 12.0–15.0)
MCH: 31.2 pg (ref 26.0–34.0)
MCHC: 34.8 g/dL (ref 30.0–36.0)
MCV: 89.6 fL (ref 78.0–100.0)
Platelets: 240 10*3/uL (ref 150–400)
RBC: 3.75 MIL/uL — ABNORMAL LOW (ref 3.87–5.11)
RDW: 14.4 % (ref 11.5–15.5)
WBC: 7.3 10*3/uL (ref 4.0–10.5)

## 2016-06-17 LAB — PROTEIN / CREATININE RATIO, URINE
CREATININE, URINE: 55 mg/dL
Protein Creatinine Ratio: 0.25 mg/mg{Cre} — ABNORMAL HIGH (ref 0.00–0.15)
Total Protein, Urine: 14 mg/dL

## 2016-06-17 LAB — OB RESULTS CONSOLE GBS: GBS: NEGATIVE

## 2016-06-17 MED ORDER — OXYTOCIN BOLUS FROM INFUSION: 500.0000 mL | INTRAVENOUS | Status: DC

## 2016-06-17 MED ORDER — ONDANSETRON HCL 4 MG/2ML IJ SOLN
4.0000 mg | Freq: Four times a day (QID) | INTRAMUSCULAR | Status: DC | PRN
  Administered 2016-06-18: 4 mg via INTRAVENOUS
  Filled 2016-06-17: qty 2

## 2016-06-17 MED ORDER — SOD CITRATE-CITRIC ACID 500-334 MG/5ML PO SOLN: 30.0000 mL | ORAL | Status: DC | PRN

## 2016-06-17 MED ORDER — OXYCODONE-ACETAMINOPHEN 5-325 MG PO TABS: 1.0000 | ORAL_TABLET | ORAL | Status: DC | PRN

## 2016-06-17 MED ORDER — FENTANYL CITRATE (PF) 100 MCG/2ML IJ SOLN
100.0000 ug | INTRAMUSCULAR | Status: DC | PRN
  Administered 2016-06-18 (×3): 100 ug via INTRAVENOUS
  Filled 2016-06-17 (×3): qty 2

## 2016-06-17 MED ORDER — TERBUTALINE SULFATE 1 MG/ML IJ SOLN
0.2500 mg | Freq: Once | INTRAMUSCULAR | Status: DC | PRN
Start: 2016-06-17 — End: 2016-06-19
  Filled 2016-06-17: qty 1

## 2016-06-17 MED ORDER — LACTATED RINGERS IV SOLN: 500.0000 mL | INTRAVENOUS | Status: DC | PRN

## 2016-06-17 MED ORDER — MISOPROSTOL 25 MCG QUARTER TABLET
25.0000 ug | ORAL_TABLET | ORAL | Status: DC | PRN
  Administered 2016-06-17 – 2016-06-18 (×5): 25 ug via VAGINAL
  Filled 2016-06-17: qty 1
  Filled 2016-06-17 (×5): qty 0.25

## 2016-06-17 MED ORDER — ZOLPIDEM TARTRATE 5 MG PO TABS
5.0000 mg | ORAL_TABLET | Freq: Every evening | ORAL | Status: DC | PRN
  Administered 2016-06-18: 5 mg via ORAL
  Filled 2016-06-17: qty 1

## 2016-06-17 MED ORDER — LACTATED RINGERS IV SOLN
INTRAVENOUS | Status: DC
  Administered 2016-06-17 – 2016-06-18 (×3): via INTRAVENOUS

## 2016-06-17 MED ORDER — ACETAMINOPHEN 325 MG PO TABS: 650.0000 mg | ORAL_TABLET | ORAL | Status: DC | PRN

## 2016-06-17 MED ORDER — OXYCODONE-ACETAMINOPHEN 5-325 MG PO TABS: 2.0000 | ORAL_TABLET | ORAL | Status: DC | PRN

## 2016-06-17 MED ORDER — OXYTOCIN 40 UNITS IN LACTATED RINGERS INFUSION - SIMPLE MED: 2.5000 [IU]/h | INTRAVENOUS | Status: DC

## 2016-06-17 MED ORDER — LIDOCAINE HCL (PF) 1 % IJ SOLN
30.0000 mL | INTRAMUSCULAR | Status: DC | PRN
  Filled 2016-06-17: qty 30

## 2016-06-17 MED ORDER — BUTORPHANOL TARTRATE 1 MG/ML IJ SOLN
1.0000 mg | Freq: Once | INTRAMUSCULAR | Status: AC
Start: 2016-06-17 — End: 2016-06-17
  Administered 2016-06-17: 1 mg via INTRAVENOUS
  Filled 2016-06-17: qty 1

## 2016-06-17 NOTE — H&P (Signed)
OBSTETRIC ADMISSION HISTORY AND PHYSICAL  Maureen Simpson is a 40 y.o. female G2P1001 with IUP at [redacted]w[redacted]d by 12wk presenting for IOL due to gestational hypertension. She reports +FMs, No LOF, no VB, no blurry vision, headaches or peripheral edema, and RUQ pain.  She plans on formula feeding. She request BTL for birth control.  Dating: By Maureen Simpson --->  Estimated Date of Delivery: 06/15/16  Prenatal History/Complications:  Past Medical History: Past Medical History  Diagnosis Date  . Lipoma     right forearm  . No pertinent past medical history   . Asthma   . UTI (lower urinary tract infection)   . Abnormal Pap smear     during preg  . Vaginal Pap smear, abnormal     Past Surgical History: Past Surgical History  Procedure Laterality Date  . Lipoma excision  05/26/2012    Procedure: EXCISION LIPOMA;  Surgeon: Gayland Curry, MD,FACS;  Location: [facility];  Service: General;  Laterality: Right;  excision of right proximal forearm lipoma  . Colposcopy      Obstetrical History: OB History    Gravida Para Term Preterm AB TAB SAB Ectopic Multiple Living   2 1 1       1       Social History: Social History   Social History  . Marital Status: Single    Spouse Name: N/A  . Number of Children: N/A  . Years of Education: N/A   Social History Main Topics  . Smoking status: Former Smoker -- 0.25 packs/day    Types: Cigarettes  . Smokeless tobacco: Former Systems developer    Quit date: 12/08/2015  . Alcohol Use: Yes     Comment: SOCIAL  . Drug Use: No  . Sexual Activity: Not Currently    Birth Control/ Protection: None     Comment: getting Mirena   Other Topics Concern  . None   Social History Narrative    Family History: Family History  Problem Relation Age of Onset  . Hypertension Mother   . Hypertension Maternal Aunt   . Cancer Maternal Grandmother     colon     Allergies: Allergies  Allergen Reactions  . Pollen Extract Other (See Comments)    Cold  symptoms.    Prescriptions prior to admission  Medication Sig Dispense Refill Last Dose  . Prenatal Vit-Fe Fumarate-FA (PREPLUS) 27-1 MG TABS TAKE 1 TABLET BY MOUTH DAILY 30 tablet 0 Taking     Review of Systems   All systems reviewed and negative except as stated in HPI  Blood pressure 147/59, pulse 86, temperature 98.5 F (36.9 C), temperature source Oral, resp. rate 18, height 5\' 3"  (1.6 m), weight 187 lb (84.823 kg), last menstrual period 10/27/2015. General appearance: alert, cooperative and appears stated age Lungs: clear to auscultation bilaterally Heart: regular rate and rhythm Abdomen: soft, non-tender; bowel sounds normal Pelvic: adequate Extremities: Homans sign is negative, no sign of DVT DTR's wnl. No hyperreflexia Presentation: cephalic Fetal monitoringBaseline: 145 bpm, Variability: Good {> 6 bpm), Accelerations: Reactive and Decelerations: Absent Uterine activityNone     Prenatal labs: ABO, Rh: A/POS/-- (01/17 1540) Antibody: NEG (01/17 1540) Rubella: 7.36 (01/17 1540) RPR: NON REAC (05/02 1640)  HBsAg: NEGATIVE (01/17 1540)  HIV: NONREACTIVE (05/02 1640)  GBS:    1 hr Glucola 112 Genetic screening  Quad Neg Anatomy US wnl  Prenatal Transfer Tool  Maternal Diabetes: No Genetic Screening: Normal Maternal Ultrasounds/Referrals: Normal Fetal Ultrasounds or other Referrals:  None  Maternal Substance Abuse:  No Significant Maternal Medications:  None Significant Maternal Lab Results: Lab values include: Group B Strep negative  Results for orders placed or performed in visit on 06/17/16 (from the past 24 hour(s))  POCT urinalysis dip (device)   Collection Time: 06/17/16  7:57 AM  Result Value Ref Range   Glucose, UA NEGATIVE NEGATIVE mg/dL   Bilirubin Urine NEGATIVE NEGATIVE   Ketones, ur NEGATIVE NEGATIVE mg/dL   Specific Gravity, Urine 1.010 1.005 - 1.030   Hgb urine dipstick NEGATIVE NEGATIVE   pH 7.0 5.0 - 8.0   Protein, ur NEGATIVE NEGATIVE  mg/dL   Urobilinogen, UA 0.2 0.0 - 1.0 mg/dL   Nitrite NEGATIVE NEGATIVE   Leukocytes, UA NEGATIVE NEGATIVE    Patient Active Problem List   Diagnosis Date Noted  . Gestational hypertension 06/17/2016  . AMA (advanced maternal age) multigravida 35+ 02/11/2016  . Supervision of normal pregnancy in third trimester 01/14/2016  . Abnormal Pap smear of cervix 05/05/2013    Assessment/Plan:  Maureen Simpson is a 40 y.o. G2P1001 at [redacted]w[redacted]d here for IOL for gestational HTN  #Labor: Cytotec q4 hours. Confirmed vtx by [place], infant is OP #Pain: prn #FWB: Cat I #ID:  GBS neg #MOF: bottle #MOC:BTL, pp #Circ:  female  #Gestational HTN: No sx currently and mild range BP.  Preeclampsia labs pending  Caren Macadam, MD  06/17/2016, 12:28 PM

## 2016-06-17 NOTE — Anesthesia Pain Management Evaluation Note (Signed)
  CRNA Pain Management Visit Note  Patient: Maureen Simpson, 40 y.o., female  "Hello I am a member of the anesthesia team at [facility]. We have an anesthesia team available at all times to provide care throughout the hospital, including epidural management and anesthesia for C-section. I don't know your plan for the delivery whether it a natural birth, water birth, IV sedation, nitrous supplementation, doula or epidural, but we want to meet your pain goals."   1.Was your pain managed to your expectations on prior hospitalizations?   Yes   2.What is your expectation for pain management during this hospitalization?     Epidural  3.How can we help you reach that goal? epidural  Record the patient's initial score and the patient's pain goal.   Pain: 0  Pain Goal: 3 The [facility] wants you to be able to say your pain was always managed very well.  Willa Rough 06/17/2016

## 2016-06-17 NOTE — Progress Notes (Signed)
Subjective:  Maureen Simpson is a 40 y.o. G2P1001 at [redacted]w[redacted]d being seen today for ongoing prenatal care.  She is currently monitored for the following issues for this low-risk pregnancy and has Abnormal Pap smear of cervix; Supervision of normal pregnancy in third trimester; and AMA (advanced maternal age) multigravida 35+ on her problem list.  Patient reports occasional contractions.  Denies HA, vision changes or epigastric pain. Contractions: Irritability. Vag. Bleeding: None.  Movement: Present. Denies leaking of fluid.   The following portions of the patient's history were reviewed and updated as appropriate: allergies, current medications, past family history, past medical history, past social history, past surgical history and problem list. Problem list updated.  Objective:   Filed Vitals:   06/17/16 0803 06/17/16 0815  BP: 149/82 145/87  Pulse: 84   Weight: 192 lb (87.091 kg)     Fetal Status:   NST reactive  Movement: Present  Presentation: Vertex AFI >2 cm SFP General:  Alert, oriented and cooperative. Patient is in no acute distress.  Skin: Skin is warm and dry. No rash noted.   Cardiovascular: Normal heart rate noted  Respiratory: Normal respiratory effort, no problems with respiration noted  Abdomen: Soft, gravid, appropriate for gestational age. Pain/Pressure: Present     Pelvic: Cervical exam deferred        Extremities: Normal range of motion.  Edema: Trace  Mental Status: Normal mood and affect. Normal behavior. Normal judgment and thought content.   Urinalysis: Urine Protein: Negative Urine Glucose: Negative  Assessment and Plan:  Pregnancy: G2P1001 at [redacted]w[redacted]d  1. Post-term pregnancy, 40-42 weeks of gestation  - Fetal nonstress test - Amniotic fluid index  2. Gestational hypertension w/o significant proteinuria in 3rd trimester  - Amniotic fluid index  Term labor symptoms and general obstetric precautions including but not limited to vaginal bleeding,  contractions, leaking of fluid and fetal movement were reviewed in detail with the patient. Recommend IOL for GHTN. Needs pre-E labs. Pt will get belongings and be directly admitted to L&D. Return in about 6 weeks (around 07/29/2016) for Postpartum visit.   Manya Silvas, CNM

## 2016-06-17 NOTE — Progress Notes (Signed)
Labor Progress Note Maureen Simpson is a 40 y.o. G2P1001 at [redacted]w[redacted]d presented for IOL for gestational hypertension. S: reports some contraction but not strong. Denies headache, vision changes, RUQ pain.   O:  BP 141/90 mmHg  Pulse 77  Temp(Src) 98.5 F (36.9 C) (Oral)  Resp 18  Ht 5\' 3"  (1.6 m)  Wt 84.823 kg (187 lb)  BMI 33.13 kg/m2  LMP 10/27/2015 (Approximate) EFM: 150/mod var/no decels  CVE: Dilation: 1 Effacement (%): 60 Cervical Position: Posterior Station: -3 Presentation: Vertex Exam by:: K Mcleod RN   A&P: 40 y.o. G2P1001 [redacted]w[redacted]d was admitted for IOL for gestational hypertension.  #Gestational hypertension: normal to mild range. No severe features. AST slightly elevated. Plts normal. UPC 0.25. Will continue to monitor BP.  #Labor: IOL with cytotec. Hard to place foley at this time. #Pain: gave stadol once after painful exam #FWB: Cat 1 #GBS: Negative.  Marya Amsler, Student-PA 5:50 PM

## 2016-06-17 NOTE — Patient Instructions (Signed)

## 2016-06-17 NOTE — Progress Notes (Signed)
Patient ID: Maureen Simpson, female   DOB: 1976/11/04, 40 y.o.   MRN: [id number]  Feels well; some cramping BPs 150s/70-80s, none in severe range; other VSS FHR 140s, +accels, no decels Ctx irreg Cx 1/thick/-3  IUP@term  gHTN Cx unfavorable  Cytotec #3 placed Anticipate continuing cytotec until foley is able to be placed  Serita Grammes CNM 06/17/2016 11:19 PM

## 2016-06-18 ENCOUNTER — Inpatient Hospital Stay (HOSPITAL_COMMUNITY): Payer: Medicaid Other | Admitting: Anesthesiology

## 2016-06-18 LAB — COMPREHENSIVE METABOLIC PANEL
ALT: 26 U/L (ref 14–54)
ANION GAP: 7 (ref 5–15)
AST: 45 U/L — ABNORMAL HIGH (ref 15–41)
Albumin: 3 g/dL — ABNORMAL LOW (ref 3.5–5.0)
Alkaline Phosphatase: 194 U/L — ABNORMAL HIGH (ref 38–126)
BUN: <5 mg/dL — ABNORMAL LOW (ref 6–20)
CHLORIDE: 104 mmol/L (ref 101–111)
CO2: 25 mmol/L (ref 22–32)
CREATININE: 0.42 mg/dL — AB (ref 0.44–1.00)
Calcium: 8.2 mg/dL — ABNORMAL LOW (ref 8.9–10.3)
Glucose, Bld: 85 mg/dL (ref 65–99)
Potassium: 2.7 mmol/L — CL (ref 3.5–5.1)
SODIUM: 136 mmol/L (ref 135–145)
Total Bilirubin: 0.7 mg/dL (ref 0.3–1.2)
Total Protein: 6.2 g/dL — ABNORMAL LOW (ref 6.5–8.1)

## 2016-06-18 LAB — BASIC METABOLIC PANEL
Anion gap: 11 (ref 5–15)
BUN: 8 mg/dL (ref 6–20)
CHLORIDE: 102 mmol/L (ref 101–111)
CO2: 20 mmol/L — ABNORMAL LOW (ref 22–32)
Calcium: 7.9 mg/dL — ABNORMAL LOW (ref 8.9–10.3)
Creatinine, Ser: 1.05 mg/dL — ABNORMAL HIGH (ref 0.44–1.00)
GFR calc non Af Amer: >60 mL/min (ref >60)
Glucose, Bld: 104 mg/dL — ABNORMAL HIGH (ref 65–99)
POTASSIUM: 2.8 mmol/L — AB (ref 3.5–5.1)
SODIUM: 133 mmol/L — AB (ref 135–145)

## 2016-06-18 LAB — CBC
HEMATOCRIT: 34.4 % — AB (ref 36.0–46.0)
Hemoglobin: 12 g/dL (ref 12.0–15.0)
MCH: 31.2 pg (ref 26.0–34.0)
MCHC: 34.9 g/dL (ref 30.0–36.0)
MCV: 89.4 fL (ref 78.0–100.0)
PLATELETS: 229 10*3/uL (ref 150–400)
RBC: 3.85 MIL/uL — ABNORMAL LOW (ref 3.87–5.11)
RDW: 14.4 % (ref 11.5–15.5)
WBC: 9.4 10*3/uL (ref 4.0–10.5)

## 2016-06-18 LAB — PROTEIN / CREATININE RATIO, URINE
CREATININE, URINE: 87 mg/dL
PROTEIN CREATININE RATIO: 0.39 mg/mg{creat} — AB (ref 0.00–0.15)
TOTAL PROTEIN, URINE: 34 mg/dL

## 2016-06-18 LAB — RPR: RPR Ser Ql: NONREACTIVE

## 2016-06-18 LAB — MAGNESIUM
MAGNESIUM: 2 mg/dL (ref 1.7–2.4)
Magnesium: 1.5 mg/dL — ABNORMAL LOW (ref 1.7–2.4)

## 2016-06-18 MED ORDER — MAGNESIUM SULFATE 2 GM/50ML IV SOLN
2.0000 g | Freq: Once | INTRAVENOUS | Status: AC
  Administered 2016-06-18: 2 g via INTRAVENOUS
  Filled 2016-06-18: qty 50

## 2016-06-18 MED ORDER — LACTATED RINGERS IV SOLN
500.0000 mL | Freq: Once | INTRAVENOUS | Status: AC
  Administered 2016-06-18: 500 mL via INTRAVENOUS

## 2016-06-18 MED ORDER — OXYTOCIN 40 UNITS IN LACTATED RINGERS INFUSION - SIMPLE MED
1.0000 m[IU]/min | INTRAVENOUS | Status: DC
  Administered 2016-06-18: 16 m[IU]/min via INTRAVENOUS
  Administered 2016-06-18: 18 m[IU]/min via INTRAVENOUS
  Administered 2016-06-18: 14 m[IU]/min via INTRAVENOUS
  Administered 2016-06-18: 22 m[IU]/min via INTRAVENOUS
  Administered 2016-06-18: 12 m[IU]/min via INTRAVENOUS
  Administered 2016-06-18: 20 m[IU]/min via INTRAVENOUS
  Administered 2016-06-18: 2 m[IU]/min via INTRAVENOUS
  Filled 2016-06-18: qty 1000

## 2016-06-18 MED ORDER — POTASSIUM CHLORIDE CRYS ER 20 MEQ PO TBCR
40.0000 meq | EXTENDED_RELEASE_TABLET | Freq: Once | ORAL | Status: AC
  Administered 2016-06-18: 40 meq via ORAL
  Filled 2016-06-18: qty 2

## 2016-06-18 MED ORDER — EPHEDRINE 5 MG/ML INJ
10.0000 mg | INTRAVENOUS | Status: DC | PRN
  Filled 2016-06-18: qty 2

## 2016-06-18 MED ORDER — LIDOCAINE HCL (PF) 1 % IJ SOLN
INTRAMUSCULAR | Status: DC | PRN
Start: 2016-06-18 — End: 2016-06-19
  Administered 2016-06-18 (×2): 5 mL

## 2016-06-18 MED ORDER — FENTANYL 2.5 MCG/ML BUPIVACAINE 1/10 % EPIDURAL INFUSION (WH - ANES)
14.0000 mL/h | INTRAMUSCULAR | Status: DC | PRN
  Administered 2016-06-18 (×2): 14 mL/h via EPIDURAL
  Filled 2016-06-18 (×2): qty 125

## 2016-06-18 MED ORDER — KCL IN DEXTROSE-NACL 40-5-0.45 MEQ/L-%-% IV SOLN
INTRAVENOUS | Status: DC
  Administered 2016-06-18: 17:00:00 via INTRAVENOUS
  Filled 2016-06-18 (×3): qty 1000

## 2016-06-18 MED ORDER — PHENYLEPHRINE 40 MCG/ML (10ML) SYRINGE FOR IV PUSH (FOR BLOOD PRESSURE SUPPORT)
80.0000 ug | PREFILLED_SYRINGE | INTRAVENOUS | Status: DC | PRN
  Filled 2016-06-18: qty 5

## 2016-06-18 MED ORDER — DIPHENHYDRAMINE HCL 50 MG/ML IJ SOLN: 12.5000 mg | INTRAMUSCULAR | Status: DC | PRN

## 2016-06-18 MED ORDER — PHENYLEPHRINE 40 MCG/ML (10ML) SYRINGE FOR IV PUSH (FOR BLOOD PRESSURE SUPPORT)
80.0000 ug | PREFILLED_SYRINGE | INTRAVENOUS | Status: DC | PRN
  Filled 2016-06-18: qty 5
  Filled 2016-06-18: qty 10

## 2016-06-18 MED ORDER — TERBUTALINE SULFATE 1 MG/ML IJ SOLN
0.2500 mg | Freq: Once | INTRAMUSCULAR | Status: DC | PRN
  Filled 2016-06-18: qty 1

## 2016-06-18 NOTE — Progress Notes (Signed)
Labor Progress Note Maureen Simpson is a 40 y.o. G2P1001 at [redacted]w[redacted]d presented for IOL for gestational hypertension. S: reports feeling pressure in her pelvis. Denies headache, vision changes, RUQ pain or difficulty breathing.   O:  BP 141/126 mmHg  Pulse 75  Temp(Src) 99 F (37.2 C) (Oral)  Resp 18  Ht 5\' 3"  (1.6 m)  Wt 187 lb (84.823 kg)  BMI 33.13 kg/m2  SpO2 100%  LMP 10/27/2015 (Approximate) EFM: 150/mod var/ occasional variabl decels  CVE: Dilation: Lip/rim Effacement (%): 90 Cervical Position: Posterior Station: 0 Presentation: Vertex Exam by:: Carter Kitten RNC   A&P: 40 y.o. G2P1001 [redacted]w[redacted]d was admitted for IOL for gestational hypertension.  #Pre-eclampsia: mild to severe range BPs. No severe features. UPC 0.39  -Continue Mg  -Will discuss about BP meds #Labor: progressing well. continue pitocin.  #Pain: well controlled with epidural  #FWB: Cat 2 but with good variability and accels #GBS: Negative.  Mercy Riding, MD 7:45 PM

## 2016-06-18 NOTE — Progress Notes (Signed)
Labor Progress Note Maureen Simpson is a 40 y.o. G2P1001 at [redacted]w[redacted]d presented for IOL for gestational hypertension. S: Patient lying on her bed comfortably. Reports very mild contractions far apart. Denies headache, vision changes, RUQ pain or difficulty breathing.   O:  BP 140/80 mmHg  Pulse 60  Temp(Src) 98.2 F (36.8 C) (Oral)  Resp 16  Ht 5\' 3"  (1.6 m)  Wt 187 lb (84.823 kg)  BMI 33.13 kg/m2  LMP 10/27/2015 (Approximate) EFM: 130/mod var/no decels  CVE: Dilation: 1 Effacement (%): Thick Cervical Position: Posterior Station: -3 Presentation: Vertex Exam by:: Carter Kitten RNC   A&P: 40 y.o. G2P1001 [redacted]w[redacted]d was admitted for IOL for gestational hypertension.  #GHTN: mild range blood pressures. No severe features. AST 52 on admission. Plts normal. UPC 0.25.   -Will continue to monitor.  #Labor: status post Cytotec x5. Attempt to place FB twice (yesterday and last night) not successful #Pain: gave stadol once after painful exam #FWB: Cat 1 #GBS: Negative.  Mercy Riding, MD 12:07 PM

## 2016-06-18 NOTE — Progress Notes (Signed)
Attempt to push through lip unsuccessful.  Repositioned pt to left side from right side.

## 2016-06-18 NOTE — Progress Notes (Signed)
LABOR PROGRESS NOTE  Maureen Simpson is a 40 y.o. G2P1001 at [redacted]w[redacted]d  admitted for iol for ghtn.  Subjective: Moderate pain w/ contractions  Objective: BP 140/80 mmHg  Pulse 60  Temp(Src) 98.2 F (36.8 C) (Oral)  Resp 16  Ht 5\' 3"  (1.6 m)  Wt 187 lb (84.823 kg)  BMI 33.13 kg/m2  LMP 10/27/2015 (Approximate) or  Filed Vitals:   06/18/16 0340 06/18/16 0600 06/18/16 0815 06/18/16 1000  BP: 156/87  132/108 140/80  Pulse: 67  59 60  Temp: 98.3 F (36.8 C)  98.2 F (36.8 C)   TempSrc: Oral  Oral   Resp: 18 18 18 16   Height:      Weight:        145/mod/+a/-d  Dilation: 1.5 Effacement (%): 60 Cervical Position: Posterior Station: -2 Presentation: Vertex Exam by:: [id number]  Labs: Lab Results  Component Value Date   WBC 7.3 06/17/2016   HGB 11.7* 06/17/2016   HCT 33.6* 06/17/2016   MCV 89.6 06/17/2016   PLT 240 06/17/2016    Patient Active Problem List   Diagnosis Date Noted  . Gestational hypertension 06/17/2016  . AMA (advanced maternal age) multigravida 35+ 02/11/2016  . Supervision of normal pregnancy in third trimester 01/14/2016  . Abnormal Pap smear of cervix 05/05/2013    Assessment / Plan: 40 y.o. G2P1001 at [redacted]w[redacted]d here for iol for ghtn  Labor: just now s/p arom mec, will start pitocin Fetal Wellbeing:  Cat 1 Pain Control:  Iv narcotics for the time being, eventually epidural Anticipated MOD:  Vaginal Ghtn: no severe-range BPs or signs/symptoms severe disease  Desma Maxim, MD 06/18/2016, 12:14 PM

## 2016-06-18 NOTE — Anesthesia Procedure Notes (Signed)
Epidural Patient location during procedure: OB  Staffing Anesthesiologist: Danaija Eskridge Performed by: anesthesiologist   Preanesthetic Checklist Completed: patient identified, site marked, surgical consent, pre-op evaluation, timeout performed, IV checked, risks and benefits discussed and monitors and equipment checked  Epidural Patient position: sitting Prep: DuraPrep Patient monitoring: heart rate, continuous pulse ox and blood pressure Approach: right paramedian Location: L3-L4 Injection technique: LOR saline  Needle:  Needle type: Tuohy  Needle gauge: 17 G Needle length: 9 cm and 9 Needle insertion depth: 5 cm Catheter type: closed end flexible Catheter size: 20 Guage Catheter at skin depth: 10 cm Test dose: negative  Assessment Events: blood not aspirated, injection not painful, no injection resistance, negative IV test and no paresthesia  Additional Notes Patient identified. Risks/Benefits/Options discussed with patient including but not limited to bleeding, infection, nerve damage, paralysis, failed block, incomplete pain control, headache, blood pressure changes, nausea, vomiting, reactions to medication both or allergic, itching and postpartum back pain. Confirmed with bedside nurse the patient's most recent platelet count. Confirmed with patient that they are not currently taking any anticoagulation, have any bleeding history or any family history of bleeding disorders. Patient expressed understanding and wished to proceed. All questions were answered. Sterile technique was used throughout the entire procedure. Please see nursing notes for vital signs. Test dose was given through epidural needle and negative prior to continuing to dose epidural or start infusion. Warning signs of high block given to the patient including shortness of breath, tingling/numbness in hands, complete motor block, or any concerning symptoms with instructions to call for help. Patient was given  instructions on fall risk and not to get out of bed. All questions and concerns addressed with instructions to call with any issues.   

## 2016-06-18 NOTE — Anesthesia Pain Management Evaluation Note (Signed)
  CRNA Pain Management Visit Note  Patient: Maureen Simpson, 40 y.o., female  "Hello I am a member of the anesthesia team at [facility]. We have an anesthesia team available at all times to provide care throughout the hospital, including epidural management and anesthesia for C-section. I don't know your plan for the delivery whether it a natural birth, water birth, IV sedation, nitrous supplementation, doula or epidural, but we want to meet your pain goals."   1.Was your pain managed to your expectations on prior hospitalizations?   No prior hospitalizations  2.What is your expectation for pain management during this hospitalization?     Epidural  3.How can we help you reach that goal? IV medication, Epidural  Record the patient's initial score and the patient's pain goal.   Pain: 7, Patient is not contracting; heavy pressure main complaint, IV med supplementation from RN has been given; patient appears very relaxed/comfortable  Pain Goal: 4 The [facility] wants you to be able to say your pain was always managed very well.  [facility] 06/18/2016

## 2016-06-18 NOTE — Anesthesia Pain Management Evaluation Note (Signed)
  CRNA Pain Management Visit Note  Patient: Maureen Simpson, 40 y.o., female  "Hello I am a member of the anesthesia team at [facility]. We have an anesthesia team available at all times to provide care throughout the hospital, including epidural management and anesthesia for C-section. I don't know your plan for the delivery whether it a natural birth, water birth, IV sedation, nitrous supplementation, doula or epidural, but we want to meet your pain goals."   1.Was your pain managed to your expectations on prior hospitalizations?   No prior hospitalizations  2.What is your expectation for pain management during this hospitalization?     Epidural  3.How can we help you reach that goal?Waiting for epidural placement now  Record the patient's initial score and the patient's pain goal.   Pain: 10  Pain Goal: 3 The [facility] wants you to be able to say your pain was always managed very well.  [facility] 06/18/2016

## 2016-06-18 NOTE — Anesthesia Preprocedure Evaluation (Signed)
Anesthesia Evaluation  Patient identified by MRN, date of birth, ID band Patient awake    Reviewed: Allergy & Precautions, H&P , NPO status , Patient's Chart, lab work & pertinent test results  History of Anesthesia Complications Negative for: history of anesthetic complications  Airway Mallampati: II  TM Distance: >3 FB Neck ROM: full    Dental no notable dental hx. (+) Teeth Intact   Pulmonary neg pulmonary ROS, former smoker,    Pulmonary exam normal breath sounds clear to auscultation       Cardiovascular negative cardio ROS Normal cardiovascular exam Rhythm:regular Rate:Normal     Neuro/Psych negative neurological ROS  negative psych ROS   GI/Hepatic negative GI ROS, Neg liver ROS,   Endo/Other  negative endocrine ROS  Renal/GU negative Renal ROS  negative genitourinary   Musculoskeletal   Abdominal   Peds  Hematology negative hematology ROS (+)   Anesthesia Other Findings   Reproductive/Obstetrics (+) Pregnancy                             Anesthesia Physical Anesthesia Plan  ASA: II  Anesthesia Plan: Epidural   Post-op Pain Management:    Induction:   Airway Management Planned:   Additional Equipment:   Intra-op Plan:   Post-operative Plan:   Informed Consent: I have reviewed the patients History and Physical, chart, labs and discussed the procedure including the risks, benefits and alternatives for the proposed anesthesia with the patient or authorized representative who has indicated his/her understanding and acceptance.     Plan Discussed with:   Anesthesia Plan Comments:         Anesthesia Quick Evaluation

## 2016-06-19 ENCOUNTER — Encounter (HOSPITAL_COMMUNITY): Payer: Self-pay | Admitting: *Deleted

## 2016-06-19 DIAGNOSIS — O134 Gestational [pregnancy-induced] hypertension without significant proteinuria, complicating childbirth: Secondary | ICD-10-CM

## 2016-06-19 DIAGNOSIS — Z3A40 40 weeks gestation of pregnancy: Secondary | ICD-10-CM

## 2016-06-19 LAB — BASIC METABOLIC PANEL
ANION GAP: 4 — AB (ref 5–15)
BUN: 6 mg/dL (ref 6–20)
CALCIUM: 8.2 mg/dL — AB (ref 8.9–10.3)
CO2: 24 mmol/L (ref 22–32)
CREATININE: 0.64 mg/dL (ref 0.44–1.00)
Chloride: 110 mmol/L (ref 101–111)
GFR calc Af Amer: >60 mL/min (ref >60)
GFR calc non Af Amer: >60 mL/min (ref >60)
GLUCOSE: 92 mg/dL (ref 65–99)
Potassium: 3 mmol/L — ABNORMAL LOW (ref 3.5–5.1)
Sodium: 138 mmol/L (ref 135–145)

## 2016-06-19 LAB — CBC
HEMATOCRIT: 31.5 % — AB (ref 36.0–46.0)
HEMOGLOBIN: 10.9 g/dL — AB (ref 12.0–15.0)
MCH: 30.9 pg (ref 26.0–34.0)
MCHC: 34.6 g/dL (ref 30.0–36.0)
MCV: 89.2 fL (ref 78.0–100.0)
Platelets: 217 10*3/uL (ref 150–400)
RBC: 3.53 MIL/uL — AB (ref 3.87–5.11)
RDW: 14.4 % (ref 11.5–15.5)
WBC: 16.1 10*3/uL — ABNORMAL HIGH (ref 4.0–10.5)

## 2016-06-19 MED ORDER — TETANUS-DIPHTH-ACELL PERTUSSIS 5-2.5-18.5 LF-MCG/0.5 IM SUSP: 0.5000 mL | Freq: Once | INTRAMUSCULAR | Status: DC

## 2016-06-19 MED ORDER — LACTATED RINGERS IV SOLN
INTRAVENOUS | Status: DC
  Administered 2016-06-19: 03:00:00 via INTRAVENOUS

## 2016-06-19 MED ORDER — WITCH HAZEL-GLYCERIN EX PADS: 1.0000 "application " | MEDICATED_PAD | CUTANEOUS | Status: DC | PRN

## 2016-06-19 MED ORDER — POTASSIUM CHLORIDE CRYS ER 20 MEQ PO TBCR
20.0000 meq | EXTENDED_RELEASE_TABLET | Freq: Two times a day (BID) | ORAL | Status: DC
  Administered 2016-06-19 – 2016-06-21 (×5): 20 meq via ORAL
  Filled 2016-06-19 (×7): qty 1

## 2016-06-19 MED ORDER — AMLODIPINE BESYLATE 10 MG PO TABS
10.0000 mg | ORAL_TABLET | Freq: Every day | ORAL | Status: DC
  Administered 2016-06-19 – 2016-06-21 (×3): 10 mg via ORAL
  Filled 2016-06-19 (×4): qty 1

## 2016-06-19 MED ORDER — FAMOTIDINE 20 MG PO TABS
40.0000 mg | ORAL_TABLET | Freq: Once | ORAL | Status: AC
  Administered 2016-06-20: 40 mg via ORAL
  Filled 2016-06-19: qty 2

## 2016-06-19 MED ORDER — ONDANSETRON HCL 4 MG/2ML IJ SOLN: 4.0000 mg | INTRAMUSCULAR | Status: DC | PRN

## 2016-06-19 MED ORDER — LACTATED RINGERS IV SOLN
INTRAVENOUS | Status: DC
Start: 2016-06-20 — End: 2016-06-22
  Administered 2016-06-20: 20 mL/h via INTRAVENOUS

## 2016-06-19 MED ORDER — ZOLPIDEM TARTRATE 5 MG PO TABS
5.0000 mg | ORAL_TABLET | Freq: Every evening | ORAL | Status: DC | PRN
  Administered 2016-06-19: 5 mg via ORAL
  Filled 2016-06-19: qty 1

## 2016-06-19 MED ORDER — SENNOSIDES-DOCUSATE SODIUM 8.6-50 MG PO TABS
2.0000 | ORAL_TABLET | ORAL | Status: DC
  Administered 2016-06-20 – 2016-06-21 (×2): 2 via ORAL
  Filled 2016-06-19 (×3): qty 2

## 2016-06-19 MED ORDER — METOCLOPRAMIDE HCL 10 MG PO TABS
10.0000 mg | ORAL_TABLET | Freq: Once | ORAL | Status: AC
  Administered 2016-06-20: 10 mg via ORAL
  Filled 2016-06-19: qty 1

## 2016-06-19 MED ORDER — ACETAMINOPHEN 325 MG PO TABS
650.0000 mg | ORAL_TABLET | ORAL | Status: DC | PRN
  Filled 2016-06-19: qty 2

## 2016-06-19 MED ORDER — SIMETHICONE 80 MG PO CHEW
80.0000 mg | CHEWABLE_TABLET | ORAL | Status: DC | PRN
  Administered 2016-06-20 – 2016-06-21 (×2): 80 mg via ORAL
  Filled 2016-06-19 (×2): qty 1

## 2016-06-19 MED ORDER — IBUPROFEN 600 MG PO TABS
600.0000 mg | ORAL_TABLET | Freq: Four times a day (QID) | ORAL | Status: DC
  Administered 2016-06-19 – 2016-06-21 (×11): 600 mg via ORAL
  Filled 2016-06-19 (×11): qty 1

## 2016-06-19 MED ORDER — BENZOCAINE-MENTHOL 20-0.5 % EX AERO: 1.0000 "application " | INHALATION_SPRAY | CUTANEOUS | Status: DC | PRN

## 2016-06-19 MED ORDER — PRENATAL MULTIVITAMIN CH
1.0000 | ORAL_TABLET | Freq: Every day | ORAL | Status: DC
  Administered 2016-06-19 – 2016-06-21 (×3): 1 via ORAL
  Filled 2016-06-19 (×3): qty 1

## 2016-06-19 MED ORDER — COCONUT OIL OIL: 1.0000 "application " | TOPICAL_OIL | Status: DC | PRN

## 2016-06-19 MED ORDER — ONDANSETRON HCL 4 MG PO TABS: 4.0000 mg | ORAL_TABLET | ORAL | Status: DC | PRN

## 2016-06-19 MED ORDER — DIPHENHYDRAMINE HCL 25 MG PO CAPS: 25.0000 mg | ORAL_CAPSULE | Freq: Four times a day (QID) | ORAL | Status: DC | PRN

## 2016-06-19 MED ORDER — DIBUCAINE 1 % RE OINT: 1.0000 "application " | TOPICAL_OINTMENT | RECTAL | Status: DC | PRN

## 2016-06-19 NOTE — Anesthesia Postprocedure Evaluation (Signed)
Anesthesia Post Note  Patient: Maureen Simpson  Procedure(s) Performed: * No procedures listed *  Patient location during evaluation: Mother Baby Anesthesia Type: Epidural Level of consciousness: awake, awake and alert, oriented and patient cooperative Pain management: pain level controlled Vital Signs Assessment: post-procedure vital signs reviewed and stable Respiratory status: spontaneous breathing, nonlabored ventilation and respiratory function stable Cardiovascular status: stable Postop Assessment: no headache, no backache, patient able to bend at knees and no signs of nausea or vomiting Anesthetic complications: no     Last Vitals:  Filed Vitals:   06/19/16 0230 06/19/16 0557  BP: 150/73 148/80  Pulse: 68 70  Temp: 37.1 C 36.9 C  Resp: 20 20    Last Pain:  Filed Vitals:   06/19/16 0613  PainSc: 0-No pain   Pain Goal: Patients Stated Pain Goal: 0 (06/17/16 2012)               Larena Ohnemus L

## 2016-06-19 NOTE — Progress Notes (Signed)
MD notified regarding BP. No new orders

## 2016-06-19 NOTE — Progress Notes (Signed)
Md notified of B/P. No new orders at this time.

## 2016-06-20 ENCOUNTER — Encounter (HOSPITAL_COMMUNITY): Payer: Self-pay | Admitting: Obstetrics and Gynecology

## 2016-06-20 ENCOUNTER — Inpatient Hospital Stay (HOSPITAL_COMMUNITY): Payer: Medicaid Other | Admitting: Anesthesiology

## 2016-06-20 ENCOUNTER — Encounter (HOSPITAL_COMMUNITY)
Admission: AD | Disposition: A | Discharge status: Home / Self Care | Payer: Self-pay | Source: Ambulatory Visit | Attending: Obstetrics and Gynecology

## 2016-06-20 DIAGNOSIS — Z302 Encounter for sterilization: Secondary | ICD-10-CM | POA: Diagnosis not present

## 2016-06-20 HISTORY — PX: TUBAL LIGATION: SHX77

## 2016-06-20 LAB — COMPREHENSIVE METABOLIC PANEL
ALBUMIN: 3 g/dL — AB (ref 3.5–5.0)
ALT: 28 U/L (ref 14–54)
AST: 52 U/L — AB (ref 15–41)
Alkaline Phosphatase: 179 U/L — ABNORMAL HIGH (ref 38–126)
Anion gap: 8 (ref 5–15)
CHLORIDE: 106 mmol/L (ref 101–111)
CO2: 24 mmol/L (ref 22–32)
CREATININE: 0.31 mg/dL — AB (ref 0.44–1.00)
Calcium: 8.4 mg/dL — ABNORMAL LOW (ref 8.9–10.3)
GFR calc Af Amer: >60 mL/min (ref >60)
GFR calc non Af Amer: >60 mL/min (ref >60)
GLUCOSE: 78 mg/dL (ref 65–99)
POTASSIUM: 2.9 mmol/L — AB (ref 3.5–5.1)
SODIUM: 138 mmol/L (ref 135–145)
Total Bilirubin: 0.5 mg/dL (ref 0.3–1.2)
Total Protein: 6.2 g/dL — ABNORMAL LOW (ref 6.5–8.1)

## 2016-06-20 SURGERY — LIGATION, FALLOPIAN TUBE, POSTPARTUM
Anesthesia: Epidural | Site: Abdomen

## 2016-06-20 SURGERY — LIGATION, FALLOPIAN TUBE, POSTPARTUM
Anesthesia: Choice

## 2016-06-20 MED ORDER — OXYCODONE-ACETAMINOPHEN 5-325 MG PO TABS
1.0000 | ORAL_TABLET | ORAL | Status: DC | PRN
  Administered 2016-06-20 – 2016-06-21 (×4): 2 via ORAL
  Filled 2016-06-20 (×4): qty 2

## 2016-06-20 MED ORDER — MIDAZOLAM HCL 2 MG/2ML IJ SOLN
INTRAMUSCULAR | Status: AC
  Filled 2016-06-20: qty 2

## 2016-06-20 MED ORDER — FENTANYL CITRATE (PF) 100 MCG/2ML IJ SOLN
25.0000 ug | INTRAMUSCULAR | Status: DC | PRN
  Administered 2016-06-20: 25 ug via INTRAVENOUS

## 2016-06-20 MED ORDER — BUPIVACAINE HCL (PF) 0.25 % IJ SOLN
INTRAMUSCULAR | Status: DC | PRN
  Administered 2016-06-20: 10 mL

## 2016-06-20 MED ORDER — MIDAZOLAM HCL 2 MG/2ML IJ SOLN
INTRAMUSCULAR | Status: DC | PRN
  Administered 2016-06-20: 2 mg via INTRAVENOUS

## 2016-06-20 MED ORDER — LACTATED RINGERS IV SOLN
INTRAVENOUS | Status: DC | PRN
  Administered 2016-06-20: 09:00:00 via INTRAVENOUS

## 2016-06-20 MED ORDER — HYDROCHLOROTHIAZIDE 25 MG PO TABS
25.0000 mg | ORAL_TABLET | Freq: Every day | ORAL | Status: DC
  Administered 2016-06-20 – 2016-06-21 (×2): 25 mg via ORAL
  Filled 2016-06-20 (×3): qty 1

## 2016-06-20 MED ORDER — LIDOCAINE-EPINEPHRINE (PF) 2 %-1:200000 IJ SOLN
INTRAMUSCULAR | Status: AC
  Filled 2016-06-20: qty 20

## 2016-06-20 MED ORDER — SODIUM BICARBONATE 8.4 % IV SOLN
INTRAVENOUS | Status: DC | PRN
  Administered 2016-06-20: 5 mL via EPIDURAL
  Administered 2016-06-20: 2 mL via EPIDURAL
  Administered 2016-06-20: 10 mL via EPIDURAL
  Administered 2016-06-20: 3 mL via EPIDURAL

## 2016-06-20 MED ORDER — FENTANYL CITRATE (PF) 100 MCG/2ML IJ SOLN
INTRAMUSCULAR | Status: AC
  Filled 2016-06-20: qty 2

## 2016-06-20 MED ORDER — SODIUM BICARBONATE 8.4 % IV SOLN
INTRAVENOUS | Status: AC
  Filled 2016-06-20: qty 50

## 2016-06-20 SURGICAL SUPPLY — 25 items
CONTAINER PREFILL 10% NBF 15ML (MISCELLANEOUS) ×6 IMPLANT
DRSG OPSITE POSTOP 3X4 (GAUZE/BANDAGES/DRESSINGS) ×3 IMPLANT
DURAPREP 26ML APPLICATOR (WOUND CARE) ×3 IMPLANT
GLOVE BIOGEL PI IND STRL 6.5 (GLOVE) ×1 IMPLANT
GLOVE BIOGEL PI IND STRL 7.0 (GLOVE) ×1 IMPLANT
GLOVE BIOGEL PI INDICATOR 6.5 (GLOVE) ×2
GLOVE BIOGEL PI INDICATOR 7.0 (GLOVE) ×2
GLOVE SURG SS PI 6.0 STRL IVOR (GLOVE) ×3 IMPLANT
GOWN STRL REUS W/TWL LRG LVL3 (GOWN DISPOSABLE) ×6 IMPLANT
NEEDLE HYPO 22GX1.5 SAFETY (NEEDLE) ×2 IMPLANT
NS IRRIG 1000ML POUR BTL (IV SOLUTION) ×3 IMPLANT
PACK ABDOMINAL MINOR (CUSTOM PROCEDURE TRAY) ×3 IMPLANT
PROTECTOR NERVE ULNAR (MISCELLANEOUS) ×6 IMPLANT
SLEEVE SCD COMPRESS KNEE MED (MISCELLANEOUS) ×2 IMPLANT
SPONGE GAUZE 2X2 8PLY STER LF (GAUZE/BANDAGES/DRESSINGS) ×1
SPONGE GAUZE 2X2 8PLY STRL LF (GAUZE/BANDAGES/DRESSINGS) ×2 IMPLANT
SPONGE LAP 4X18 X RAY DECT (DISPOSABLE) ×2 IMPLANT
SUT PLAIN 0 NONE (SUTURE) ×3 IMPLANT
SUT VIC AB 0 CT1 27 (SUTURE) ×3
SUT VIC AB 0 CT1 27XBRD ANBCTR (SUTURE) ×1 IMPLANT
SUT VIC AB 3-0 PS2 18 (SUTURE) ×3 IMPLANT
SYR CONTROL 10ML LL (SYRINGE) ×2 IMPLANT
TOWEL OR 17X24 6PK STRL BLUE (TOWEL DISPOSABLE) ×6 IMPLANT
TRAY FOLEY CATH SILVER 14FR (SET/KITS/TRAYS/PACK) ×3 IMPLANT
WATER STERILE IRR 1000ML POUR (IV SOLUTION) ×1 IMPLANT

## 2016-06-20 NOTE — Progress Notes (Signed)
POSTPARTUM PROGRESS NOTE  Post Partum Day 1 Subjective:  Maureen Simpson is a 40 y.o. [id number] [redacted]w[redacted]d s/p NSVD with 2 minute shoulder dystocia after IOL for gHTN not treated with medications.  No acute events overnight.  Pt denies problems with ambulating, voiding or po intake.  She denies nausea or vomiting.  Pain is well controlled.  She has had flatus. She has had bowel movement.  Lochia Minimal.   Objective: Blood pressure 142/85, pulse 61, temperature 98.1 F (36.7 C), temperature source Oral, resp. rate 18, height 5\' 3"  (1.6 m), weight 187 lb (84.823 kg), last menstrual period 10/27/2015, SpO2 98 %, unknown if currently breastfeeding.  Physical Exam:  General: alert, cooperative and no distress Lochia:normal flow Chest: CTAB Heart: RRR no m/r/g Abdomen: +BS, soft, nontender,  Uterine Fundus: firm, 2 below umbilicus DVT Evaluation: No calf swelling or tenderness Extremities: No edema   Recent Labs  06/18/16 1325 06/19/16 0738  HGB 12.0 10.9*  HCT 34.4* 31.5*    Assessment/Plan:  ASSESSMENT: Maureen Simpson is a 40 y.o. [id number] [redacted]w[redacted]d s/p NSVD  Discharge home after BTL if pt desires Breast/Bottle  Scheduled BTL today at 11AM   LOS: 3 days   Melany Guernsey 06/20/2016, 7:51 AM    CNM attestation Post Partum Day #1 I have seen and examined this patient and agree with above documentation in the resident's note.   Maureen Simpson is a 40 y.o. [id number] s/p SVD.  Pt denies problems with ambulating, voiding or po intake. Pain is well controlled.  Plan for birth control is bilateral tubal ligation.  Method of Feeding: both  PE:  BP 149/87 mmHg  Pulse 87  Temp(Src) 98.8 F (37.1 C) (Oral)  Resp 17  Ht 5\' 3"  (1.6 m)  Wt 84.823 kg (187 lb)  BMI 33.13 kg/m2  SpO2 97%  LMP 10/27/2015 (Approximate)  Breastfeeding? Unknown Fundus firm  Plan for discharge: possibly today after BTL, or tomorrow  Serita Grammes, CNM 11:03 PM

## 2016-06-20 NOTE — Op Note (Signed)
PREOPERATIVE DIAGNOSIS:  Undesired fertility  POSTOPERATIVE DIAGNOSIS:  Undesired fertility  PROCEDURE:  Postpartum Bilateral Tubal Sterilization using Pomeroy method   ANESTHESIA:  Epidural  COMPLICATIONS:  None immediate.  ESTIMATED BLOOD LOSS:  Less than 20cc.  FLUIDS: 200 cc LR.  INDICATIONS: 40 y.o. yo G2P2002  with undesired fertility,status post vaginal delivery, desires permanent sterilization. Risks and benefits of procedure discussed with patient including permanence of method, bleeding, infection, injury to surrounding organs and need for additional procedures. Risk failure of 0.5-1% with increased risk of ectopic gestation if pregnancy occurs was also discussed with patient.   FINDINGS:  Normal uterus, tubes, and ovaries.  TECHNIQUE: After informed consent was obtained, the patient was taken to the operating room where anesthesia was induced and found to be adequate. A small transverse, infraumbilical skin incision was made with the scalpel. This incision was carried down to the underlying layer of fascia. The fascia was grasped with Kocher clamps tented up and entered sharply with Mayo scissors. Underlying peritoneum was then identified tented up and entered sharply with Metzenbaum scissors. The fascia was tagged with 0 Vicryl. The patient's left fallopian tube was then identified, brought to the incision, and grasped with a Babcock clamp. The tube was then followed out to the fimbria. The Babcock clamp was then used to grasp the tube approximately 4 cm from the cornual region. A 3 cm segment of the tube was then ligated with free tie of plain gut suture, transected and excised. Good hemostasis was noted and the tube was returned to the abdomen. The right fallopian tube was then identified to its fimbriated end, ligated, and a 3 cm segment excised in a similar fashion. Excellent hemostasis was noted, and the tube returned to the abdomen. The fascia was re-approximated with 0 Vicryl.  The skin was closed in a subcuticular fashion with 3-0 Vicryl. Quarter percent Marcaine solution was then injected at the incision site. The patient tolerated the procedure well. Sponge, lap, and needle count were correct x2. The patient was taken to recovery room in stable condition.

## 2016-06-20 NOTE — Progress Notes (Signed)
Patient ID: Maureen Simpson, female   DOB: 01/15/1976, 40 y.o.   MRN: [id number] 40 y.o. yo G2P2002  with undesired fertility,status post vaginal delivery, desires permanent sterilization. Risks and benefits of procedure discussed with patient including permanence of method, bleeding, infection, injury to surrounding organs and need for additional procedures. Risk failure of 0.5-1% with increased risk of ectopic gestation if pregnancy occurs was also discussed with patient.

## 2016-06-20 NOTE — Anesthesia Preprocedure Evaluation (Signed)
Anesthesia Evaluation  Patient identified by MRN, date of birth, ID band Patient awake    Reviewed: Allergy & Precautions, H&P , NPO status , Patient's Chart, lab work & pertinent test results  History of Anesthesia Complications Negative for: history of anesthetic complications  Airway Mallampati: II  TM Distance: >3 FB Neck ROM: full    Dental no notable dental hx. (+) Teeth Intact   Pulmonary neg pulmonary ROS, former smoker,    Pulmonary exam normal breath sounds clear to auscultation       Cardiovascular negative cardio ROS Normal cardiovascular exam Rhythm:regular Rate:Normal     Neuro/Psych negative neurological ROS  negative psych ROS   GI/Hepatic negative GI ROS, Neg liver ROS,   Endo/Other  negative endocrine ROS  Renal/GU negative Renal ROS  negative genitourinary   Musculoskeletal   Abdominal   Peds  Hematology negative hematology ROS (+)   Anesthesia Other Findings   Reproductive/Obstetrics (+) Pregnancy                             Anesthesia Physical  Anesthesia Plan  ASA: II  Anesthesia Plan: Epidural   Post-op Pain Management:    Induction:   Airway Management Planned:   Additional Equipment:   Intra-op Plan:   Post-operative Plan:   Informed Consent: I have reviewed the patients History and Physical, chart, labs and discussed the procedure including the risks, benefits and alternatives for the proposed anesthesia with the patient or authorized representative who has indicated his/her understanding and acceptance.     Plan Discussed with:   Anesthesia Plan Comments:         Anesthesia Quick Evaluation                                  Anesthesia Evaluation  Patient identified by MRN, date of birth, ID band Patient awake    Reviewed: Allergy & Precautions, H&P , NPO status , Patient's Chart, lab work & pertinent test results  History of  Anesthesia Complications Negative for: history of anesthetic complications  Airway Mallampati: II  TM Distance: >3 FB Neck ROM: full    Dental no notable dental hx. (+) Teeth Intact   Pulmonary neg pulmonary ROS, former smoker,    Pulmonary exam normal breath sounds clear to auscultation       Cardiovascular negative cardio ROS Normal cardiovascular exam Rhythm:regular Rate:Normal     Neuro/Psych negative neurological ROS  negative psych ROS   GI/Hepatic negative GI ROS, Neg liver ROS,   Endo/Other  negative endocrine ROS  Renal/GU negative Renal ROS  negative genitourinary   Musculoskeletal   Abdominal   Peds  Hematology negative hematology ROS (+)   Anesthesia Other Findings   Reproductive/Obstetrics (+) Pregnancy                             Anesthesia Physical Anesthesia Plan  ASA: II  Anesthesia Plan: Epidural   Post-op Pain Management:    Induction:   Airway Management Planned:   Additional Equipment:   Intra-op Plan:   Post-operative Plan:   Informed Consent: I have reviewed the patients History and Physical, chart, labs and discussed the procedure including the risks, benefits and alternatives for the proposed anesthesia with the patient or authorized representative who has indicated his/her understanding and acceptance.  Plan Discussed with:   Anesthesia Plan Comments:         Anesthesia Quick Evaluation

## 2016-06-20 NOTE — Progress Notes (Signed)
Pt was given 25 mcg of Fentanyl in PACU for pain, wasted other 52mcg in sink witnessed by Vallery Ridge RN, House Coverage.  Unable to waste in [place]

## 2016-06-20 NOTE — Anesthesia Postprocedure Evaluation (Signed)
Anesthesia Post Note  Patient: Maureen Simpson  Procedure(s) Performed: Procedure(s) (LRB): POST PARTUM TUBAL LIGATION (N/A)  Patient location during evaluation: Mother Baby Anesthesia Type: Epidural Level of consciousness: oriented and awake and alert Pain management: pain level controlled Vital Signs Assessment: post-procedure vital signs reviewed and stable Respiratory status: spontaneous breathing and nonlabored ventilation Cardiovascular status: stable Postop Assessment: epidural receding, no signs of nausea or vomiting, patient able to bend at knees and adequate PO intake Anesthetic complications: no     Last Vitals:  Filed Vitals:   06/20/16 1240 06/20/16 1336  BP: 170/88 149/82  Pulse: 66 70  Temp: 37.1 C   Resp: 20 20    Last Pain:  Filed Vitals:   06/20/16 1423  PainSc: 3    Pain Goal: Patients Stated Pain Goal: 4 (06/20/16 1234)               Brexley Cutshaw Hristova

## 2016-06-20 NOTE — Transfer of Care (Signed)
Immediate Anesthesia Transfer of Care Note  Patient: Maureen Simpson  Procedure(s) Performed: Procedure(s): POST PARTUM TUBAL LIGATION (N/A)  Patient Location: PACU  Anesthesia Type:Epidural  Level of Consciousness: awake, alert  and oriented  Airway & Oxygen Therapy: Patient Spontanous Breathing  Post-op Assessment: Report given to RN and Post -op Vital signs reviewed and stable  Post vital signs: Reviewed and stable  Last Vitals:  Filed Vitals:   06/20/16 0520 06/20/16 0811  BP: 142/85 149/85  Pulse: 61 57  Temp: 36.7 C 36.8 C  Resp: 18 20    Last Pain:  Filed Vitals:   06/20/16 0811  PainSc: 0-No pain      Patients Stated Pain Goal: 0 ([id number])  Complications: No apparent anesthesia complications

## 2016-06-21 DIAGNOSIS — Z9851 Tubal ligation status: Secondary | ICD-10-CM

## 2016-06-21 MED ORDER — AMLODIPINE BESYLATE 10 MG PO TABS: 10.0000 mg | ORAL_TABLET | Freq: Every day | ORAL | Status: DC

## 2016-06-21 MED ORDER — FLEET ENEMA 7-19 GM/118ML RE ENEM
1.0000 | ENEMA | Freq: Once | RECTAL | Status: AC
  Administered 2016-06-21: 1 via RECTAL

## 2016-06-21 MED ORDER — OXYCODONE HCL 5 MG PO TABS: 5.0000 mg | ORAL_TABLET | ORAL | Status: DC | PRN

## 2016-06-21 MED ORDER — ACETAMINOPHEN 325 MG PO TABS: 650.0000 mg | ORAL_TABLET | ORAL | Status: DC | PRN

## 2016-06-21 NOTE — Discharge Summary (Cosign Needed)
OB Discharge Summary     Patient Name: Maureen Simpson DOB: March 05, 1976 MRN: [id number]  Date of admission: 06/17/2016 Delivering MD: Christin Fudge   Date of discharge: 06/21/2016  Admitting diagnosis: INDUCTION Desires Sterilization Desires Sterilization Intrauterine pregnancy: [redacted]w[redacted]d     Secondary diagnosis:  Active Problems:   Gestational hypertension   NSVD (normal spontaneous vaginal delivery)   S/P tubal ligation  Additional problems: none     Discharge diagnosis: Term Pregnancy Delivered                                                                                                Post partum procedures:postpartum tubal ligation  Augmentation: AROM, Pitocin and Cytotec  Complications: None  Hospital course:  Induction of Labor With Vaginal Delivery   40 y.o. yo G2P2002 at [redacted]w[redacted]d was admitted to the hospital 06/17/2016 for induction of labor.  Indication for induction: Gestational hypertension.  Patient had an uncomplicated labor course as follows: Membrane Rupture Time/Date: 12:14 PM ,06/18/2016   Intrapartum Procedures: Episiotomy: None [1]                                         Lacerations:  None [1]  Patient had delivery of a Viable infant.  Information for the patient's newborn:  Lexus, Asad [id number]  Delivery Method: Vaginal, Spontaneous Delivery (Filed from Delivery Summary)   06/19/2016  Details of delivery can be found in separate delivery note.  Patient had a routine postpartum course. Patient is discharged home 06/21/2016.  Gestational Hypertension: patient started on HCTZ and amlodipine after delivery. Blood pressures in normal to mild range. HCTZ discontinued at time of discharge due to concern for hypokalemia. So she was discharged on amlodipine 10 mg daily. Baby love ordered at time of discharge. Patient will have a follow up in one week for blood pressure  Hypokalemia: K 3.0. She received 20 mEq x2 prior to discharge.  Discontinued HCTZ at time of discharge. Recommend BMP at follow up in a week. Advised patient to eat green leafy vegetables and fruits rich in potassium  Physical exam  Filed Vitals:   06/21/16 0100 06/21/16 0500 06/21/16 1030 06/21/16 1726  BP: 145/81 135/81 138/60 149/87  Pulse: 67 65 65 87  Temp: 97.6 F (36.4 C) 97.9 F (36.6 C)  98.8 F (37.1 C)  TempSrc: Oral Oral  Oral  Resp: 18 18  17   Height:      Weight:      SpO2:       General: alert, cooperative and no distress Lochia: appropriate Uterine Fundus: firm Incision: Dressing is clean, dry, and intact DVT Evaluation: No evidence of DVT seen on physical exam. Labs: Lab Results  Component Value Date   WBC 16.1* 06/19/2016   HGB 10.9* 06/19/2016   HCT 31.5* 06/19/2016   MCV 89.2 06/19/2016   PLT 217 06/19/2016   CMP Latest Ref Rng 06/19/2016  Glucose 65 - 99 mg/dL 92  BUN 6 - 20 mg/dL 6  Creatinine  0.44 - 1.00 mg/dL 0.64  Sodium 135 - 145 mmol/L 138  Potassium 3.5 - 5.1 mmol/L 3.0(L)  Chloride 101 - 111 mmol/L 110  CO2 22 - 32 mmol/L 24  Calcium 8.9 - 10.3 mg/dL 8.2(L)  Total Protein 6.5 - 8.1 g/dL -  Total Bilirubin 0.3 - 1.2 mg/dL -  Alkaline Phos 38 - 126 U/L -  AST 15 - 41 U/L -  ALT 14 - 54 U/L -    Discharge instruction: per After Visit Summary and "Baby and Me Booklet".  After visit meds:    Medication List    STOP taking these medications        diphenhydramine-acetaminophen 25-500 MG Tabs tablet  Commonly known as:  TYLENOL PM     TYLENOL COLD RELIEF PO      TAKE these medications        acetaminophen 325 MG tablet  Commonly known as:  TYLENOL  Take 2 tablets (650 mg total) by mouth every 4 (four) hours as needed (for pain scale < 4).     amLODipine 10 MG tablet  Commonly known as:  NORVASC  Take 1 tablet (10 mg total) by mouth daily.     oxyCODONE 5 MG immediate release tablet  Commonly known as:  ROXICODONE  Take 1-2 tablets (5-10 mg total) by mouth every 4 (four) hours as needed  for severe pain.     PREPLUS 27-1 MG Tabs  TAKE 1 TABLET BY MOUTH DAILY        Diet: routine diet  Activity: Advance as tolerated. Pelvic rest for 6 weeks.   Outpatient follow up:one week for follow up on blood pressure Follow-up Information    Follow up with [facility] In 1 week.   Why:  Blood pressure check in postpartum   Contact information:   [place] [address] [phone]       Postpartum contraception: Tubal Ligation (done)  Newborn Data: Live born female  Birth Weight: 8 lb 6 oz (3799 g) APGAR: 3, 8  Baby Feeding: Bottle Disposition:home with mother   06/21/2016 Mercy Riding, MD

## 2016-06-21 NOTE — Progress Notes (Addendum)
Pt passed gas and had a BM with fleets enema. Abdomen soft with audible bowel sounds in all four quadrants. BP 140s/80s  CNM,Darlene notified.

## 2016-06-21 NOTE — Progress Notes (Signed)
Fleets enema administered for abdomen distention with only small amts of flatus and decreased bowel sounds. Pt tolerated procedure well. Will follow up.

## 2016-06-21 NOTE — Progress Notes (Signed)
Post Partum Day 2 SVB/POD#1 BTL Subjective: Eating, drinking, voiding, ambulating well.  NO flatus.  Lochia and pain wnl.  Denies dizziness, lightheadedness, or sob. No complaints.   Objective: Blood pressure 135/81, pulse 65, temperature 97.9 F (36.6 C), temperature source Oral, resp. rate 18, height 5\' 3"  (1.6 m), weight 84.823 kg (187 lb), last menstrual period 10/27/2015, SpO2 97 %, unknown if currently breastfeeding.  Physical Exam:  General: alert, cooperative and no distress Lochia: appropriate Uterine Fundus: firm Incision: healing well, no significant drainage Abdomen: slightly distended, hypoactive bowel sounds DVT Evaluation: No evidence of DVT seen on physical exam. Negative Homan's sign. No cords or calf tenderness. No significant calf/ankle edema.   Recent Labs  06/18/16 1325 06/19/16 0738  HGB 12.0 10.9*  HCT 34.4* 31.5*    Assessment/Plan: Discussed w/ Dr. Elly Modena, to ambulate in hallway today, if begins passing gas/distension goes down, will d/c later today Notified RN of plan   LOS: 4 days   Tawnya Crook 06/21/2016, 8:10 AM

## 2016-06-21 NOTE — Discharge Instructions (Signed)

## 2016-06-22 ENCOUNTER — Encounter (HOSPITAL_COMMUNITY): Payer: Self-pay | Admitting: Obstetrics and Gynecology

## 2016-06-27 NOTE — Discharge Summary (Signed)
Keitha Butte, CNM Certified Nurse Midwife Cosign Needed Family Medicine Discharge Summaries 06/21/2016 8:34 PM    Expand All Collapse All    OB Discharge Summary   Patient Name: Maureen Simpson DOB: Jun 30, 1976 MRN: [id number]  Date of admission: 06/17/2016 Delivering MD: Christin Fudge   Date of discharge: 06/21/2016  Admitting diagnosis: INDUCTION Desires Sterilization Desires Sterilization Intrauterine pregnancy: [redacted]w[redacted]d  Secondary diagnosis: Active Problems:  Gestational hypertension  NSVD (normal spontaneous vaginal delivery)  S/P tubal ligation  Additional problems: none  Discharge diagnosis: Term Pregnancy Delivered    Post partum procedures:postpartum tubal ligation  Augmentation: AROM, Pitocin and Cytotec  Complications: None  Hospital course: Induction of Labor With Vaginal Delivery  40 y.o. yo G2P2002 at [redacted]w[redacted]d was admitted to the hospital 06/17/2016 for induction of labor. Indication for induction: Gestational hypertension. Patient had an uncomplicated labor course as follows: Membrane Rupture Time/Date: 12:14 PM ,06/18/2016   Intrapartum Procedures: Episiotomy: None [1]   Lacerations: None [1]  Patient had delivery of a Viable infant.  Information for the patient's newborn:  Annalecia, Derian [id number]  Delivery Method: Vaginal, Spontaneous Delivery (Filed from Delivery Summary)   06/19/2016  Details of delivery can be found in separate delivery note. Patient had a routine postpartum course. Patient is discharged home 06/21/2016.  Gestational Hypertension: patient started on HCTZ and amlodipine after delivery. Blood pressures in normal to mild range. HCTZ discontinued at time of discharge due to concern for  hypokalemia. So she was discharged on amlodipine 10 mg daily. Baby love ordered at time of discharge. Patient will have a follow up in one week for blood pressure  Hypokalemia: K 3.0. She received 20 mEq x2 prior to discharge. Discontinued HCTZ at time of discharge. Recommend BMP at follow up in a week. Advised patient to eat green leafy vegetables and fruits rich in potassium  Physical exam  Filed Vitals:   06/21/16 0100 06/21/16 0500 06/21/16 1030 06/21/16 1726  BP: 145/81 135/81 138/60 149/87  Pulse: 67 65 65 87  Temp: 97.6 F (36.4 C) 97.9 F (36.6 C)  98.8 F (37.1 C)  TempSrc: Oral Oral  Oral  Resp: 18 18  17   Height:      Weight:      SpO2:       General: alert, cooperative and no distress Lochia: appropriate Uterine Fundus: firm Incision: Dressing is clean, dry, and intact DVT Evaluation: No evidence of DVT seen on physical exam. Labs:  Recent Labs    Lab Results  Component Value Date   WBC 16.1* 06/19/2016   HGB 10.9* 06/19/2016   HCT 31.5* 06/19/2016   MCV 89.2 06/19/2016   PLT 217 06/19/2016     CMP Latest Ref Rng 06/19/2016  Glucose 65 - 99 mg/dL 92  BUN 6 - 20 mg/dL 6  Creatinine 0.44 - 1.00 mg/dL 0.64  Sodium 135 - 145 mmol/L 138  Potassium 3.5 - 5.1 mmol/L 3.0(L)  Chloride 101 - 111 mmol/L 110  CO2 22 - 32 mmol/L 24  Calcium 8.9 - 10.3 mg/dL 8.2(L)  Total Protein 6.5 - 8.1 g/dL -  Total Bilirubin 0.3 - 1.2 mg/dL -  Alkaline Phos 38 - 126 U/L -  AST 15 - 41 U/L -  ALT 14 - 54 U/L -    Discharge instruction: per After Visit Summary and "Baby and Me Booklet".  After visit meds:    Medication List    STOP taking these medications       diphenhydramine-acetaminophen 25-500  MG Tabs tablet  Commonly known as: TYLENOL PM     TYLENOL COLD RELIEF PO      TAKE these medications       acetaminophen 325 MG tablet   Commonly known as: TYLENOL  Take 2 tablets (650 mg total) by mouth every 4 (four) hours as needed (for pain scale < 4).     amLODipine 10 MG tablet  Commonly known as: NORVASC  Take 1 tablet (10 mg total) by mouth daily.     oxyCODONE 5 MG immediate release tablet  Commonly known as: ROXICODONE  Take 1-2 tablets (5-10 mg total) by mouth every 4 (four) hours as needed for severe pain.     PREPLUS 27-1 MG Tabs  TAKE 1 TABLET BY MOUTH DAILY        Diet: routine diet  Activity: Advance as tolerated. Pelvic rest for 6 weeks.   Outpatient follow up:one week for follow up on blood pressure Follow-up Information    Follow up with [facility] In 1 week.   Why: Blood pressure check in postpartum   Contact information:   [place] [address] [phone]       Postpartum contraception: Tubal Ligation (done)  Newborn Data: Live born female  Birth Weight: 8 lb 6 oz (3799 g) APGAR: 3, 8  Baby Feeding: Bottle Disposition:home with mother   06/21/2016 Mercy Riding, MD

## 2016-07-05 ENCOUNTER — Other Ambulatory Visit: Payer: Self-pay | Admitting: Obstetrics & Gynecology

## 2016-07-07 ENCOUNTER — Telehealth: Payer: Self-pay

## 2016-07-07 NOTE — Telephone Encounter (Signed)
Advised patient that we could not give her any pain medicine at this time. I advised her that she is two weeks postop and if she is still having severe pain and Tylenol is not helping she should  go straight to MAU to be check out. Patient verbalizes understanding.

## 2016-07-07 NOTE — Telephone Encounter (Signed)
Patient is requesting more pain medicine. She had Tubal on 06/25/2016

## 2016-07-27 ENCOUNTER — Other Ambulatory Visit: Payer: Self-pay | Admitting: Student

## 2016-07-31 ENCOUNTER — Telehealth: Payer: Self-pay | Admitting: *Deleted

## 2016-07-31 NOTE — Telephone Encounter (Signed)
Received a message left on the nurse voicemail on 07/31/16 at 1331.  Patient states she has questions about increasing pain.  Requests a return call to [phone].

## 2016-08-05 NOTE — Telephone Encounter (Signed)
Called pt and left message stating that I am returning her call to discuss her concerns. If she still has questions or would like to speak with a nurse please call back and leave a detailed message stating her problem. Also state whether we may leave a detailed message with our return call.

## 2016-08-06 ENCOUNTER — Other Ambulatory Visit: Payer: Self-pay | Admitting: Student

## 2016-08-14 ENCOUNTER — Ambulatory Visit: Payer: Medicaid Other | Admitting: Obstetrics & Gynecology

## 2016-08-17 NOTE — Telephone Encounter (Signed)
Patient has upcoming postpartum appointment on 08/19/2016

## 2016-08-19 ENCOUNTER — Encounter: Payer: Self-pay | Admitting: Advanced Practice Midwife

## 2016-08-19 ENCOUNTER — Ambulatory Visit (INDEPENDENT_AMBULATORY_CARE_PROVIDER_SITE_OTHER): Payer: Medicaid Other | Admitting: Advanced Practice Midwife

## 2016-08-19 VITALS — BP 158/104 | HR 89 | Wt 159.8 lb

## 2016-08-19 DIAGNOSIS — M7918 Myalgia, other site: Secondary | ICD-10-CM

## 2016-08-19 MED ORDER — AMLODIPINE BESYLATE 10 MG PO TABS: 10.0000 mg | ORAL_TABLET | Freq: Every day | ORAL | 1 refills | Status: DC

## 2016-08-19 MED ORDER — CYCLOBENZAPRINE HCL 10 MG PO TABS: 10.0000 mg | ORAL_TABLET | Freq: Three times a day (TID) | ORAL | 1 refills | Status: DC | PRN

## 2016-08-19 NOTE — Progress Notes (Signed)
Pt reports dizzy spells and also having headaches.  Pt states that she has not taken BP medication in two weeks due to running out.  Pt also reports back pain.  Subjective:     Maureen Simpson is a 40 y.o. female who presents for a postpartum visit. She is 4 weeks postpartum following a spontaneous vaginal delivery. I have fully reviewed the prenatal and intrapartum course. The delivery was at 40.4 gestational weeks. Outcome: spontaneous vaginal delivery. Anesthesia: epidural. Postpartum course has been complicated by pain btw shoulder blades and neck. Baby's course has been complicated colic. Baby is feeding by bottle - Carnation Good Start Soy. Bleeding no bleeding. Bowel function is normal. Bladder function is normal. Patient is not sexually active. Contraception method is tubal ligation. Postpartum depression screening: negative.  The following portions of the patient's history were reviewed and updated as appropriate: allergies, current medications, past family history, past medical history, past social history, past surgical history and problem list.  Review of Systems Pertinent items are noted in HPI.   Objective:    BP (!) 165/98   Pulse 89   Wt 159 lb 12.8 oz (72.5 kg)   Breastfeeding? No   BMI 28.31 kg/m   General:  alert, cooperative, appears stated age and no distress   Breasts:  Declined  Lungs: clear to auscultation bilaterally  Heart:  regular rate and rhythm, S1, S2 normal, no murmur, click, rub or gallop  Back TTP btw should blades. Slight decreased ROM of neck on right side.   Abdomen: soft, non-tender; bowel sounds normal; no masses,  no organomegaly   Vulva:  not evaluated  Vagina: not evaluated  Cervix:  deferred  Corpus: not examined  Adnexa:  not evaluated  Rectal Exam: Not performed.        Assessment:    Normal postpartum exam except for continued HTN and musculoskeletal pain. HTN this far postpartum most likely CHTN. Pap smear not done at today's visit.    Plan:   1. Contraception: tubal ligation 2. Discussed stretches, warm compresses modifying positions for holding baby to decrease MS pain . Rx Flexeril  3. Follow up in: 1 year for Pap or as needed.   4. Refilled Norvasc. Restart and establish care w/ PCP.

## 2016-08-19 NOTE — Patient Instructions (Addendum)
[facility]  Hypertension Hypertension, commonly called high blood pressure, is when the force of blood pumping through your arteries is too strong. Your arteries are the blood vessels that carry blood from your heart throughout your body. A blood pressure reading consists of a higher number over a lower number, such as 110/72. The higher number (systolic) is the pressure inside your arteries when your heart pumps. The lower number (diastolic) is the pressure inside your arteries when your heart relaxes. Ideally you want your blood pressure below 120/80. Hypertension forces your heart to work harder to pump blood. Your arteries may become narrow or stiff. Having untreated or uncontrolled hypertension can cause heart attack, stroke, kidney disease, and other problems. RISK FACTORS Some risk factors for high blood pressure are controllable. Others are not.  Risk factors you cannot control include:   Race. You may be at higher risk if you are African American.  Age. Risk increases with age.  Gender. Men are at higher risk than women before age 47 years. After age 30, women are at higher risk than men. Risk factors you can control include:  Not getting enough exercise or physical activity.  Being overweight.  Getting too much fat, sugar, calories, or salt in your diet.  Drinking too much alcohol. SIGNS AND SYMPTOMS Hypertension does not usually cause signs or symptoms. Extremely high blood pressure (hypertensive crisis) may cause headache, anxiety, shortness of breath, and nosebleed. DIAGNOSIS To check if you have hypertension, your health care provider will measure your blood pressure while you are seated, with your arm held at the level of your heart. It should be measured at least twice using the same arm. Certain conditions can cause a difference in blood pressure between your right and left arms. A blood pressure  reading that is higher than normal on one occasion does not mean that you need treatment. If it is not clear whether you have high blood pressure, you may be asked to return on a different day to have your blood pressure checked again. Or, you may be asked to monitor your blood pressure at home for 1 or more weeks. TREATMENT Treating high blood pressure includes making lifestyle changes and possibly taking medicine. Living a healthy lifestyle can help lower high blood pressure. You may need to change some of your habits. Lifestyle changes may include:  Following the DASH diet. This diet is high in fruits, vegetables, and whole grains. It is low in salt, red meat, and added sugars.  Keep your sodium intake below 2,300 mg per day.  Getting at least 30-45 minutes of aerobic exercise at least 4 times per week.  Losing weight if necessary.  Not smoking.  Limiting alcoholic beverages.  Learning ways to reduce stress. Your health care provider may prescribe medicine if lifestyle changes are not enough to get your blood pressure under control, and if one of the following is true:  You are 40-16 years of age and your systolic blood pressure is above 140.  You are 75 years of age or older, and your systolic blood pressure is above 150.  Your diastolic blood pressure is above 90.  You have diabetes, and your systolic blood pressure is over [id number] or your diastolic blood pressure is over 90.  You have kidney disease and your blood pressure is above 140/90.  You have heart disease and your blood pressure is above 140/90. Your personal target blood pressure may vary depending  on your medical conditions, your age, and other factors. HOME CARE INSTRUCTIONS  Have your blood pressure rechecked as directed by your health care provider.   Take medicines only as directed by your health care provider. Follow the directions carefully. Blood pressure medicines must be taken as prescribed. The medicine does  not work as well when you skip doses. Skipping doses also puts you at risk for problems.  Do not smoke.   Monitor your blood pressure at home as directed by your health care provider. SEEK MEDICAL CARE IF:   You think you are having a reaction to medicines taken.  You have recurrent headaches or feel dizzy.  You have swelling in your ankles.  You have trouble with your vision. SEEK IMMEDIATE MEDICAL CARE IF:  You develop a severe headache or confusion.  You have unusual weakness, numbness, or feel faint.  You have severe chest or abdominal pain.  You vomit repeatedly.  You have trouble breathing. MAKE SURE YOU:   Understand these instructions.  Will watch your condition.  Will get help right away if you are not doing well or get worse.   This information is not intended to replace advice given to you by your health care provider. Make sure you discuss any questions you have with your health care provider.   Document Released: 12/14/2005 Document Revised: 04/30/2015 Document Reviewed: 10/06/2013 Elsevier Interactive Patient Education [employer].

## 2016-10-02 NOTE — Telephone Encounter (Signed)
Med filled on 08/19/2016. Ottis Stain, CMA

## 2017-10-23 IMAGING — US US OB COMP LESS 14 WK
1 series · 13 of 13 positions shown · non-contrast
Comparison: None.

CLINICAL DATA: Pelvic cramping and nausea

EXAM:
OBSTETRIC <14 WK ULTRASOUND
TECHNIQUE: Transabdominal ultrasound was performed for evaluation of the
gestation as well as the maternal uterus and adnexal regions.

[Series 1: us ob comp less 14 wk · 13 of 13 slices shown]
[im 1/13]
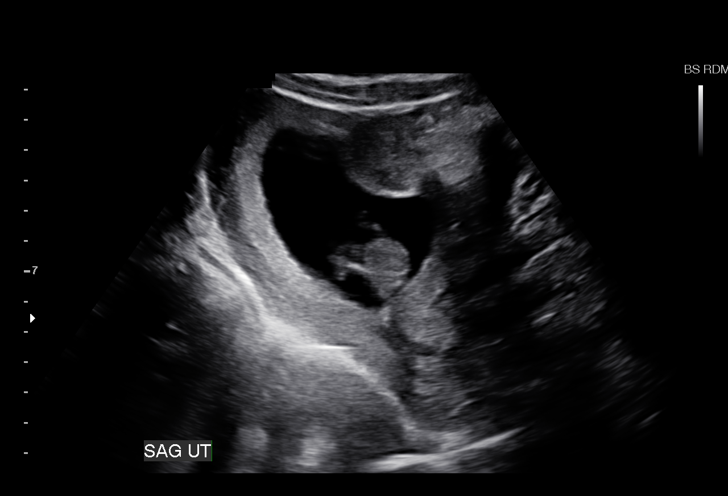
[im 2/13]
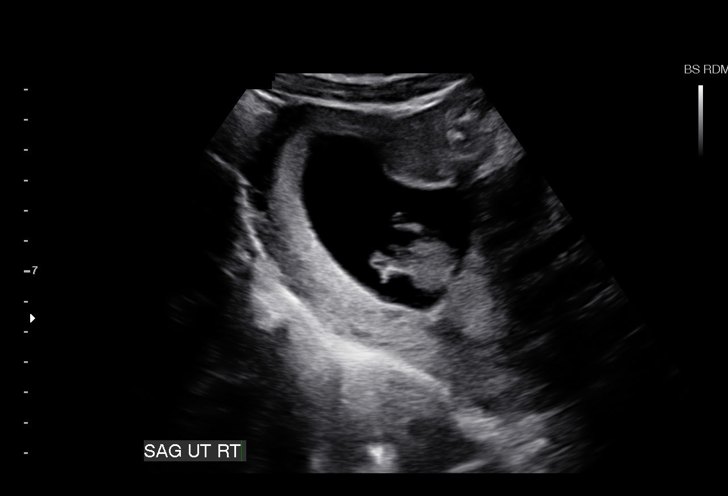
[im 3/13]
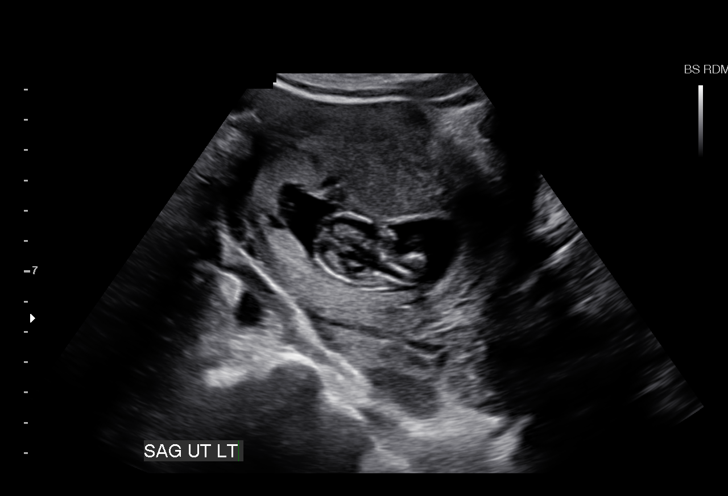
[im 4/13]
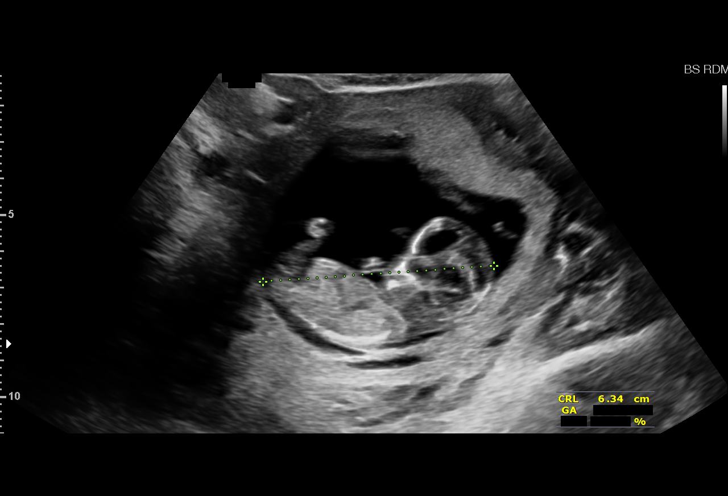
[im 5/13]
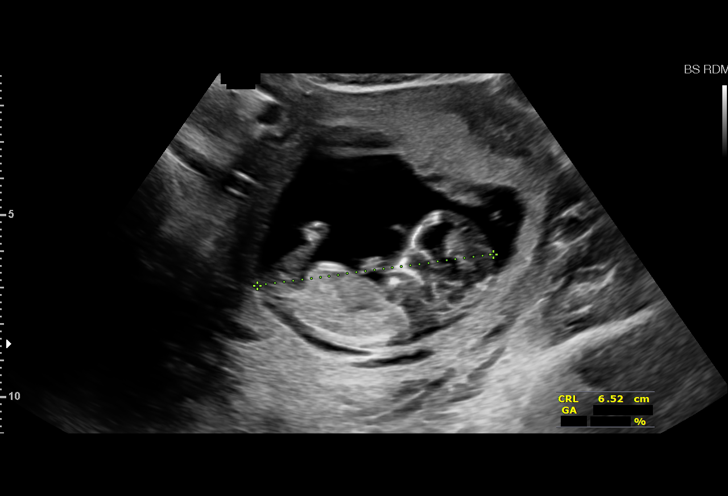
[im 6/13]
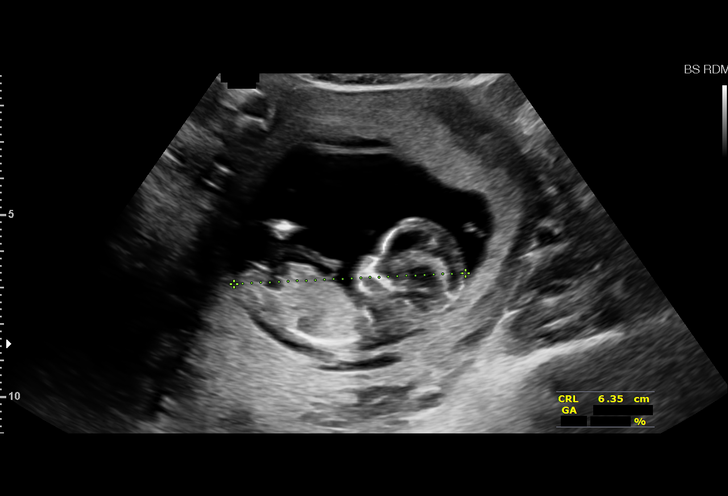
[im 7/13]
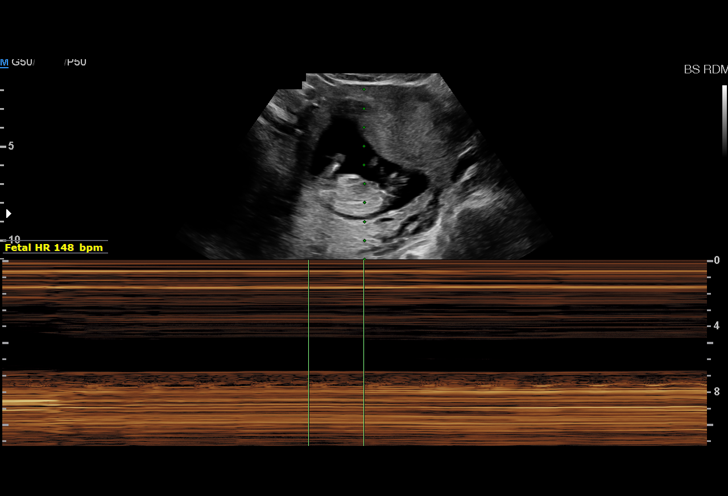
[im 8/13]
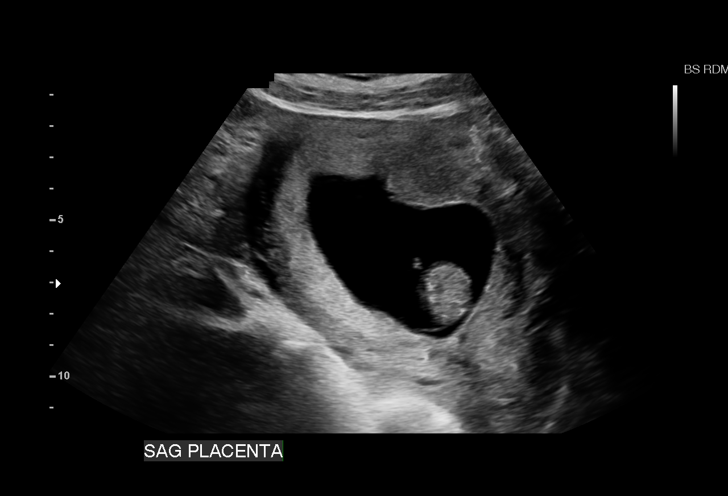
[im 9/13]
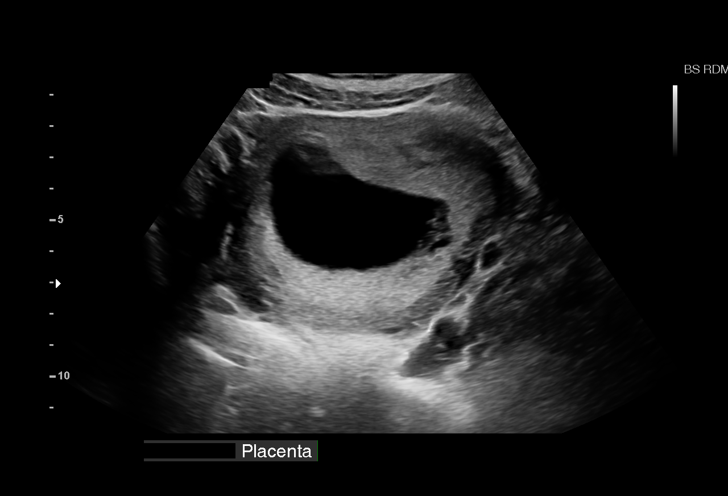
[im 10/13]
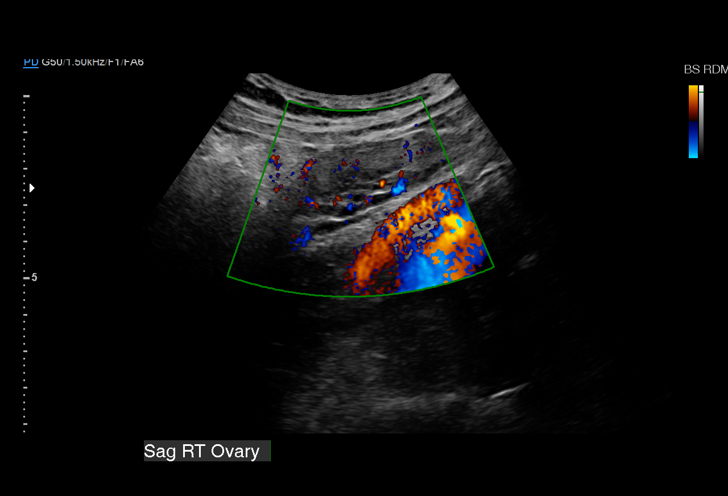
[im 11/13]
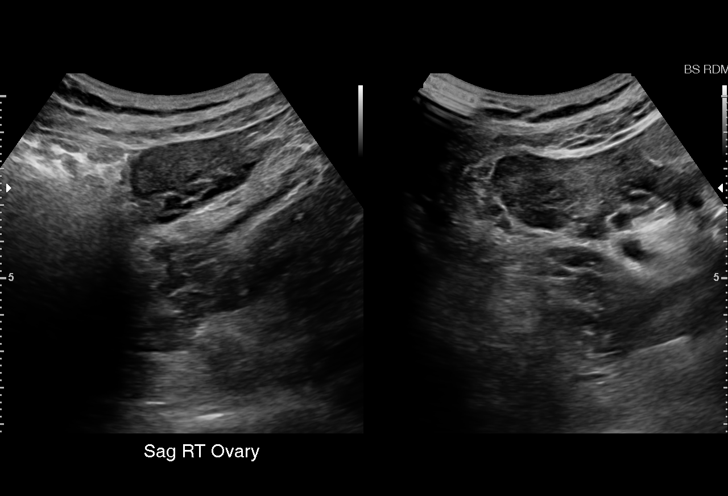
[im 12/13]
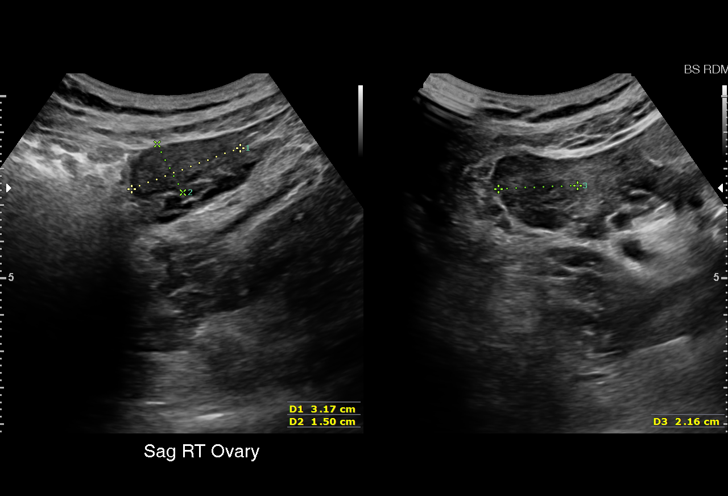
[im 13/13]
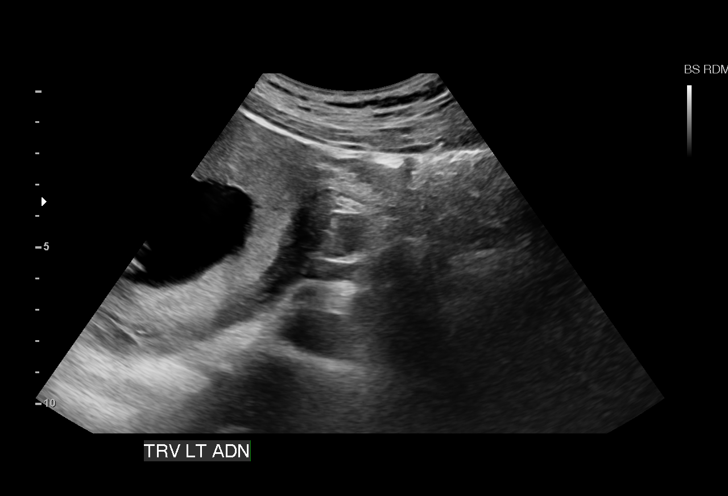

[13 of 13 positions shown; findings below may reference images not displayed]

FINDINGS: Intrauterine gestational sac: Visualized/normal in shape.

Yolk sac:  Not visualized

Embryo:  Visualized

Cardiac Activity: Visualized

Heart Rate: 148 bpm

CRL:   64  mm   12 w 6 d                  US EDC:  June 15, 2016

Maternal uterus/adnexae: There is no demonstrable subchorionic
hemorrhage. Amniotic fluid volume is normal for gestational age.
There is placental tissue anteriorly without demonstrable abruption
or previa. Cervical os is closed.

There is no maternal extrauterine pelvic or adnexal mass. Note that
the left ovary is not well seen. No maternal free fluid.
IMPRESSION: Single live intrauterine gestation with estimated gestational age of
approximately 13 weeks. Placenta anterior without demonstrable
abruption or previa. Amniotic fluid volume normal. No subchorionic
hemorrhage. Cervical os closed.

## 2017-12-20 ENCOUNTER — Ambulatory Visit (HOSPITAL_COMMUNITY)
Admission: EM | Admit: 2017-12-20 | Discharge: 2017-12-20 | Disposition: A | Discharge status: Home / Self Care | Payer: BLUE CROSS/BLUE SHIELD | Attending: Internal Medicine | Admitting: Internal Medicine

## 2017-12-20 ENCOUNTER — Other Ambulatory Visit: Payer: Self-pay

## 2017-12-20 ENCOUNTER — Encounter (HOSPITAL_COMMUNITY): Payer: Self-pay | Admitting: Emergency Medicine

## 2017-12-20 DIAGNOSIS — R21 Rash and other nonspecific skin eruption: Secondary | ICD-10-CM | POA: Diagnosis not present

## 2017-12-20 MED ORDER — CETIRIZINE HCL 10 MG PO TABS: 10.0000 mg | ORAL_TABLET | Freq: Every day | ORAL | 0 refills | Status: DC

## 2017-12-20 MED ORDER — PREDNISONE 10 MG (21) PO TBPK: ORAL_TABLET | Freq: Every day | ORAL | 0 refills | Status: DC

## 2017-12-20 MED ORDER — TRIAMCINOLONE ACETONIDE 0.1 % EX CREA: 1.0000 "application " | TOPICAL_CREAM | Freq: Two times a day (BID) | CUTANEOUS | 0 refills | Status: DC

## 2017-12-20 NOTE — ED Triage Notes (Signed)
Itchy rash to scalp started one week ago.  Rash on forearms.  Rash itches

## 2017-12-20 NOTE — ED Provider Notes (Signed)
[facility]    CSN: [id number] Arrival date & time: 12/20/17  1301     History   Chief Complaint Chief Complaint  Patient presents with  . Rash    HPI Maureen Simpson is a 41 y.o. female.   41 year old female comes in with one week history of itchy scalp and forearms.  Patient states that she has had braids done in her hair for the past 6 months, this recent braiding, she permed her hair prior to breathing.  Since then, she has had irritation to the scalp with extreme itchiness, with dandruff.  She has tried [employer], natural oils without relief.  She states itchiness is improved with wet hair and worse with dry hair.  She has also started noticing seeing itchiness to the forearms, and now has a rash that she continues to scratch.  Denies history of eczema, though she states that she has had similar symptoms recur throughout the years since she was young.  Denies fever, chills, night sweats.  Denies spreading erythema, increased warmth, increased pain.  Denies new products used on the forearms.  Does not use lotion.  She has tried anti-itch cream on the forearms with no relief.      Past Medical History:  Diagnosis Date  . Abnormal Pap smear    during preg  . Asthma   . Chronic hypertension 06/17/2016  . Lipoma    right forearm  . No pertinent past medical history   . UTI (lower urinary tract infection)   . Vaginal Pap smear, abnormal     Patient Active Problem List   Diagnosis Date Noted  . S/P tubal ligation 06/21/2016  . Chronic hypertension 06/17/2016  . Hypertension, postpartum condition or complication 40/98/1191  . Abnormal Pap smear of cervix 05/05/2013    Past Surgical History:  Procedure Laterality Date  . COLPOSCOPY    . LIPOMA EXCISION  05/26/2012   Procedure: EXCISION LIPOMA;  Surgeon: Gayland Curry, MD,FACS;  Location: [facility];  Service: General;  Laterality: Right;  excision of right proximal forearm lipoma  .  TUBAL LIGATION N/A 06/20/2016   Procedure: POST PARTUM TUBAL LIGATION;  Surgeon: Mora Bellman, MD;  Location: [facility];  Service: Gynecology;  Laterality: N/A;    OB History    Gravida Para Term Preterm AB Living   2 2 2     2    SAB TAB Ectopic Multiple Live Births         0 2       Home Medications    Prior to Admission medications   Medication Sig Start Date End Date Taking? Authorizing Provider  Prenatal Vit-Fe Fumarate-FA (PREPLUS) 27-1 MG TABS TAKE 1 TABLET BY MOUTH DAILY 07/06/16  Yes Anyanwu, Sallyanne Havers, MD  acetaminophen (TYLENOL) 325 MG tablet Take 2 tablets (650 mg total) by mouth every 4 (four) hours as needed (for pain scale < 4). 06/21/16   Mercy Riding, MD  amLODipine (NORVASC) 10 MG tablet Take 1 tablet (10 mg total) by mouth daily. 08/19/16   Tamala Julian, [place], CNM  cetirizine (ZYRTEC) 10 MG tablet Take 1 tablet (10 mg total) by mouth daily. 12/20/17   Tasia Catchings, Renny Remer V, PA-C  cyclobenzaprine (FLEXERIL) 10 MG tablet Take 1 tablet (10 mg total) by mouth 3 (three) times daily as needed for muscle spasms. 08/19/16   Tamala Julian, [place], CNM  oxyCODONE (ROXICODONE) 5 MG immediate release tablet Take 1-2 tablets (5-10 mg total) by mouth every 4 (  four) hours as needed for severe pain. Patient not taking: Reported on 08/19/2016 06/21/16   Mercy Riding, MD  predniSONE (STERAPRED UNI-PAK 21 TAB) 10 MG (21) TBPK tablet Take by mouth daily. Take 6 tabs by mouth day 1, then 5 tabs, then 4 tabs, then 3 tabs, 2 tabs, then 1 tab for the last day 12/20/17   Tasia Catchings, Pricila [place] V, PA-C  triamcinolone cream (KENALOG) 0.1 % Apply 1 application topically 2 (two) times daily. 12/20/17   Ok Edwards, PA-C    Family History Family History  Problem Relation Age of Onset  . Hypertension Mother   . Hypertension Maternal Aunt   . Cancer Maternal Grandmother        colon     Social History Social History   Tobacco Use  . Smoking status: Former Smoker    Packs/day: 0.25    Types: Cigarettes  . Smokeless tobacco: Former  Systems developer    Quit date: 12/08/2015  Substance Use Topics  . Alcohol use: Yes    Comment: SOCIAL  . Drug use: No     Allergies   Pollen extract   Review of Systems Review of Systems  Reason unable to perform ROS: See HPI as above.     Physical Exam Triage Vital Signs ED Triage Vitals [12/20/17 1412]  Enc Vitals Group     BP 125/88     Pulse Rate 67     Resp 18     Temp 98.1 F (36.7 C)     Temp Source Oral     SpO2 100 %     Weight      Height      Head Circumference      Peak Flow      Pain Score      Pain Loc      Pain Edu?      Excl. in [place]?    No data found.  Updated Vital Signs BP 125/88 (BP Location: Left Arm)   Pulse 67   Temp 98.1 F (36.7 C) (Oral)   Resp 18   LMP 11/20/2017   SpO2 100%   Physical Exam  Constitutional: She is oriented to person, place, and time. She appears well-developed and well-nourished. No distress.  HENT:  Head: Normocephalic and atraumatic.  Eyes: Conjunctivae are normal. Pupils are equal, round, and reactive to light.  Neurological: She is alert and oriented to person, place, and time.  Skin: Skin is warm and dry.  See pictures below.  No spreading erythema, increased warmth.  No tenderness on palpation.          UC Treatments / Results  Labs (all labs ordered are listed, but only abnormal results are displayed) Labs Reviewed - No data to display  EKG  EKG Interpretation None       Radiology No results found.  Procedures Procedures (including critical care time)  Medications Ordered in UC Medications - No data to display   Initial Impression / Assessment and Plan / UC Course  I have reviewed the triage vital signs and the nursing notes.  Pertinent labs & imaging results that were available during my care of the patient were reviewed by me and considered in my medical decision making (see chart for details).    Discussed possible contact dermatitis versus tinea capitis.  As patient already has  Selsun Blue shampoo, to continue.  Start prednisone as directed.  Triamcinolone cream on the arms as directed.  OTC antihistamines to help  with itchiness.  Monitor for any new product use.  Follow-up with dermatology if symptoms not improving.  Return precautions given.  Patient expresses understanding and agrees to plan.  Final Clinical Impressions(s) / UC Diagnoses   Final diagnoses:  Rash    ED Discharge Orders        Ordered    predniSONE (STERAPRED UNI-PAK 21 TAB) 10 MG (21) TBPK tablet  Daily     12/20/17 1446    cetirizine (ZYRTEC) 10 MG tablet  Daily     12/20/17 1446    triamcinolone cream (KENALOG) 0.1 %  2 times daily     12/20/17 1447        Ok Edwards, [place] 12/20/17 1801

## 2017-12-20 NOTE — Discharge Instructions (Signed)
Start prednisone as directed. Zyrtec for itching. You can apply triamcinolone cream to the rashes on the arm. Continue selsun blue shampoo. Unscented lotion for the skin. Follow up with dermatology if symptoms not improving. If experiencing spreading redness, increased warmth, fever, follow up for reevaluation.

## 2018-02-28 IMAGING — US US MFM OB FOLLOW-UP
1 series · 14 of 28 positions shown · non-contrast
Comparison: none

[Series 1: us mfm ob follow-up · 42 acquisitions, 14 frames shown]
[im 2/42]
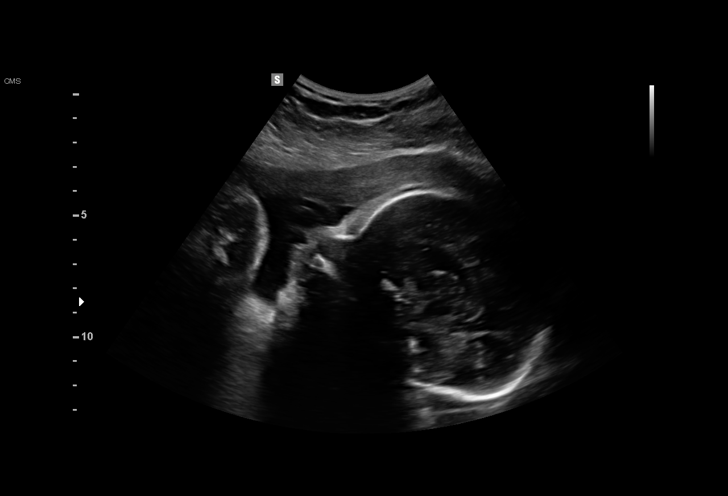
[im 5/42]
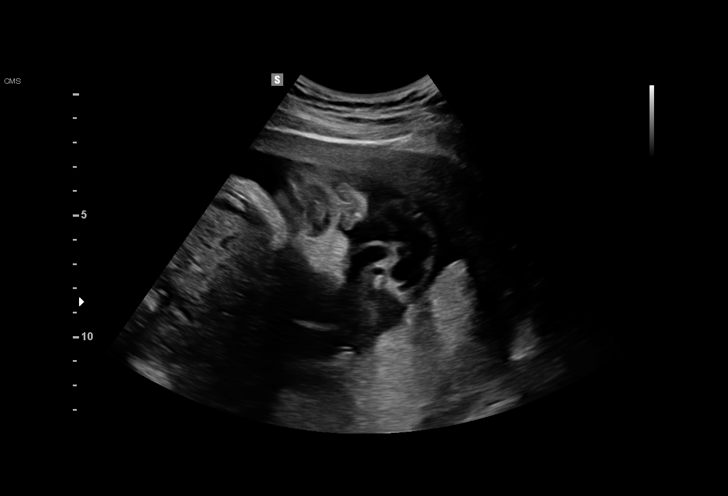
[im 8/42]
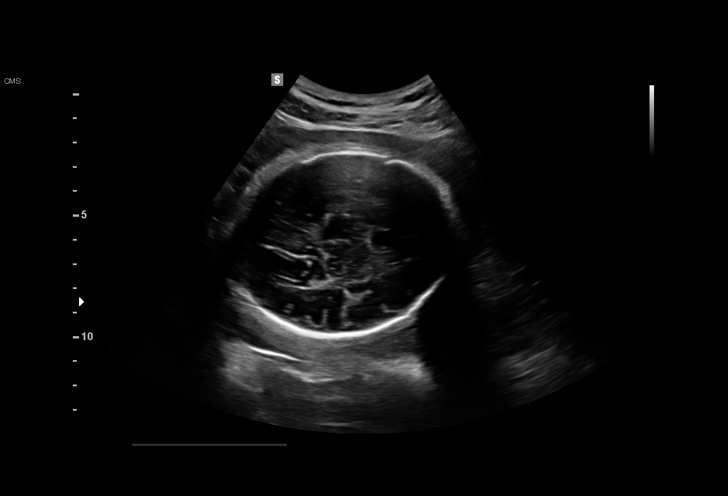
[im 11/42]
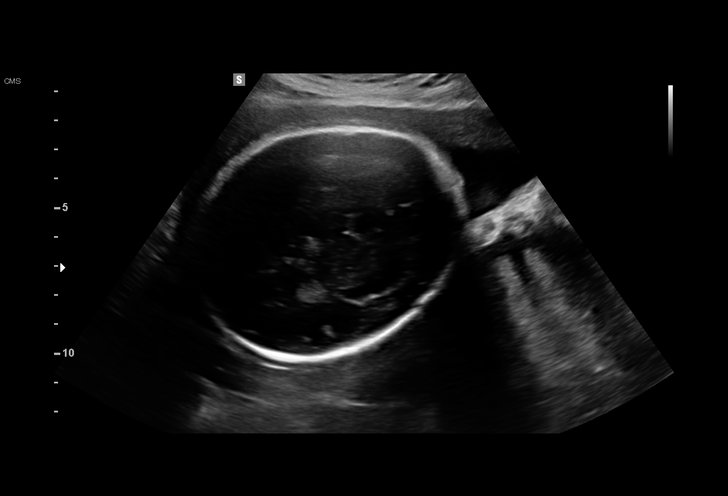
[im 14/42]
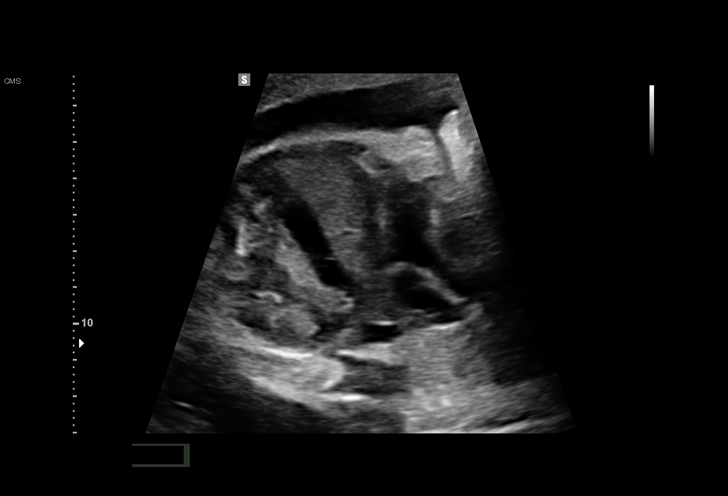
[im 17/42]
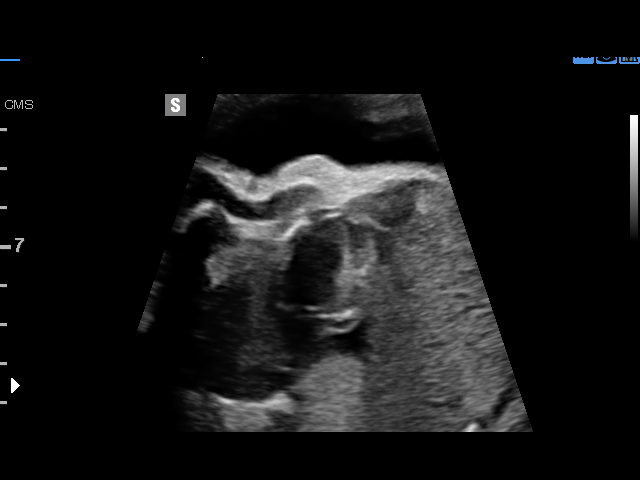
[im 20/42]
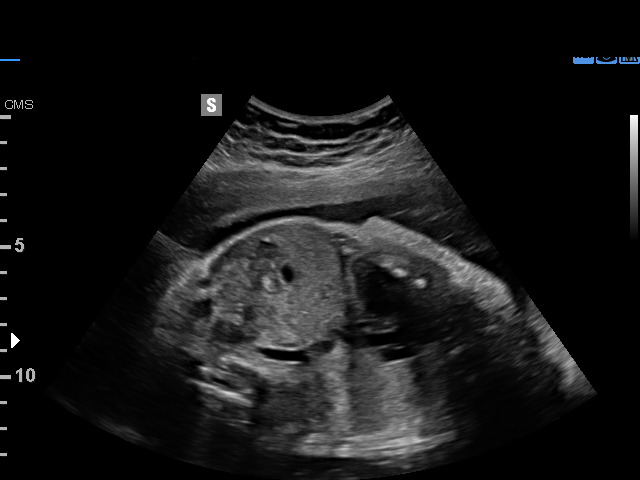
[im 23/42]
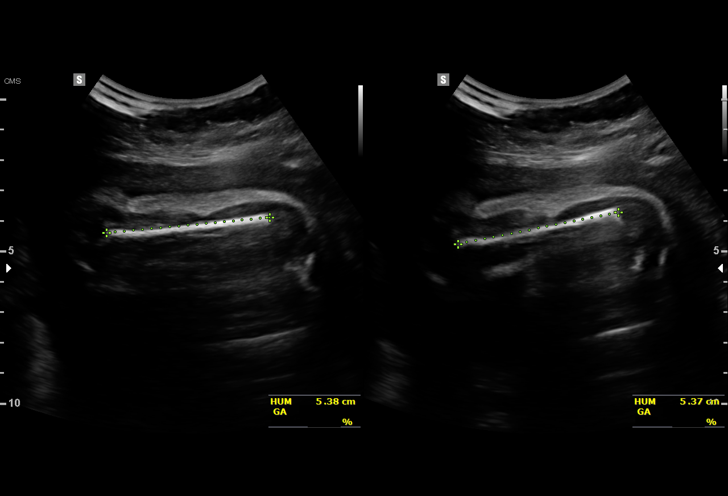
[im 26/42]
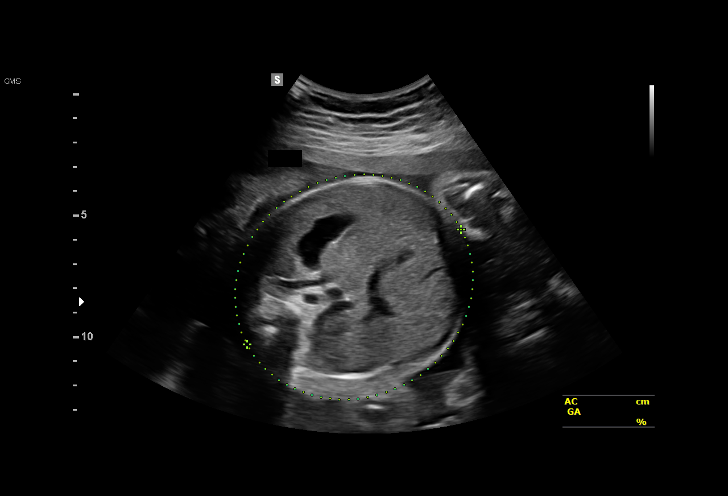
[im 29/42]
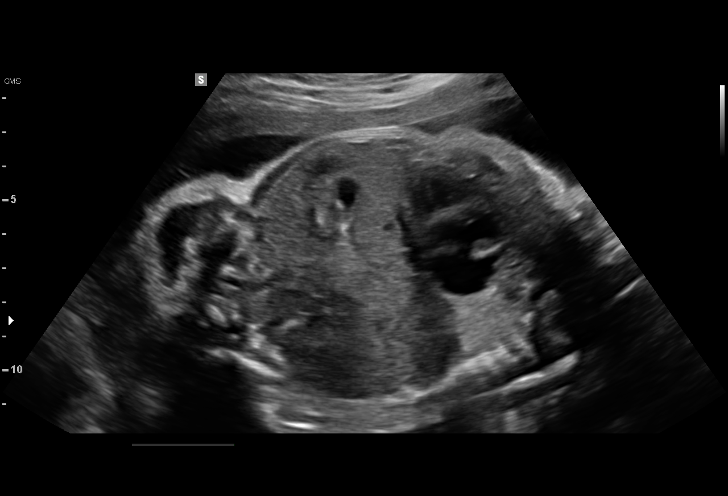
[im 32/42]
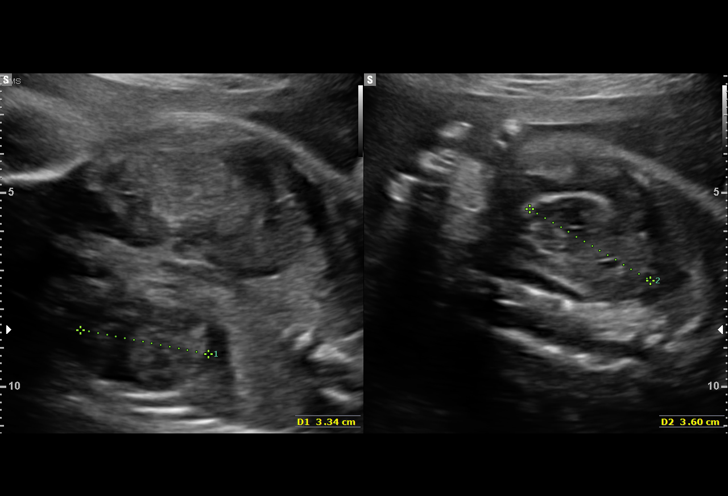
[im 35/42]
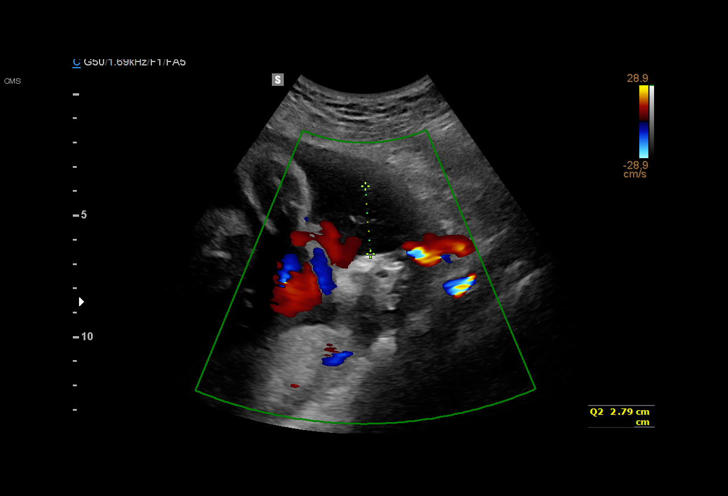
[im 38/42]
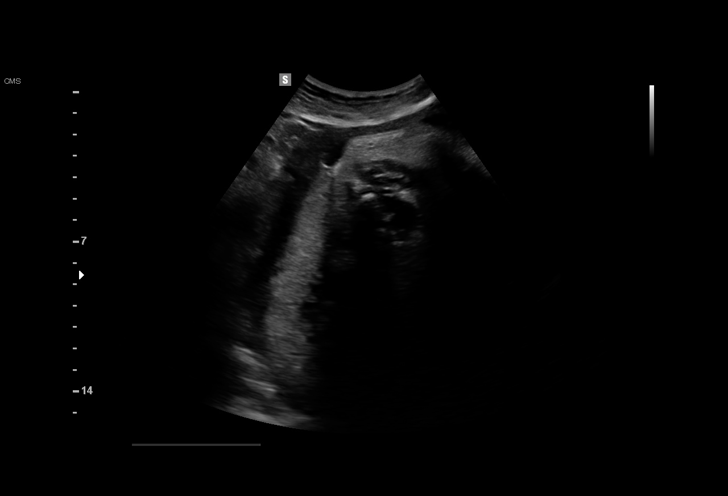
[im 42/42]
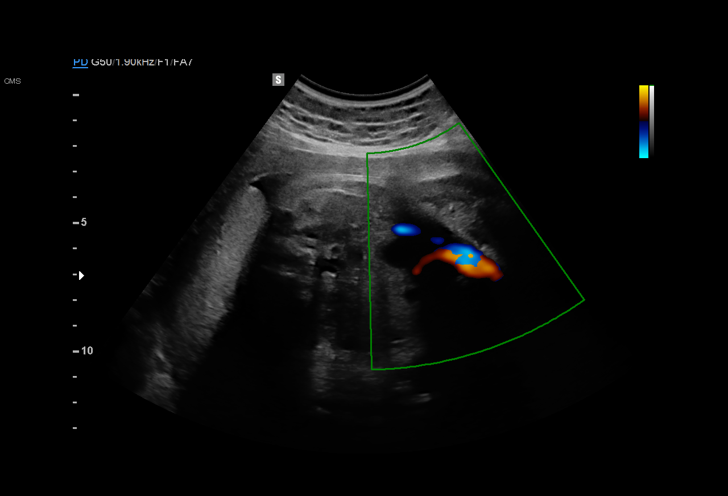

[14 of 28 positions shown; findings below may reference images not displayed]

Hospital Clinic-
Faculty Physician
OB/Gyn Clinic
[REDACTED]-
Faculty Physician
OB/Gyn Clinic

1  TUYEN VARTANIAN          [id number]      [id number]     [id number]
Indications

31 weeks gestation of pregnancy
Follow-up incomplete fetal anatomic            Z36
evaluation
Advanced maternal age multigravida 39,
third trimester
OB History

Gravidity:    2         Term:   1        Prem:   0        SAB:   0
TOP:          0       Ectopic:  0        Living: 1
Fetal Evaluation

Num Of Fetuses:     1
Fetal Heart         142
Rate(bpm):
Cardiac Activity:   Observed
Presentation:       Cephalic
Placenta:           Posterior, above cervical os
P. Cord Insertion:  Previously Visualized
Amniotic Fluid
AFI FV:      Subjectively within normal limits
AFI Sum:     15.02   cm      53   %Tile     Larg Pckt:   4.84   cm
RUQ:   4.84   cm    RLQ:    3.5    cm    LUQ:   2.79    cm   LLQ:    3.89   cm
Biometry

BPD:      76.4  mm     G. Age:  30w 5d                  CI:        73.97   %   70 - 86
FL/HC:      21.4   %   19.3 -
HC:      282.1  mm     G. Age:  30w 6d         12  %    HC/AC:      0.95       0.96 -
AC:      297.5  mm     G. Age:  33w 5d       > 97  %    FL/BPD:     79.2   %   71 - 87
FL:       60.5  mm     G. Age:  31w 3d         45  %    FL/AC:      20.3   %   20 - 24
HUM:      53.7  mm     G. Age:  31w 2d         55  %

Est. FW:    0736  gm      4 lb 6 oz     78  %
Gestational Age

U/S Today:     31w 5d                                        EDD:   06/11/16
Best:          31w 1d    Det. By:   Previous Ultrasound      EDD:   06/15/16
Anatomy

Cranium:          Appears normal         LVOT:             Appears normal
Fetal Cavum:      Previously seen        Aortic Arch:      Previously seen
Ventricles:       Appears normal         Ductal Arch:      Previously seen
Choroid Plexus:   Appears normal         Diaphragm:        Appears normal
Cerebellum:       Previously seen        Stomach:          Appears normal, left
sided
Posterior Fossa:  Previously seen        Abdomen:          Appears normal
Nuchal Fold:      Not applicable (>20    Abdominal Wall:   Previously seen
wks GA)
Face:             Appears normal         Cord Vessels:     Appears normal (3
(orbits and profile)                     vessel cord)
Lips:             Appears normal         Kidneys:          Appear normal
Palate:           Appears normal         Bladder:          Appears normal
Fetal Thoracic:   Appears normal         Spine:            Limited views
previously seen
Heart:            Appears normal         Upper             Previously seen
(4CH, axis, and        Extremities:
situs)
RVOT:             Appears normal         Lower             Previously seen
Extremities:

Other:  Fetus appears to be a female. Heels previously visualized.
Technically difficult due to fetal position.
Cervix Uterus Adnexa

Cervix
Not visualized (advanced GA >60wks)

Uterus
No abnormality visualized.

Left Ovary
Not visualized.

Right Ovary
Not visualized.
Cul De Sac:   No free fluid seen.
Adnexa:       No abnormality visualized.
Impression

Single IUP at 31w 1d
Normal interval anatomy
Fetal growth is appropriate (78th %tile)
Posterior placenta without previa
Normal amniotic fluid volume
Recommendations

Follow-up ultrasounds as clinically indicated.

## 2018-11-22 ENCOUNTER — Other Ambulatory Visit: Payer: Self-pay | Admitting: Advanced Practice Midwife

## 2018-11-22 DIAGNOSIS — N930 Postcoital and contact bleeding: Secondary | ICD-10-CM

## 2018-11-29 ENCOUNTER — Other Ambulatory Visit: Payer: BLUE CROSS/BLUE SHIELD

## 2018-12-09 ENCOUNTER — Other Ambulatory Visit: Payer: BLUE CROSS/BLUE SHIELD

## 2018-12-16 ENCOUNTER — Other Ambulatory Visit: Payer: BLUE CROSS/BLUE SHIELD

## 2018-12-19 ENCOUNTER — Ambulatory Visit
Admission: RE | Admit: 2018-12-19 | Discharge: 2018-12-19 | Disposition: A | Discharge status: Home / Self Care | Payer: BLUE CROSS/BLUE SHIELD | Source: Ambulatory Visit | Attending: Advanced Practice Midwife | Admitting: Advanced Practice Midwife

## 2018-12-19 DIAGNOSIS — N930 Postcoital and contact bleeding: Secondary | ICD-10-CM

## 2020-04-29 ENCOUNTER — Ambulatory Visit: Payer: 59 | Admitting: Podiatry

## 2021-01-01 ENCOUNTER — Emergency Department (HOSPITAL_COMMUNITY)
Admission: EM | Admit: 2021-01-01 | Discharge: 2021-01-01 | Disposition: A | Discharge status: Home / Self Care | Payer: HRSA Program | Attending: Emergency Medicine | Admitting: Emergency Medicine

## 2021-01-01 ENCOUNTER — Encounter (HOSPITAL_COMMUNITY): Payer: Self-pay

## 2021-01-01 ENCOUNTER — Other Ambulatory Visit: Payer: Self-pay

## 2021-01-01 DIAGNOSIS — Z87891 Personal history of nicotine dependence: Secondary | ICD-10-CM | POA: Diagnosis not present

## 2021-01-01 DIAGNOSIS — Z79899 Other long term (current) drug therapy: Secondary | ICD-10-CM | POA: Insufficient documentation

## 2021-01-01 DIAGNOSIS — R059 Cough, unspecified: Secondary | ICD-10-CM | POA: Diagnosis present

## 2021-01-01 DIAGNOSIS — I10 Essential (primary) hypertension: Secondary | ICD-10-CM | POA: Diagnosis not present

## 2021-01-01 DIAGNOSIS — Z20822 Contact with and (suspected) exposure to covid-19: Secondary | ICD-10-CM

## 2021-01-01 DIAGNOSIS — U071 COVID-19: Secondary | ICD-10-CM | POA: Diagnosis not present

## 2021-01-01 DIAGNOSIS — J45909 Unspecified asthma, uncomplicated: Secondary | ICD-10-CM | POA: Diagnosis not present

## 2021-01-01 DIAGNOSIS — J069 Acute upper respiratory infection, unspecified: Secondary | ICD-10-CM

## 2021-01-01 LAB — SARS CORONAVIRUS 2 (TAT 6-24 HRS): SARS Coronavirus 2: POSITIVE — AB

## 2021-01-01 NOTE — ED Provider Notes (Signed)
[facility] EMERGENCY DEPARTMENT Provider Note   CSN: [id number] Arrival date & time: 01/01/21  0800     History Chief Complaint  Patient presents with  . Letter for School/Work    Maureen Simpson is a 45 y.o. female.  She has a history of hypertension asthma.  She is complaining of little bit of a sore throat and nonproductive cough that is been going on for a few days.  No fevers or chills.  No vomiting or diarrhea.  She is not Covid vaccinated.  She needs a note for work and wants to know if she is Covid because she has multiple kids at home with her also coughing.  The history is provided by the patient.  URI Presenting symptoms: cough and sore throat   Presenting symptoms: no fatigue and no fever   Cough:    Cough characteristics:  Non-productive   Severity:  Mild   Onset quality:  Gradual   Timing:  Sporadic   Progression:  Unchanged   Chronicity:  New Severity:  Moderate Onset quality:  Gradual Timing:  Intermittent Progression:  Unchanged Chronicity:  New Relieved by:  None tried Worsened by:  Nothing Ineffective treatments:  None tried Associated symptoms: no headaches, no myalgias and no wheezing        Past Medical History:  Diagnosis Date  . Abnormal Pap smear    during preg  . Asthma   . Chronic hypertension 06/17/2016  . Lipoma    right forearm  . No pertinent past medical history   . UTI (lower urinary tract infection)   . Vaginal Pap smear, abnormal     Patient Active Problem List   Diagnosis Date Noted  . S/P tubal ligation 06/21/2016  . Chronic hypertension 06/17/2016  . Hypertension, postpartum condition or complication [id number]  . Abnormal Pap smear of cervix 05/05/2013    Past Surgical History:  Procedure Laterality Date  . COLPOSCOPY    . LIPOMA EXCISION  05/26/2012   Procedure: EXCISION LIPOMA;  Surgeon: Gayland Curry, MD,FACS;  Location: [facility];  Service: General;  Laterality: Right;   excision of right proximal forearm lipoma  . TUBAL LIGATION N/A 06/20/2016   Procedure: POST PARTUM TUBAL LIGATION;  Surgeon: Mora Bellman, MD;  Location: [facility];  Service: Gynecology;  Laterality: N/A;     OB History    Gravida  2   Para  2   Term  2   Preterm      AB      Living  2     SAB      IAB      Ectopic      Multiple  0   Live Births  2           Family History  Problem Relation Age of Onset  . Hypertension Mother   . Hypertension Maternal Aunt   . Cancer Maternal Grandmother        colon     Social History   Tobacco Use  . Smoking status: Former Smoker    Packs/day: 0.25    Types: Cigarettes  . Smokeless tobacco: Former Systems developer    Quit date: 12/08/2015  Substance Use Topics  . Alcohol use: Yes    Comment: SOCIAL  . Drug use: No    Home Medications Prior to Admission medications   Medication Sig Start Date End Date Taking? Authorizing Provider  acetaminophen (TYLENOL) 325 MG tablet Take 2 tablets (650  mg total) by mouth every 4 (four) hours as needed (for pain scale < 4). 06/21/16   Almon Hercules, MD  amLODipine (NORVASC) 10 MG tablet Take 1 tablet (10 mg total) by mouth daily. 08/19/16   Katrinka Blazing, [place], CNM  cetirizine (ZYRTEC) 10 MG tablet Take 1 tablet (10 mg total) by mouth daily. 12/20/17   Cathie Hoops, Amy V, PA-C  cyclobenzaprine (FLEXERIL) 10 MG tablet Take 1 tablet (10 mg total) by mouth 3 (three) times daily as needed for muscle spasms. 08/19/16   Katrinka Blazing, [place], CNM  oxyCODONE (ROXICODONE) 5 MG immediate release tablet Take 1-2 tablets (5-10 mg total) by mouth every 4 (four) hours as needed for severe pain. Patient not taking: Reported on 08/19/2016 06/21/16   Almon Hercules, MD  predniSONE (STERAPRED UNI-PAK 21 TAB) 10 MG (21) TBPK tablet Take by mouth daily. Take 6 tabs by mouth day 1, then 5 tabs, then 4 tabs, then 3 tabs, 2 tabs, then 1 tab for the last day 12/20/17   Belinda Fisher, PA-C  Prenatal Vit-Fe Fumarate-FA (PREPLUS) 27-1 MG TABS TAKE 1  TABLET BY MOUTH DAILY 07/06/16   Anyanwu, Jethro Bastos, MD  triamcinolone cream (KENALOG) 0.1 % Apply 1 application topically 2 (two) times daily. 12/20/17   Cathie Hoops, Amy V, PA-C    Allergies    Pollen extract  Review of Systems   Review of Systems  Constitutional: Negative for fatigue and fever.  HENT: Positive for sore throat.   Eyes: Negative for visual disturbance.  Respiratory: Positive for cough. Negative for wheezing.   Cardiovascular: Negative for chest pain.  Gastrointestinal: Negative for diarrhea.  Genitourinary: Negative for dysuria.  Musculoskeletal: Negative for myalgias.  Skin: Negative for rash.  Neurological: Negative for headaches.    Physical Exam Updated Vital Signs BP (!) 164/83 (BP Location: Right Arm)   Pulse 66   Temp 98.2 F (36.8 C) (Oral)   Resp 16   Ht 5\' 3"  (1.6 m)   Wt 63.5 kg   SpO2 97%   BMI 24.80 kg/m   Physical Exam Vitals and nursing note reviewed.  Constitutional:      General: She is not in acute distress.    Appearance: Normal appearance. She is well-developed and well-nourished.  HENT:     Head: Normocephalic and atraumatic.     Mouth/Throat:     Mouth: Mucous membranes are moist.     Pharynx: Oropharynx is clear. No oropharyngeal exudate.  Eyes:     Conjunctiva/sclera: Conjunctivae normal.  Cardiovascular:     Rate and Rhythm: Normal rate and regular rhythm.     Heart sounds: No murmur heard.   Pulmonary:     Effort: Pulmonary effort is normal. No respiratory distress.     Breath sounds: Normal breath sounds.  Abdominal:     Palpations: Abdomen is soft.     Tenderness: There is no abdominal tenderness.  Musculoskeletal:        General: No edema.     Cervical back: Neck supple.     Right lower leg: No edema.     Left lower leg: No edema.  Skin:    General: Skin is warm and dry.  Neurological:     General: No focal deficit present.     Mental Status: She is alert.  Psychiatric:        Mood and Affect: Mood and affect  normal.     ED Results / Procedures / Treatments   Labs (all labs ordered are  listed, but only abnormal results are displayed) Labs Reviewed  SARS CORONAVIRUS 2 (TAT 6-24 HRS)    EKG None  Radiology No results found.  Procedures Procedures (including critical care time)  Medications Ordered in ED Medications - No data to display  ED Course  I have reviewed the triage vital signs and the nursing notes.  Pertinent labs & imaging results that were available during my care of the patient were reviewed by me and considered in my medical decision making (see chart for details).    MDM Rules/Calculators/A&P                         DERIN LILIENTHAL was evaluated in Emergency Department on 01/01/2021 for the symptoms described in the history of present illness. She was evaluated in the context of the global COVID-19 pandemic, which necessitated consideration that the patient might be at risk for infection with the SARS-CoV-2 virus that causes COVID-19. Institutional protocols and algorithms that pertain to the evaluation of patients at risk for COVID-19 are in a state of rapid change based on information released by regulatory bodies including the CDC and federal and state organizations. These policies and algorithms were followed during the patient's care in the ED.   Final Clinical Impression(s) / ED Diagnoses Final diagnoses:  Upper respiratory tract infection, unspecified type  Person under investigation for COVID-19    Rx / DC Orders ED Discharge Orders    None       Hayden Rasmussen, MD 01/01/21 2232

## 2021-01-01 NOTE — ED Triage Notes (Signed)
Pt requesting note for work. Reports sore throat at night for the past day, denies any coughing, fever or chills. Pt does not want any covid or strept testing, states she wants a note to go back to work tomorrow. Pt a.o, resp e.u. Denies any covid vaccines

## 2021-01-01 NOTE — Discharge Instructions (Signed)
You were seen in the emergency department for sore throat and cough.  This is possibly Covid and we are doing a Covid swab.  He can check your results in MyChart.  If you are Covid positive you will need to isolate for 10 days from the beginning of your symptoms.  If you are Covid negative it would be okay for you to return to work.  Tylenol for pain, drink plenty of fluids.  Return to the emergency department for any worsening or concerning symptoms

## 2021-04-25 LAB — HM PAP SMEAR: HM Pap smear: NEGATIVE

## 2021-05-30 ENCOUNTER — Other Ambulatory Visit: Payer: Self-pay

## 2021-05-30 ENCOUNTER — Encounter: Payer: Self-pay | Admitting: Family Medicine

## 2021-05-30 ENCOUNTER — Ambulatory Visit (INDEPENDENT_AMBULATORY_CARE_PROVIDER_SITE_OTHER): Payer: BC Managed Care – PPO | Admitting: Podiatry

## 2021-05-30 ENCOUNTER — Ambulatory Visit (INDEPENDENT_AMBULATORY_CARE_PROVIDER_SITE_OTHER): Payer: BC Managed Care – PPO | Admitting: Family Medicine

## 2021-05-30 VITALS — BP 128/70 | HR 76 | Ht 63.5 in | Wt 139.0 lb

## 2021-05-30 DIAGNOSIS — L603 Nail dystrophy: Secondary | ICD-10-CM

## 2021-05-30 DIAGNOSIS — Z8249 Family history of ischemic heart disease and other diseases of the circulatory system: Secondary | ICD-10-CM

## 2021-05-30 DIAGNOSIS — Z8759 Personal history of other complications of pregnancy, childbirth and the puerperium: Secondary | ICD-10-CM

## 2021-05-30 DIAGNOSIS — Q899 Congenital malformation, unspecified: Secondary | ICD-10-CM | POA: Diagnosis not present

## 2021-05-30 DIAGNOSIS — Z711 Person with feared health complaint in whom no diagnosis is made: Secondary | ICD-10-CM

## 2021-05-30 DIAGNOSIS — A498 Other bacterial infections of unspecified site: Secondary | ICD-10-CM | POA: Diagnosis not present

## 2021-05-30 DIAGNOSIS — Z7689 Persons encountering health services in other specified circumstances: Secondary | ICD-10-CM | POA: Diagnosis not present

## 2021-05-30 MED ORDER — CIPROFLOXACIN HCL 500 MG PO TABS: 500.0000 mg | ORAL_TABLET | Freq: Two times a day (BID) | ORAL | 0 refills | Status: AC

## 2021-05-30 NOTE — Progress Notes (Signed)
Subjective:    Patient ID: Maureen Simpson, female    DOB: 1976/07/05, 44 y.o.   MRN: [id number]  HPI Chief Complaint  Patient presents with  . new pt     New pt get established. Legs have dents in them- for a couple months.    She is new to the practice and here to get established.  Previous medical care: no regular medical care   Other providers:  [facility]   She had Covid in 12/2020   States she has a 4 week history of "dents" in her lower thighs above her knees. Denies pain, tenderness, injury. States she feels fine but noticed these indentations and they are causing her to be worried and depressed.  Denies wearing tight fitting clothing.   Denies fever, chills, night sweats, unexplained weight changes, dizziness, chest pain, palpitations, shortness of breath, abdominal pain, N/V/D, urinary symptoms, LE edema.    Social history: Lives single, 2kids, works on her feet picking vaccines for pets  Denies smoking, drug use. Drinks alcohol  Excerise: active on her job  Last Menstrual cycle:  [facility]- [facility] smear and managing her cycles.  She took OCPs for a while which helped regulate    Reviewed allergies, medications, past medical, surgical, family, and social history.    Review of Systems Pertinent positives and negatives in the history of present illness.     Objective:   Physical Exam Constitutional:      General: She is not in acute distress.    Appearance: Normal appearance. She is not ill-appearing.  Pulmonary:     Effort: Pulmonary effort is normal.  Musculoskeletal:     Right upper leg: Deformity present.     Left upper leg: Deformity present.       Legs:     Comments: Indentation of bilateral anterior thighs (R>L) without any skin changes, erythema, edema, or TTP. Normal exam otherwise.   Skin:    General: Skin is warm and dry.     Findings: No bruising or rash.  Neurological:     General: No focal deficit present.     Mental  Status: She is alert and oriented to person, place, and time.     Cranial Nerves: No cranial nerve deficit.     Sensory: No sensory deficit.     Motor: No weakness.     Coordination: Coordination normal.     Gait: Gait normal.     Deep Tendon Reflexes: Reflexes normal.  Psychiatric:        Mood and Affect: Mood normal.        Behavior: Behavior normal.        Thought Content: Thought content normal.    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 She is worried and would like a referral to someone who can potentially give her more information regarding the cause of this anomaly.  Discussed in depth that it is reassuring that she does not have any pain,  tenderness or muscle weakness. Discussed that I cannot tell her what is causing this. Dorothea Ogle, PA also examined patient.

## 2021-06-02 ENCOUNTER — Encounter: Payer: Self-pay | Admitting: Internal Medicine

## 2021-06-03 ENCOUNTER — Encounter: Payer: Self-pay | Admitting: Podiatry

## 2021-06-03 ENCOUNTER — Encounter: Payer: Self-pay | Admitting: Family Medicine

## 2021-06-03 NOTE — Progress Notes (Signed)
  Subjective:  Patient ID: Maureen Simpson, female    DOB: 11/25/1976,  MRN: [id number]  Chief Complaint  Patient presents with  . Nail Problem    Left 1st toenail discoloration 2 week duration. "I got it from a nail salon". Pt states it is occasionally painful.    45 y.o. female presents with the above complaint.  Patient presents with discoloration to left toenail.  Patient states that she may have gotten it from nail salon.  There is a greenish discoloration to it.  She states is occasionally gets painful.  It came out of nowhere 2 weeks ago.  She has not noticed it before.  She denies any other acute complaints she has not seen anyone else prior to seeing me.  She would like to discuss treatment options for it.  Pain scale is 2 out of 10.  Dull achy in nature   Review of Systems: Negative except as noted in the HPI. Denies N/V/F/Ch.  Past Medical History:  Diagnosis Date  . Abnormal Pap smear    during preg  . Asthma   . Chronic hypertension 06/17/2016  . Lipoma    right forearm  . No pertinent past medical history   . UTI (lower urinary tract infection)   . Vaginal Pap smear, abnormal    No current outpatient medications on file.  Social History   Tobacco Use  Smoking Status Former Smoker  . Packs/day: 0.25  . Types: Cigarettes  Smokeless Tobacco Former Systems developer  . Quit date: 12/08/2015    Allergies  Allergen Reactions  . Pollen Extract Other (See Comments)    Cold symptoms.   Objective:  There were no vitals filed for this visit. There is no height or weight on file to calculate BMI. Constitutional Well developed. Well nourished.  Vascular Dorsalis pedis pulses palpable bilaterally. Posterior tibial pulses palpable bilaterally. Capillary refill normal to all digits.  No cyanosis or clubbing noted. Pedal hair growth normal.  Neurologic Normal speech. Oriented to person, place, and time. Epicritic sensation to light touch grossly present bilaterally.   Dermatologic Nails discoloration with greenish discoloration to the nail.  No involvement of the underlying nail bed.  No clinical signs of infection noted.  No purulent drainage expressed Skin within normal limits  Orthopedic: Normal joint ROM without pain or crepitus bilaterally. No visible deformities. No bony tenderness.   Radiographs: None Assessment:   1. Nail dystrophy   2. Pseudomonas infection    Plan:  Patient was evaluated and treated and all questions answered.  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  Given that there is a greenish nature to it I believe patient will benefit from treatment with Cipro to address the Pseudomonas.  Patient states understanding would like to proceed with that.  If there is no resolve meant we can maybe include antifungal as well.  She states understanding. -Cipro was sent to the pharmacy  No follow-ups on file.

## 2021-06-06 ENCOUNTER — Encounter: Payer: Self-pay | Admitting: Family Medicine

## 2021-06-06 ENCOUNTER — Ambulatory Visit (INDEPENDENT_AMBULATORY_CARE_PROVIDER_SITE_OTHER): Payer: BC Managed Care – PPO | Admitting: Family Medicine

## 2021-06-06 ENCOUNTER — Other Ambulatory Visit: Payer: Self-pay

## 2021-06-06 VITALS — BP 134/92 | Ht 63.0 in | Wt 140.0 lb

## 2021-06-06 DIAGNOSIS — R2243 Localized swelling, mass and lump, lower limb, bilateral: Secondary | ICD-10-CM | POA: Diagnosis not present

## 2021-06-06 NOTE — Progress Notes (Signed)
   PCP: Girtha Rm, NP-C  Subjective:   HPI: Patient is a 45 y.o. female here for evaluation of bilateral thigh anomaly.  She reports that about 1 to 2 months ago, while she was putting some cream on her skin on her legs, she noticed a dent in her anterior thighs bilaterally.  She reports that she was not aware of these being present previously.  She did not have any trauma or injury, no falls, is not painful, she does not have any numbness or tingling, no back pain, no other associated findings.  She is not recently active but does do a lot of walking at her job.  She is very concerned about the appearance of these and wants to know what they are, and is open to any surgical intervention that could resolve them.   Review of Systems:  Per HPI.   [place], medications and smoking status reviewed.      Objective:  Physical Exam:  No flowsheet data found.   Gen: awake, alert, NAD, comfortable in exam room Pulm: breathing unlabored  Bilateral anterior thigh: -Inspection: No ecchymoses, erythema or edema.  There is a slight step-off at the distal aspect of the quadriceps musculature just proximal to the insertion onto the patellar tendon which is equal bilaterally.  Otherwise no abnormalities. -Palpation: No tenderness to palpation over the area of concern.  Generally nontender in either lower extremity. -ROM: Full range of motion of the hip and knee without pain bilaterally. -Strength: 5/5 strength with knee extension, flexion, hip abduction, external rotation, internal rotation and extension. -Neuro: Normal sensation throughout bilateral lower extremities.  Normal patellar, Achilles, and hamstrings reflexes bilaterally.  Limited US examination of bilateral anterior thighs: -Both right and left anterior thigh visualized with ultrasound.  The area of concern was focused on in this area it appears that the vastus medialis is transitioning into the quadriceps tendon.  There does appear to  be some thickening of the quadriceps tendon and abrupt drop off of the muscle at the musculotendinous junction.  No significant edema, tears, or masses.   Assessment & Plan:  1.  Bilateral thigh abnormality I suspect that the drop off that patient is noting is the area where the vastus medialis transitions into the quadriceps tendon.  No signs of significant tear.  We discussed that this is not anything of concern, and she does not have to do anything about this given that it is not causing any pain.  She is concerned about the cosmetic appearance, and would like to do something to try to make it less visible.  To this end, she was given a quadricep strengthening program with the thought that hypertrophy of the vastus medialis may decrease the size of the drop-off.  We will follow-up in 3 to 4 weeks to reevaluate.   Dagoberto Ligas, MD [facility] Fellow 06/06/2021 11:14 AM

## 2021-06-06 NOTE — Progress Notes (Signed)
[facility]: Attending Note: I have reviewed the chart, discussed wit the Sports Medicine Fellow. I agree with assessment and treatment plan as detailed in the [facility] note. Mild asymmetry of the quadriceps muscles.  We will give her rehabilitation program and follow-up.

## 2021-06-27 ENCOUNTER — Ambulatory Visit: Payer: BC Managed Care – PPO | Admitting: Podiatry

## 2021-07-18 ENCOUNTER — Ambulatory Visit: Payer: BC Managed Care – PPO | Admitting: Podiatry

## 2021-07-18 ENCOUNTER — Ambulatory Visit: Payer: BC Managed Care – PPO | Admitting: Family Medicine

## 2022-07-12 ENCOUNTER — Encounter (HOSPITAL_COMMUNITY): Payer: Self-pay

## 2022-07-12 ENCOUNTER — Emergency Department (HOSPITAL_COMMUNITY): Payer: BC Managed Care – PPO

## 2022-07-12 ENCOUNTER — Emergency Department (HOSPITAL_COMMUNITY)
Admission: EM | Admit: 2022-07-12 | Discharge: 2022-07-12 | Discharge status: Against Medical Advice | Payer: BC Managed Care – PPO | Attending: Emergency Medicine | Admitting: Emergency Medicine

## 2022-07-12 DIAGNOSIS — R079 Chest pain, unspecified: Secondary | ICD-10-CM | POA: Insufficient documentation

## 2022-07-12 DIAGNOSIS — Z5321 Procedure and treatment not carried out due to patient leaving prior to being seen by health care provider: Secondary | ICD-10-CM | POA: Diagnosis not present

## 2022-07-12 LAB — CBC WITH DIFFERENTIAL/PLATELET
Abs Immature Granulocytes: 0.02 10*3/uL (ref 0.00–0.07)
Basophils Absolute: 0 10*3/uL (ref 0.0–0.1)
Basophils Relative: 0 %
Eosinophils Absolute: 0.1 10*3/uL (ref 0.0–0.5)
Eosinophils Relative: 1 %
HCT: 34.6 % — ABNORMAL LOW (ref 36.0–46.0)
Hemoglobin: 11 g/dL — ABNORMAL LOW (ref 12.0–15.0)
Immature Granulocytes: 0 %
Lymphocytes Relative: 15 %
Lymphs Abs: 1.4 10*3/uL (ref 0.7–4.0)
MCH: 28.9 pg (ref 26.0–34.0)
MCHC: 31.8 g/dL (ref 30.0–36.0)
MCV: 90.8 fL (ref 80.0–100.0)
Monocytes Absolute: 0.5 10*3/uL (ref 0.1–1.0)
Monocytes Relative: 5 %
Neutro Abs: 7.5 10*3/uL (ref 1.7–7.7)
Neutrophils Relative %: 79 %
Platelets: 333 10*3/uL (ref 150–400)
RBC: 3.81 MIL/uL — ABNORMAL LOW (ref 3.87–5.11)
RDW: 13.7 % (ref 11.5–15.5)
WBC: 9.5 10*3/uL (ref 4.0–10.5)
nRBC: 0 % (ref 0.0–0.2)

## 2022-07-12 LAB — BASIC METABOLIC PANEL
Anion gap: 12 (ref 5–15)
BUN: 8 mg/dL (ref 6–20)
CO2: 21 mmol/L — ABNORMAL LOW (ref 22–32)
Calcium: 8.8 mg/dL — ABNORMAL LOW (ref 8.9–10.3)
Chloride: 103 mmol/L (ref 98–111)
Creatinine, Ser: 0.66 mg/dL (ref 0.44–1.00)
GFR, Estimated: >60 mL/min (ref >60)
Glucose, Bld: 109 mg/dL — ABNORMAL HIGH (ref 70–99)
Potassium: 3.5 mmol/L (ref 3.5–5.1)
Sodium: 136 mmol/L (ref 135–145)

## 2022-07-12 LAB — I-STAT BETA HCG BLOOD, ED (MC, WL, AP ONLY): I-stat hCG, quantitative: <5 m[IU]/mL (ref <5)

## 2022-07-12 LAB — TROPONIN I (HIGH SENSITIVITY): Troponin I (High Sensitivity): 4 ng/L (ref <18)

## 2022-07-12 NOTE — ED Provider Triage Note (Signed)
Emergency Medicine Provider Triage Evaluation Note  Maureen Simpson , a 46 y.o. female  was evaluated in triage.  Pt complains of central, pressure-like chest pain which began a few months ago. Continues to recur and wax/wane without modifying factors. Took tylenol today for symptoms with mild relief. Pain not specifically exertional. Patient does use birth control. No recent surgeries, hospitalizations, travel, leg swelling, syncope, hemoptysis, fever.  Review of Systems  Positive: As above Negative: As above  Physical Exam  BP (!) 145/98 (BP Location: Right Arm)   Pulse 74   Temp 98 F (36.7 C)   Resp 15   SpO2 100%  Gen:   Awake, no distress   Resp:  Normal effort  MSK:   Moves extremities without difficulty  Other:  Mildly anxious appearing  Medical Decision Making  Medically screening exam initiated at 3:55 AM.  Appropriate orders placed.  Maureen Simpson was informed that the remainder of the evaluation will be completed by another provider, this initial triage assessment does not replace that evaluation, and the importance of remaining in the ED until their evaluation is complete.  Chest pain, chronic - pending labs, EKG, CXR   Antonietta Breach, PA-C 07/12/22 [id number]

## 2022-07-12 NOTE — ED Notes (Signed)
PT opted to leave

## 2022-07-12 NOTE — ED Triage Notes (Signed)
Pt c/o of CP, pressure for the past few months, no SOB or n/v

## 2022-07-15 ENCOUNTER — Telehealth (HOSPITAL_COMMUNITY): Payer: Self-pay

## 2022-07-17 ENCOUNTER — Ambulatory Visit (INDEPENDENT_AMBULATORY_CARE_PROVIDER_SITE_OTHER): Payer: BC Managed Care – PPO | Admitting: Medical

## 2022-07-17 VITALS — BP 132/88 | HR 63 | Temp 97.6°F | Wt 139.8 lb

## 2022-07-17 DIAGNOSIS — I10 Essential (primary) hypertension: Secondary | ICD-10-CM

## 2022-07-17 DIAGNOSIS — F419 Anxiety disorder, unspecified: Secondary | ICD-10-CM | POA: Diagnosis not present

## 2022-07-17 DIAGNOSIS — Z720 Tobacco use: Secondary | ICD-10-CM | POA: Diagnosis not present

## 2022-07-17 DIAGNOSIS — Z6379 Other stressful life events affecting family and household: Secondary | ICD-10-CM | POA: Insufficient documentation

## 2022-07-17 DIAGNOSIS — N920 Excessive and frequent menstruation with regular cycle: Secondary | ICD-10-CM

## 2022-07-17 DIAGNOSIS — D649 Anemia, unspecified: Secondary | ICD-10-CM

## 2022-07-17 DIAGNOSIS — R7309 Other abnormal glucose: Secondary | ICD-10-CM

## 2022-07-17 DIAGNOSIS — R0789 Other chest pain: Secondary | ICD-10-CM

## 2022-07-17 MED ORDER — METOPROLOL SUCCINATE ER 25 MG PO TB24: 25.0000 mg | ORAL_TABLET | Freq: Every day | ORAL | 1 refills | Status: DC

## 2022-07-17 NOTE — Progress Notes (Signed)
Subjective:  Maureen Simpson is a 46 y.o. female who presents for Chief Complaint  Patient presents with   ER follow-up    ER follow-up. Some slight Chest pain but has cleared up, Having some depression issues/ possible anxiety attacks PH-9 and GAD abnormal     Here for ED follow up.   Went to emergency dept 07/12/22 recently for chest pain.  Did see provider and discuss labs, but left before recommendations/treatment.    In last few days having some chest discomfort, intermittent.  Yesterday at work felt a little lightheaded, but has felt lightheaded intermittently the last few months.  Having a lot of stress at home which she feels could be attributed to her symptoms.  Has had a lot of stressors back to back.    Her kids father is incarcerated as of recent, and "not coming back".    she is a single parent currently, and there is a big void with him being gone.   Daughter had recent scary allergic reaction.  Dealing with a lot lately given recent stressors.   She works nights currently, but just recently found out she could switch to first shift which will be helpful.   Has always been an "over thinker."   Not currently seeing counseling, never prior counseling . She was with her baby's father for 17 years until in past year or so.  For past 4 months would eat corn starch straight out of the box.  Has done this prior as well as a child.    The day she went to ED, felt nauseated all day prior to ED visit.    Was on OCP but stopped OCPs the day she went to ED due to fear of heart related side effects.  [facility]  Chart shows history of hypertension but hasn't been on medication in several years.  Doesn't check BP at home.    Asthma - no recent problems.  Last inhaler use years ago.    Smokes, but no drugs.   Drinks some wine occasionally  No other aggravating or relieving factors.    No other c/o.  Past Medical History:  Diagnosis Date   Abnormal  Pap smear    during preg   Asthma    Chronic hypertension 06/17/2016   Lipoma    right forearm   No pertinent past medical history    UTI (lower urinary tract infection)    Vaginal Pap smear, abnormal    No current outpatient medications on file prior to visit.   No current facility-administered medications on file prior to visit.    The following portions of the patient's history were reviewed and updated as appropriate: allergies, current medications, past family history, past medical history, past social history, past surgical history and problem list.  ROS Otherwise as in subjective above    Objective: BP 132/88   Pulse 63   Temp 97.6 F (36.4 C)   Wt 139 lb 12.8 oz (63.4 kg)   SpO2 98%   BMI 24.76 kg/m   BP Readings from Last 3 Encounters:  07/17/22 132/88  07/12/22 (!) 145/98  06/06/21 (!) 134/92    General appearance: alert, no distress, well developed, well nourished, anxious African American female HEENT: normocephalic, sclerae anicteric, conjunctiva pink and moist, TMs pearly, nares patent, no discharge or erythema, pharynx normal Oral cavity: MMM, no lesions Neck: supple, no lymphadenopathy, no thyromegaly, no masses Heart: RRR, normal S1, S2, no  murmurs Lungs: CTA bilaterally, no wheezes, rhonchi, or rales Abdomen: +bs, soft, non tender, non distended, no masses, no hepatomegaly, no splenomegaly Pulses: 2+ radial pulses, 2+ pedal pulses, normal cap refill Ext: no edema Psych: anxious appearing, but does answers questions appropriately   Assessment: Encounter Diagnoses  Name Primary?   Anxiety Yes   Stressful life event affecting family    Hypocalcemia    Tobacco use    Menorrhagia with regular cycle    Chest discomfort    Elevated glucose    Essential hypertension, benign    Anemia, unspecified type      Plan: I reviewed her recent emergency department notes, labs, EKG.  She has a whole lot going on in terms of stress.  We discussed the  medical findings from recent labs, we discussed her overall health, her concerns about stress and anxiety.  Thus we are addressing several issues today. I gave a list of counselors.   Recommendations: I strongly recommend you seek counseling ASAP. Check with your employer first to see if they have employee assistance program which could be free counseling If not, I have listed some counselors below.  I recommend reaching out and calling and trying to get appointment right away  Your recent lab work shows elevated glucose, chronically low calcium and anemia.  I am checking additional labs in this regard today.  We will call back with lab results and recommendations.  I am referring you to gynecology to discuss other safer birth control options particularly since you smoke and have heavy periods.  Begin Toprol-XL 25 mg daily.  This is a pill to help with blood pressure but could also possibly help with anxiety and anxiousness.   Ways to deal with stress and anxiety. I recommend regular exercise such as 30 minutes or more most days of the week such as walking running and bicycling I recommend taking some time to meditate or pray daily to help slow racing thoughts. I recommend working on relaxation techniques such as deep breathing exercises in a comfortable position relaxing your body.  There are free Apps on the smart phone for this for example Consider getting a massage Journal or use diary to express your ideas on paper to cope with anxiety and stress Work on time management, use a calendar or plan out things to avoid stressing about things. Find ways to utilize your time to include exercise and personal "me" time. Some people use aromatherapy such as lavender to relax Some people use herbal teas to help calm their mood Spend some time with animals or your pet if you have one Consider seeing a counselor to help deal with anxiety and work on specific techniques     Annibelle was seen  today for er follow-up.  Diagnoses and all orders for this visit:  Anxiety  Stressful life event affecting family  Hypocalcemia -     PTH, Intact and Calcium -     TSH -     VITAMIN D 25 Hydroxy (Vit-D Deficiency, Fractures) -     Basic metabolic panel  Tobacco use  Menorrhagia with regular cycle  Chest discomfort  Elevated glucose -     Hemoglobin A1c -     Basic metabolic panel  Essential hypertension, benign  Anemia, unspecified type -     Iron, TIBC and Ferritin Panel    Follow up: pending labs

## 2022-07-17 NOTE — Patient Instructions (Addendum)
Encounter Diagnoses  Name Primary?   Anxiety Yes   Stressful life event affecting family    Hypocalcemia    Tobacco use    Menorrhagia with regular cycle    Chest discomfort    Elevated glucose    Essential hypertension, benign    Anemia, unspecified type    Recommendations: I strongly recommend you seek counseling ASAP. Check with your employer first to see if they have employee assistance program which could be free counseling If not, I have listed some counselors below.  I recommend reaching out and calling and trying to get appointment right away  Your recent lab work shows elevated glucose, chronically low calcium and anemia.  I am checking additional labs in this regard today.  We will call back with lab results and recommendations.  I am referring you to gynecology to discuss other safer birth control options particularly since you smoke and have heavy periods.  Begin Toprol-XL 25 mg daily.  This is a pill to help with blood pressure but could also possibly help with anxiety and anxiousness.   Ways to deal with stress and anxiety. I recommend regular exercise such as 30 minutes or more most days of the week such as walking running and bicycling I recommend taking some time to meditate or pray daily to help slow racing thoughts. I recommend working on relaxation techniques such as deep breathing exercises in a comfortable position relaxing your body.  There are free Apps on the smart phone for this for example Consider getting a massage Journal or use diary to express your ideas on paper to cope with anxiety and stress Work on time management, use a calendar or plan out things to avoid stressing about things. Find ways to utilize your time to include exercise and personal "me" time. Some people use aromatherapy such as lavender to relax Some people use herbal teas to help calm their mood Spend some time with animals or your pet if you have one Consider seeing a counselor to  help deal with anxiety and work on specific techniques     RESOURCES in [place], [place]  If you are experiencing a mental health crisis or an emergency, please call 911 or go to the nearest emergency department.  [facility]   [phone] [facility]  [phone] [facility]   [phone]  Suicide Hotline 1-800-Suicide 361-541-3100)  National Suicide Prevention Lifeline (705)152-3636  9703351164)  Domestic Violence, Rape/Crisis - [facility] [place] 872 502 4620  The [employer] Violence Hotline 1-800-799-SAFE 325-617-5915)  To report Child or Elder Abuse, please call: [place] [facility] Police Department  [phone] [facility] Department  [phone]  Teen Crisis line [phone] or [phone]    [place] and Main phone number [facility]  [facility] [place] [phone]   [facility] [phone] [address] [place], [place], [place] [address]   [facility] of the [place] office [address] [place], [place] [address] Crisis services, Family support, in home therapy, treatment for Anxiety, PTSD, Sexual Assault, Substance Abuse, Financial/Credit Counseling, Variety of other services    Dr. George Hugh, [place] [phone] [place], [place] [address]   [facility] [phone] [address] [place], [place] [address]

## 2022-07-18 LAB — PTH, INTACT AND CALCIUM: PTH: 59 pg/mL (ref 15–65)

## 2022-07-18 LAB — HEMOGLOBIN A1C
Est. average glucose Bld gHb Est-mCnc: 108 mg/dL
Hgb A1c MFr Bld: 5.4 % (ref 4.8–5.6)

## 2022-07-18 LAB — BASIC METABOLIC PANEL
BUN/Creatinine Ratio: 18 (ref 9–23)
BUN: 12 mg/dL (ref 6–24)
CO2: 22 mmol/L (ref 20–29)
Calcium: 9.1 mg/dL (ref 8.7–10.2)
Chloride: 104 mmol/L (ref 96–106)
Creatinine, Ser: 0.65 mg/dL (ref 0.57–1.00)
Glucose: 80 mg/dL (ref 70–99)
Potassium: 4.5 mmol/L (ref 3.5–5.2)
Sodium: 139 mmol/L (ref 134–144)
eGFR: 111 mL/min/{1.73_m2} (ref >59)

## 2022-07-18 LAB — IRON,TIBC AND FERRITIN PANEL
Ferritin: 9 ng/mL — ABNORMAL LOW (ref 15–150)
Iron Saturation: 7 % — CL (ref 15–55)
Iron: 29 ug/dL (ref 27–159)
Total Iron Binding Capacity: 446 ug/dL (ref 250–450)
UIBC: 417 ug/dL (ref 131–425)

## 2022-07-18 LAB — VITAMIN D 25 HYDROXY (VIT D DEFICIENCY, FRACTURES): Vit D, 25-Hydroxy: 13.4 ng/mL — ABNORMAL LOW (ref 30.0–100.0)

## 2022-07-18 LAB — TSH: TSH: 3.49 u[IU]/mL (ref 0.450–4.500)

## 2022-07-20 ENCOUNTER — Other Ambulatory Visit: Payer: Self-pay | Admitting: Medical

## 2022-07-20 MED ORDER — VITAMIN D 50 MCG (2000 UT) PO CAPS: 1.0000 | ORAL_CAPSULE | Freq: Every day | ORAL | 3 refills | Status: DC

## 2022-07-20 MED ORDER — POLYSACCHARIDE IRON COMPLEX 150 MG PO CAPS: 150.0000 mg | ORAL_CAPSULE | Freq: Two times a day (BID) | ORAL | 1 refills | Status: DC

## 2022-07-24 ENCOUNTER — Ambulatory Visit (HOSPITAL_COMMUNITY)
Admission: EM | Admit: 2022-07-24 | Discharge: 2022-07-24 | Disposition: A | Discharge status: Home / Self Care | Payer: BC Managed Care – PPO | Attending: Nurse Practitioner | Admitting: Nurse Practitioner

## 2022-07-24 DIAGNOSIS — F4322 Adjustment disorder with anxiety: Secondary | ICD-10-CM

## 2022-07-24 MED ORDER — HYDROXYZINE HCL 10 MG PO TABS: 10.0000 mg | ORAL_TABLET | Freq: Three times a day (TID) | ORAL | 0 refills | Status: DC | PRN

## 2022-07-24 NOTE — Discharge Instructions (Addendum)

## 2022-07-24 NOTE — Progress Notes (Signed)
   07/24/22 0215  Patient Reported Information  How Did You Hear About [place]? Self  What Is the Reason for Your Visit/Call Today? Pt reports, in April 2023 while on [employer] looking up the weather so she can dress her kids accordingly; she seen her ex's name for charges of [place]. Pt reports, she called her ex's mother to ask about the charges and became very anxious, was having trouble breathing, shaking. Pt reports, she's been eating corn starch, her sister told her to stop; she became nauseous and was having chest discomfort. Pt reports, she went to the hospital (on 07/12/2022) but left after blood work. Per pt, she was recommended to go to her PCP (on 07/17/2022)  for her test results. Pt reports, she had low iron and calcium, her blood pressure is up and down. Pt reports, she's prescribed vitamin D, blood pressure medication and anxiety medications. Pt reports, she felt better because she thought she had something more severe however her anxiety returned when she had to watch a video and take a quiz before she could get her anxiety medication. Pt reports, she took one anxiety pill at 10PM.Pt denies, SI, HI, AVH, self-injurious behaviors and access to weapons. Pt reports, if discharged she can contract for safety.  How Long Has This Been Causing You Problems? 1 wk - 1 month  What Do You Feel Would Help You the Most Today? Treatment for Depression or other mood problem;Medication(s)  Have You Recently Had Any Thoughts About Hurting Yourself? No  Are You Planning to Commit Suicide/Harm Yourself At This time? No  Have you Recently Had Thoughts About [place]? No  Are You Planning To Harm Someone At This Time? No  Have You Used Any Alcohol or Drugs in the Past 24 Hours? No  Do You Currently Have a Therapist/Psychiatrist? No (Pt reports, her PCP recommends she is linked to a therapist.)  [facility] Screening Triage Referral Assessment  Type of Contact Face-to-Face  Location of Assessment [facility]  Assessment Services  Provider location [facility] Assessment Services  Patient Determined To Be At Risk for Harm To Self or Others Based on Review of Patient Reported Information or Presenting Complaint? No  Does Patient Present under Involuntary Commitment? No  [place] of Residence [place]  Patient Currently Receiving the Following Services: Not Receiving Services  Determination of Need Routine (7 days)  Options For Referral Medication Management;Outpatient Therapy     Determination of need: Routine.   Vertell Novak, [place], [facility], [facility] Triage Specialist [phone]

## 2022-07-24 NOTE — ED Provider Notes (Signed)
Behavioral Health Urgent Care Medical Screening Exam  Patient Name: Maureen Simpson MRN: [id number] Date of Evaluation: 07/24/22 Chief Complaint:  "Anxiety, can't sleep".  Diagnosis:  Final diagnoses:  Adjustment disorder with anxious mood    History of Present illness: Maureen Simpson is a 46 y.o. female. with history of anxiety and stressful life event affecting family, who presented voluntarily as a walk-in to [facility] with complaints anxiety that first started since April this year.  Pt reports that she has been through a lot this year (loss/break up of significant relationship of 17 years), and loss of a friend and 1 family member (cousin) to death.   Pt reports her children's father, whom she knew since they were kids and been together for 4 years, got into drugs, armed robbery and was sent to [place], thereby disrupting their lives. Pt reports he found out he was involved in bad stuff on the Internet this past April and that was a shock for her, causing her a panic attack at the time. Pt reports " I just hate he does armed robbery and was charged with four counts, I heard he was on different drugs too". " Its worse when I think about it because his mother enabled him and bailed him out of jail every time". " I feel bad for my son because he was really close to his father".   Pt endorses symptoms of generalized anxiety, including excessive worrying throughout the day, associated with, restlessness, easy fatigability, difficulty concentration, irritability, muscle tension and disrupted sleep.   Pt denies depressive symptoms, including low mood, sleep alteration, loss of interest in pleasurable activities, feelings of guilt/worthlessness/hopelessness, problems with energy, problems with concentration, appetite disturbance.    Pt reports she went to the ED earlier this month with symptoms of chest pain and muscle tension due to anxiety. Per chart review, the patient was seen at [facility] 07[facility]12/22  for complaints of pressure-like chest pain that began a few months ago, that waxes and wanes. Pt opted to leave the ED, and its unclear if she left AMA.   On 07/17/22, pt was seen by her PCP for anxiety and recommended to seek therapy/counseling. Pt reports she is in the process of setting up an appointment with a therapist through her cousin.   Pt reports she came to [facility]  because she watched a video about her blood pressure medication (metoprolol succinate), which she has been taking for "all this while" without any side effects. Pt reports she got scared, freaked out and texted her cousin about this scary med she has been taking. Pt reports her cousin encouraged her to come seek help as she is unable to sleep and very worried. Pt encouraged to follow up with PCP for all medical issues, concerns, or health needs. Pt denies SI/HI/AVH. Pt reports she lives with her son and daughter. Pt reports she is employed and has a lot of family support.  Pt reports she feels safe returning home. Pt denies presence of weapon at home or access to one.   Pt denies use or dependence on alcohol, illicit drugs, or smoking marijuana. Pt reports she smokes a cigarette or two sometimes but is not dependent on it.   On evaluation, patient is alert, oriented x 4, and cooperative. Speech is clear, coherent and logical. Pt appears casual. Eye contact is good. Mood is anxious, affect is congruent with mood. Thought process is logical and thought content is coherent. Pt denies SI/HI/AVH. There is no indication  that the patient is responding to internal stimuli. No delusions elicited during this assessment.    Support, encouragement and reassurance provided about ongoing stressors. Pt provided with opportunity for questions.   Psychiatric Specialty Exam   Presentation  General Appearance:Casual  Eye Contact:Good  Speech:Clear and Coherent  Speech Volume:Normal  Handedness:Right   Mood and Affect   Mood:Anxious  Affect:Congruent   Thought Process  Thought Processes:Coherent  Descriptions of Associations:Intact  Orientation:Full (Time, Place and Person)  Thought Content:Logical    Hallucinations:None  Ideas of Reference:None  Suicidal Thoughts:No  Homicidal Thoughts:No   Sensorium  Memory:Immediate Good; Recent Good  Judgment:Good  Insight:Good   Executive Functions  Concentration:Good  Attention Span:Good  Recall:Good  Fund of Knowledge:Good  Language:Good   Psychomotor Activity  Psychomotor Activity:Normal   Assets  Assets:Communication Skills; Desire for Improvement; Financial Resources/Insurance; Housing; Social Support; Resilience   Sleep  Sleep:Fair  Number of hours: No data recorded  Nutritional Assessment (For OBS and FBC admissions only) Has the patient had a weight loss or gain of 10 pounds or more in the last 3 months?: No Has the patient had a decrease in food intake/or appetite?: No Does the patient have dental problems?: No Does the patient have eating habits or behaviors that may be indicators of an eating disorder including binging or inducing vomiting?: No Has the patient recently lost weight without trying?: 0 Has the patient been eating poorly because of a decreased appetite?: 0 Malnutrition Screening Tool Score: 0    Physical Exam: Physical Exam Constitutional:      General: She is not in acute distress.    Appearance: She is normal weight. She is not diaphoretic.  HENT:     Head: Normocephalic.     Right Ear: External ear normal.     Left Ear: External ear normal.     Nose: No congestion.  Eyes:     General:        Right eye: No discharge.        Left eye: No discharge.  Cardiovascular:     Rate and Rhythm: Normal rate.  Pulmonary:     Effort: No respiratory distress.  Chest:     Chest wall: No tenderness.  Neurological:     Mental Status: She is alert and oriented to person, place, and time.   Psychiatric:        Attention and Perception: Attention and perception normal.        Mood and Affect: Affect normal. Mood is anxious.        Speech: Speech normal.        Behavior: Behavior is cooperative.        Thought Content: Thought content normal. Thought content is not paranoid or delusional. Thought content does not include homicidal or suicidal ideation. Thought content does not include homicidal or suicidal plan.        Cognition and Memory: Cognition and memory normal.        Judgment: Judgment normal.    Review of Systems  Constitutional:  Negative for chills, diaphoresis and fever.  HENT:  Negative for congestion.   Eyes:  Negative for discharge.  Respiratory:  Negative for cough, shortness of breath and wheezing.   Cardiovascular:  Negative for chest pain and palpitations.  Gastrointestinal:  Negative for constipation, nausea and vomiting.  Neurological:  Negative for dizziness, seizures, loss of consciousness, weakness and headaches.  Psychiatric/Behavioral:  Negative for depression, hallucinations, memory loss, substance abuse and suicidal ideas. The patient  is nervous/anxious. The patient does not have insomnia.    Pulse 77, temperature 98.3 F (36.8 C), temperature source Oral, resp. rate 17, SpO2 99 %. There is no height or weight on file to calculate BMI.  Musculoskeletal: Strength & Muscle Tone: within normal limits Gait & Station: normal Patient leans: N/A   [place] MSE Discharge Disposition for Follow up and Recommendations: Based on my evaluation the patient does not appear to have an emergency medical condition and can be discharged with resources and follow up care in outpatient services for Medication Management and Individual Therapy  Recommend discharge and follow up with outpatient mental health provider for medication management and therapy. Pt provided with outpatient mental health resources.   Recommend continue taking all other medications as  prescribed.  Medication initiated this encounter -Start Hydroxyzine (Atarax) 10 mg PO TID prn for anxiety.  Prescription order sent to pharmacy, and follow up medication management with outpatient provider.    Pt does not meet criteria for inpatient psychiatric treatment or IVC. There is no evidence of imminent risk of harm to self or others at this time.    Please refrain from using alcohol or illicit substances, as they can affect your mood and can cause depression, anxiety or other concerning symptoms.  Alcohol can increase the chance that a person will make reckless decisions, like attempting suicide, and can increase the lethality of a drug overdose.    Recommend discharge to home/self care Pt agrees with discharge plan. Condition at discharge is stable.   Randon Goldsmith, NP 07/24/2022, 3:27 AM

## 2022-07-24 NOTE — ED Notes (Signed)
AVS and additional resources were reviewed with Maureen Simpson who verbalized understanding.

## 2022-08-14 ENCOUNTER — Ambulatory Visit: Payer: BC Managed Care – PPO | Admitting: Medical

## 2022-08-14 ENCOUNTER — Other Ambulatory Visit: Payer: Self-pay

## 2022-08-14 DIAGNOSIS — Z720 Tobacco use: Secondary | ICD-10-CM

## 2022-08-14 DIAGNOSIS — N920 Excessive and frequent menstruation with regular cycle: Secondary | ICD-10-CM

## 2022-08-18 ENCOUNTER — Telehealth: Payer: Self-pay | Admitting: Family Medicine

## 2022-08-18 NOTE — Telephone Encounter (Signed)
Gynecology Referral

## 2022-08-22 ENCOUNTER — Emergency Department (HOSPITAL_COMMUNITY)
Admission: EM | Admit: 2022-08-22 | Discharge: 2022-08-23 | Discharge status: Against Medical Advice | Payer: BC Managed Care – PPO | Attending: Emergency Medicine | Admitting: Emergency Medicine

## 2022-08-22 ENCOUNTER — Other Ambulatory Visit: Payer: Self-pay

## 2022-08-22 DIAGNOSIS — J029 Acute pharyngitis, unspecified: Secondary | ICD-10-CM | POA: Diagnosis present

## 2022-08-22 DIAGNOSIS — Z5321 Procedure and treatment not carried out due to patient leaving prior to being seen by health care provider: Secondary | ICD-10-CM | POA: Insufficient documentation

## 2022-08-22 DIAGNOSIS — U071 COVID-19: Secondary | ICD-10-CM | POA: Insufficient documentation

## 2022-08-22 LAB — GROUP A STREP BY PCR: Group A Strep by PCR: NOT DETECTED

## 2022-08-22 LAB — SARS CORONAVIRUS 2 BY RT PCR: SARS Coronavirus 2 by RT PCR: POSITIVE — AB

## 2022-08-22 NOTE — ED Triage Notes (Signed)
Patient woke up this morning with a sore throat, body aches and chills, cough developing since today. Denies any sick contacts.

## 2022-08-22 NOTE — ED Provider Triage Note (Signed)
Emergency Medicine Provider Triage Evaluation Note  MIRENDA BALTAZAR , a 46 y.o. female  was evaluated in triage.  Pt complains of sore throat, cough, chills, body aches since today. Having nonproductive cough. Not short of breath. Denies sick contacts   Review of Systems  Positive: See above Negative:   Physical Exam  BP (!) 160/102 (BP Location: Left Arm)   Pulse 68   Temp 99.4 F (37.4 C) (Oral)   Resp 20   SpO2 100%  Gen:   Awake, no distress   Resp:  Normal effort  MSK:   Moves extremities without difficulty  Other:  Congested   Medical Decision Making  Medically screening exam initiated at 6:39 PM.  Appropriate orders placed.  ANISA LEANOS was informed that the remainder of the evaluation will be completed by another provider, this initial triage assessment does not replace that evaluation, and the importance of remaining in the ED until their evaluation is complete.     Mickie Hillier, PA-C 08/22/22 1840

## 2022-08-23 NOTE — ED Notes (Signed)
Called pt for vitals with no answer and pt not seen in lobby or outside waiting area

## 2022-09-17 ENCOUNTER — Other Ambulatory Visit: Payer: Self-pay | Admitting: Medical

## 2022-11-11 ENCOUNTER — Encounter: Payer: Self-pay | Admitting: Internal Medicine

## 2022-11-24 ENCOUNTER — Encounter: Payer: Self-pay | Admitting: Family Medicine

## 2022-11-24 ENCOUNTER — Ambulatory Visit (INDEPENDENT_AMBULATORY_CARE_PROVIDER_SITE_OTHER): Payer: BC Managed Care – PPO | Admitting: Family Medicine

## 2022-11-24 ENCOUNTER — Encounter: Payer: Self-pay | Admitting: Nurse Practitioner

## 2022-11-24 VITALS — BP 138/88 | HR 60 | Temp 98.4°F | Ht 63.5 in | Wt 143.4 lb

## 2022-11-24 DIAGNOSIS — L509 Urticaria, unspecified: Secondary | ICD-10-CM | POA: Diagnosis not present

## 2022-11-24 DIAGNOSIS — R590 Localized enlarged lymph nodes: Secondary | ICD-10-CM

## 2022-11-24 NOTE — Progress Notes (Signed)
   Subjective:    Patient ID: Maureen Simpson, female    DOB: 11-18-1976, 46 y.o.   MRN: [id number]  HPI She complains of a 1 month history of intermittent itching and rash.  No new soaps, detergents or other chemicals.  She also notes that her ears seem to be swollen.  She then pointed out that she felt a lump in the right posterolateral neck area.   Review of Systems     Objective:   Physical Exam She did take a picture of the rash as it does tend to come and go and does  appear to be in a wheal type reaction. Alert and in no distress. Tympanic membranes and canals are normal. Pharyngeal area is normal. Neck is supple with a 1-1/2 cm round relatively soft appearing lesion in the right posterolateral neck area.  No axillary or inguinal adenopathy.. Cardiac exam shows a regular sinus rhythm without murmurs or gallops. Lungs are clear to auscultation.  Abdominal exam shows no hepatosplenomegaly.       Assessment & Plan:  Urticaria - Plan: CBC with Differential/Platelet, Comprehensive metabolic panel  Posterior cervical adenopathy - Plan: CBC with Differential/Platelet, Comprehensive metabolic panel This sort of situation could be an occult malignancy.  You will need to follow this closely.

## 2022-11-25 LAB — COMPREHENSIVE METABOLIC PANEL
ALT: 14 IU/L (ref 0–32)
AST: 24 IU/L (ref 0–40)
Albumin/Globulin Ratio: 1.7 (ref 1.2–2.2)
Albumin: 4.5 g/dL (ref 3.9–4.9)
Alkaline Phosphatase: 85 IU/L (ref 44–121)
BUN/Creatinine Ratio: 25 — ABNORMAL HIGH (ref 9–23)
BUN: 17 mg/dL (ref 6–24)
Bilirubin Total: 0.3 mg/dL (ref 0.0–1.2)
CO2: 23 mmol/L (ref 20–29)
Calcium: 9 mg/dL (ref 8.7–10.2)
Chloride: 103 mmol/L (ref 96–106)
Creatinine, Ser: 0.69 mg/dL (ref 0.57–1.00)
Globulin, Total: 2.6 g/dL (ref 1.5–4.5)
Glucose: 80 mg/dL (ref 70–99)
Potassium: 4.2 mmol/L (ref 3.5–5.2)
Sodium: 138 mmol/L (ref 134–144)
Total Protein: 7.1 g/dL (ref 6.0–8.5)
eGFR: 108 mL/min/{1.73_m2} (ref >59)

## 2022-11-25 LAB — CBC WITH DIFFERENTIAL/PLATELET
Basophils Absolute: 0 10*3/uL (ref 0.0–0.2)
Basos: 1 %
EOS (ABSOLUTE): 0.3 10*3/uL (ref 0.0–0.4)
Eos: 5 %
Hematocrit: 38.2 % (ref 34.0–46.6)
Hemoglobin: 12.8 g/dL (ref 11.1–15.9)
Immature Grans (Abs): 0 10*3/uL (ref 0.0–0.1)
Immature Granulocytes: 0 %
Lymphocytes Absolute: 2.3 10*3/uL (ref 0.7–3.1)
Lymphs: 37 %
MCH: 30.9 pg (ref 26.6–33.0)
MCHC: 33.5 g/dL (ref 31.5–35.7)
MCV: 92 fL (ref 79–97)
Monocytes Absolute: 0.5 10*3/uL (ref 0.1–0.9)
Monocytes: 8 %
Neutrophils Absolute: 3.1 10*3/uL (ref 1.4–7.0)
Neutrophils: 49 %
Platelets: 259 10*3/uL (ref 150–450)
RBC: 4.14 x10E6/uL (ref 3.77–5.28)
RDW: 12.8 % (ref 11.7–15.4)
WBC: 6.2 10*3/uL (ref 3.4–10.8)

## 2022-11-25 MED ORDER — HYDROXYZINE PAMOATE 25 MG PO CAPS: 25.0000 mg | ORAL_CAPSULE | Freq: Three times a day (TID) | ORAL | 0 refills | Status: DC | PRN

## 2022-11-25 NOTE — Addendum Note (Signed)
Addended by: Denita Lung on: 11/25/2022 04:41 PM   Modules accepted: Orders

## 2022-12-01 ENCOUNTER — Encounter: Payer: Self-pay | Admitting: Family Medicine

## 2022-12-01 ENCOUNTER — Ambulatory Visit (INDEPENDENT_AMBULATORY_CARE_PROVIDER_SITE_OTHER): Payer: BC Managed Care – PPO | Admitting: Family Medicine

## 2022-12-01 VITALS — BP 124/80 | HR 59 | Wt 145.0 lb

## 2022-12-01 DIAGNOSIS — L509 Urticaria, unspecified: Secondary | ICD-10-CM

## 2022-12-01 NOTE — Progress Notes (Signed)
   Subjective:    Patient ID: Maureen Simpson, female    DOB: 02-Jan-1976, 46 y.o.   MRN: [id number]  HPI She is here for a recheck.  She states that the rash is gone and also the lymph node in the right side of her neck is also diminished in size.   Review of Systems     Objective:   Physical Exam Alert and in no distress.  Exam of the neck shows no palpable lesion on the right and only minimally palpable less than 1 cm lesion that is movable and nontender in the left mid neck area.  Skin appears normal.       Assessment & Plan:  Urticaria I explained that since she is no longer having any symptoms, no further intervention needed.  Did explain that I was concerned about some the symptoms being a marker for cancer.  She was relieved to know that it was not the case.

## 2022-12-14 ENCOUNTER — Ambulatory Visit (INDEPENDENT_AMBULATORY_CARE_PROVIDER_SITE_OTHER): Payer: BC Managed Care – PPO | Admitting: Nurse Practitioner

## 2022-12-14 ENCOUNTER — Encounter: Payer: Self-pay | Admitting: Nurse Practitioner

## 2022-12-14 VITALS — BP 124/82 | HR 60 | Temp 98.5°F | Wt 145.0 lb

## 2022-12-14 DIAGNOSIS — L298 Other pruritus: Secondary | ICD-10-CM

## 2022-12-14 DIAGNOSIS — L219 Seborrheic dermatitis, unspecified: Secondary | ICD-10-CM

## 2022-12-14 MED ORDER — KETOCONAZOLE 2 % EX SHAM: 1.0000 | MEDICATED_SHAMPOO | CUTANEOUS | 6 refills | Status: DC

## 2022-12-14 MED ORDER — CLOBETASOL PROPIONATE 0.05 % EX SOLN: 1.0000 | Freq: Two times a day (BID) | CUTANEOUS | 6 refills | Status: DC

## 2022-12-14 NOTE — Patient Instructions (Signed)
Send me a message if this is not helpful, you do not have to come back in to see me. :-)

## 2022-12-16 ENCOUNTER — Encounter: Payer: Self-pay | Admitting: Nurse Practitioner

## 2022-12-16 DIAGNOSIS — L219 Seborrheic dermatitis, unspecified: Secondary | ICD-10-CM

## 2022-12-16 DIAGNOSIS — L298 Other pruritus: Secondary | ICD-10-CM | POA: Insufficient documentation

## 2022-12-16 HISTORY — DX: Seborrheic dermatitis, unspecified: L21.9

## 2022-12-16 NOTE — Assessment & Plan Note (Signed)
Suspect a component of eczema and possible tinea infection present given the symptoms and descriptors. She has had labs recently for the same condition which were non revealing. We will plan to start treatment with ketoconazole shampoo twice weekly and clobetasol solution to the scalp daily for up to 14 days then as needed. May continue both on twice a week regimen if symptoms return to help prevent recurrence.

## 2022-12-16 NOTE — Progress Notes (Signed)
  Orma Render, DNP, AGNP-c [facility] [address] [place], [place] [address] [phone]  Subjective:   Maureen Simpson is a 46 y.o. female presents to day for evaluation of: Pruritus Abrie endorses pruritus to the scalp and forehead. She tells me that she has scratched her scalp to the point that her hair has started to fall out in areas. Initially she noticed a scaly rash present, but she did use her daughters triamcinolone cream and this helped with the rash and the itching. She has concerns using it on her scalp as it did cause discoloration when her daughter used it on her face previously. She has no known exposures to any new products.   PMH, Medications, and Allergies reviewed and updated in chart as appropriate.   ROS negative except for what is listed in HPI. Objective:  BP 124/82   Pulse 60   Temp 98.5 F (36.9 C)   Wt 145 lb (65.8 kg)   LMP 10/21/2022 Comment: heavy and irregular  BMI 25.28 kg/m  Physical Exam Vitals and nursing note reviewed.  Constitutional:      Appearance: Normal appearance.  HENT:     Head: Atraumatic.     Comments: Patchy hair thinning noted to the temple regions bilaterally. No rash visible at this time.  Eyes:     Extraocular Movements: Extraocular movements intact.     Pupils: Pupils are equal, round, and reactive to light.  Cardiovascular:     Rate and Rhythm: Normal rate.     Pulses: Normal pulses.  Pulmonary:     Effort: Pulmonary effort is normal.  Skin:    General: Skin is warm and dry.     Capillary Refill: Capillary refill takes less than 2 seconds.  Neurological:     General: No focal deficit present.     Mental Status: She is alert.  Psychiatric:        Mood and Affect: Mood normal.           Assessment & Plan:   Problem List Items Addressed This Visit     Pruritic dermatosis of scalp - Primary    Suspect a component of eczema and possible tinea infection present given the symptoms and  descriptors. She has had labs recently for the same condition which were non revealing. We will plan to start treatment with ketoconazole shampoo twice weekly and clobetasol solution to the scalp daily for up to 14 days then as needed. May continue both on twice a week regimen if symptoms return to help prevent recurrence.       Relevant Medications   ketoconazole (NIZORAL) 2 % shampoo   clobetasol (TEMOVATE) 0.05 % external solution      Orma Render, DNP, AGNP-c 12/16/2022  12:06 PM    History, Medications, Surgery, SDOH, and Family History reviewed and updated as appropriate.

## 2022-12-30 ENCOUNTER — Telehealth: Payer: Self-pay | Admitting: Nurse Practitioner

## 2022-12-30 NOTE — Telephone Encounter (Signed)
Hey, just a heads up

## 2022-12-31 ENCOUNTER — Telehealth: Payer: Self-pay | Admitting: Family Medicine

## 2022-12-31 NOTE — Telephone Encounter (Signed)
Pt states the shampoo not working.  She needs something stronger.  She is losing a lot of hair and it is getting depressing. Her hair is breaking off and scalp is itching.  She covers her head with Vaseline and it helps temporarily  Please call her with suggestions.  You can leave detail on voice mail.Marland Kitchen

## 2023-01-01 ENCOUNTER — Other Ambulatory Visit: Payer: Self-pay | Admitting: Nurse Practitioner

## 2023-01-01 ENCOUNTER — Telehealth: Payer: Self-pay | Admitting: Nurse Practitioner

## 2023-01-01 DIAGNOSIS — L298 Other pruritus: Secondary | ICD-10-CM

## 2023-01-01 MED ORDER — PREDNISONE 20 MG PO TABS: ORAL_TABLET | ORAL | 0 refills | Status: DC

## 2023-01-01 NOTE — Telephone Encounter (Signed)
Medication has been sent and referral has been made to dermatology.

## 2023-01-01 NOTE — Telephone Encounter (Signed)
Pt called in this morning, she didn't get your message on My Chart but she says it is ok to send the steroid in, she will try it.

## 2023-01-01 NOTE — Telephone Encounter (Signed)
Would like to try steriod, but needs a referral to dermatology as everyone is booked out into April  [facility] [address]

## 2023-01-07 ENCOUNTER — Institutional Professional Consult (permissible substitution): Payer: BC Managed Care – PPO | Admitting: Nurse Practitioner

## 2023-01-28 ENCOUNTER — Other Ambulatory Visit: Payer: Self-pay | Admitting: Medical

## 2023-04-09 ENCOUNTER — Telehealth: Payer: Self-pay | Admitting: Nurse Practitioner

## 2023-04-09 NOTE — Telephone Encounter (Signed)
Dismissal letter in guarantor snapshot  °

## 2023-05-18 ENCOUNTER — Ambulatory Visit (HOSPITAL_COMMUNITY): Payer: BC Managed Care – PPO

## 2023-08-03 ENCOUNTER — Other Ambulatory Visit: Payer: Self-pay | Admitting: Medical

## 2023-11-30 ENCOUNTER — Other Ambulatory Visit: Payer: Self-pay | Admitting: Medical

## 2024-03-26 ENCOUNTER — Ambulatory Visit (HOSPITAL_COMMUNITY)
Admission: EM | Admit: 2024-03-26 | Discharge: 2024-03-26 | Disposition: A | Discharge status: Home / Self Care | Payer: Self-pay | Attending: Family Medicine | Admitting: Family Medicine

## 2024-03-26 ENCOUNTER — Encounter (HOSPITAL_COMMUNITY): Payer: Self-pay | Admitting: Emergency Medicine

## 2024-03-26 DIAGNOSIS — M79662 Pain in left lower leg: Secondary | ICD-10-CM | POA: Insufficient documentation

## 2024-03-26 DIAGNOSIS — R590 Localized enlarged lymph nodes: Secondary | ICD-10-CM | POA: Insufficient documentation

## 2024-03-26 LAB — COMPREHENSIVE METABOLIC PANEL WITH GFR
ALT: 12 U/L (ref 0–44)
AST: 29 U/L (ref 15–41)
Albumin: 3.7 g/dL (ref 3.5–5.0)
Alkaline Phosphatase: 81 U/L (ref 38–126)
Anion gap: 8 (ref 5–15)
BUN: 12 mg/dL (ref 6–20)
CO2: 22 mmol/L (ref 22–32)
Calcium: 8.9 mg/dL (ref 8.9–10.3)
Chloride: 105 mmol/L (ref 98–111)
Creatinine, Ser: 0.62 mg/dL (ref 0.44–1.00)
GFR, Estimated: >60 mL/min (ref >60)
Glucose, Bld: 112 mg/dL — ABNORMAL HIGH (ref 70–99)
Potassium: 4 mmol/L (ref 3.5–5.1)
Sodium: 135 mmol/L (ref 135–145)
Total Bilirubin: 0.4 mg/dL (ref 0.0–1.2)
Total Protein: 7.2 g/dL (ref 6.5–8.1)

## 2024-03-26 LAB — CBC WITH DIFFERENTIAL/PLATELET
Abs Immature Granulocytes: 0.02 10*3/uL (ref 0.00–0.07)
Basophils Absolute: 0.1 10*3/uL (ref 0.0–0.1)
Basophils Relative: 1 %
Eosinophils Absolute: 0.3 10*3/uL (ref 0.0–0.5)
Eosinophils Relative: 4 %
HCT: 31.8 % — ABNORMAL LOW (ref 36.0–46.0)
Hemoglobin: 9.6 g/dL — ABNORMAL LOW (ref 12.0–15.0)
Immature Granulocytes: 0 %
Lymphocytes Relative: 34 %
Lymphs Abs: 2.7 10*3/uL (ref 0.7–4.0)
MCH: 24.2 pg — ABNORMAL LOW (ref 26.0–34.0)
MCHC: 30.2 g/dL (ref 30.0–36.0)
MCV: 80.1 fL (ref 80.0–100.0)
Monocytes Absolute: 0.6 10*3/uL (ref 0.1–1.0)
Monocytes Relative: 8 %
Neutro Abs: 4.2 10*3/uL (ref 1.7–7.7)
Neutrophils Relative %: 53 %
Platelets: 337 10*3/uL (ref 150–400)
RBC: 3.97 MIL/uL (ref 3.87–5.11)
RDW: 16.9 % — ABNORMAL HIGH (ref 11.5–15.5)
WBC: 7.8 10*3/uL (ref 4.0–10.5)
nRBC: 0 % (ref 0.0–0.2)

## 2024-03-26 MED ORDER — DICLOFENAC SODIUM 50 MG PO TBEC: 50.0000 mg | DELAYED_RELEASE_TABLET | Freq: Two times a day (BID) | ORAL | 0 refills | Status: AC | PRN

## 2024-03-26 NOTE — ED Notes (Signed)
 As patient was leaving she asked if eating raw cornstarch was bad for you. I informed her the eating cornstarch raw is not recommended as it is difficult to digest and has a chalky texture and may be difficult to swallow. I also told her that it could elevate her glucose level as it acts as a carbohydrate.

## 2024-03-26 NOTE — Discharge Instructions (Addendum)
 Lymphadenopathy: This means swollen glands.  Some swollen glands noted in the neck.  None noted under the arms or in the pelvis.  Will provide a nonsteroidal anti-inflammatory for the discomfort.  Chemistry panel and blood count drawn to help work up this concern.  Needs to see family practice because she will probably need additional workup.  Regarding her left lower leg pain she has some very small 2 to 3 mm nodules between her mid shin and her ankle.  I think the area she is feeling are actually nodular veins or varicosities.  She is going to be on diclofenac and hopefully that will help.  Again this is a problem that she should see family practice about if it does not improve.

## 2024-03-26 NOTE — ED Provider Notes (Signed)
 [facility]    CSN: [id number] Arrival date & time: 03/26/24  1533      History   Chief Complaint Chief Complaint  Patient presents with   Leg Pain   knot on neck     HPI Maureen Simpson is a 48 y.o. female.   Patient reports that she has persistent and intermittent nodules in her neck.  These nodules hurt when she can feel them.  Over-the-counter medication has not helped the pain.  She reports that she has pain sometimes daily.  She was worked up a year ago and told that it was, her blood work was normal and that the nodules would go away.  The nodules did go away but they continue to recur.  She also now has at least 1 or 2 very tiny nodules along her tibial bone in her left lower leg between the mid calf and the ankle.  These are tender to.  Also sometimes she has numbness or tingling in her left lower leg.   Leg Pain Associated symptoms: no back pain and no fever     Past Medical History:  Diagnosis Date   Abnormal Pap smear    during preg   Asthma    Chronic hypertension 06/17/2016   Lipoma    right forearm   No pertinent past medical history    Seborrheic dermatitis of scalp 12/16/2022   UTI (lower urinary tract infection)    Vaginal Pap smear, abnormal     Patient Active Problem List   Diagnosis Date Noted   Pruritic dermatosis of scalp 12/16/2022   Anxiety 07/17/2022   Stressful life event affecting family 07/17/2022   Hypocalcemia 07/17/2022   Tobacco use 07/17/2022   Menorrhagia with regular cycle 07/17/2022   Chest discomfort 07/17/2022   Elevated glucose 07/17/2022   Essential hypertension, benign 07/17/2022   Anemia 07/17/2022   S/P tubal ligation 06/21/2016   Chronic hypertension 06/17/2016   Hypertension, postpartum condition or complication 12/18/2013   Abnormal Pap smear of cervix 05/05/2013    Past Surgical History:  Procedure Laterality Date   COLPOSCOPY     LIPOMA EXCISION  05/26/2012   Procedure: EXCISION LIPOMA;   Surgeon: Atilano Ina, MD,FACS;  Location: [facility];  Service: General;  Laterality: Right;  excision of right proximal forearm lipoma   TUBAL LIGATION N/A 06/20/2016   Procedure: POST PARTUM TUBAL LIGATION;  Surgeon: Catalina Antigua, MD;  Location: [facility];  Service: Gynecology;  Laterality: N/A;    OB History     Gravida  2   Para  2   Term  2   Preterm      AB      Living  2      SAB      IAB      Ectopic      Multiple  0   Live Births  2            Home Medications    Prior to Admission medications   Medication Sig Start Date End Date Taking? Authorizing Provider  diclofenac (VOLTAREN) 50 MG EC tablet Take 1 tablet (50 mg total) by mouth 2 (two) times daily as needed for up to 15 days (take after or with food for persistent neck or leg pain). 03/26/24 04/10/24 Yes Prescilla Sours, FNP    Family History Family History  Problem Relation Age of Onset   Hypertension Mother    Hypertension Maternal Aunt  Cancer Maternal Grandmother        colon     Social History Social History   Tobacco Use   Smoking status: Former    Current packs/day: 0.25    Types: Cigarettes   Smokeless tobacco: Former    Quit date: 12/08/2015  Substance Use Topics   Alcohol use: Yes    Comment: SOCIAL   Drug use: No     Allergies   Pollen extract   Review of Systems Review of Systems  Constitutional:  Negative for chills and fever.  HENT:  Negative for ear pain and sore throat.   Eyes:  Negative for pain and visual disturbance.  Respiratory:  Negative for cough and shortness of breath.   Cardiovascular:  Negative for chest pain and palpitations.  Gastrointestinal:  Negative for abdominal pain, constipation, diarrhea, nausea and vomiting.  Genitourinary:  Negative for dysuria and hematuria.  Musculoskeletal:  Positive for arthralgias (left lower leg). Negative for back pain.  Skin:  Negative for color change and rash.  Neurological:  Negative for  seizures and syncope.  Hematological:  Positive for adenopathy.  All other systems reviewed and are negative.    Physical Exam Triage Vital Signs ED Triage Vitals  Encounter Vitals Group     BP 03/26/24 1647 139/86     Systolic BP Percentile --      Diastolic BP Percentile --      Pulse Rate 03/26/24 1647 69     Resp 03/26/24 1647 15     Temp 03/26/24 1647 98.4 F (36.9 C)     Temp Source 03/26/24 1647 Oral     SpO2 03/26/24 1647 97 %     Weight --      Height --      Head Circumference --      Peak Flow --      Pain Score 03/26/24 1644 8     Pain Loc --      Pain Education --      Exclude from Growth Chart --    No data found.  Updated Vital Signs BP 139/86 (BP Location: Right Arm)   Pulse 69   Temp 98.4 F (36.9 C) (Oral)   Resp 15   LMP 02/25/2024 (Approximate)   SpO2 97%   Visual Acuity Right Eye Distance:   Left Eye Distance:   Bilateral Distance:    Right Eye Near:   Left Eye Near:    Bilateral Near:     Physical Exam Vitals and nursing note reviewed.  Constitutional:      General: She is not in acute distress.    Appearance: She is well-developed. She is not ill-appearing or toxic-appearing.  HENT:     Head: Normocephalic and atraumatic.     Right Ear: Hearing, tympanic membrane, ear canal and external ear normal.     Left Ear: Hearing, tympanic membrane, ear canal and external ear normal.     Nose: No congestion or rhinorrhea.     Right Sinus: No maxillary sinus tenderness or frontal sinus tenderness.     Left Sinus: No maxillary sinus tenderness or frontal sinus tenderness.     Mouth/Throat:     Lips: Pink.     Mouth: Mucous membranes are moist.     Pharynx: Uvula midline. No oropharyngeal exudate or posterior oropharyngeal erythema.     Tonsils: No tonsillar exudate.  Eyes:     Conjunctiva/sclera: Conjunctivae normal.     Pupils: Pupils are equal, round, and reactive  to light.  Cardiovascular:     Rate and Rhythm: Normal rate and regular  rhythm.     Pulses:          Dorsalis pedis pulses are 2+ on the right side and 2+ on the left side.       Posterior tibial pulses are 2+ on the right side and 2+ on the left side.     Heart sounds: S1 normal and S2 normal. No murmur heard. Pulmonary:     Effort: Pulmonary effort is normal. No respiratory distress.     Breath sounds: Normal breath sounds. No decreased breath sounds, wheezing, rhonchi or rales.  Abdominal:     General: Bowel sounds are normal.     Palpations: Abdomen is soft.     Tenderness: There is no abdominal tenderness.  Musculoskeletal:        General: No swelling.     Cervical back: Neck supple.     Right lower leg: Normal.     Left lower leg: Tenderness (There are 2 very small nodular densities less than 2 mm in size along the tibial ridge approximately 4 inches above the left ankle.  These seem to be varicosities or varicose valves.) present. No swelling. No edema.  Feet:     Right foot:     Skin integrity: Skin integrity normal.     Left foot:     Skin integrity: Skin integrity normal.     Comments: Patient had great sensation on her toes and the plantar surface of both feet. Lymphadenopathy:     Head:     Right side of head: No submental, submandibular (small, mobile, only mildly tender), tonsillar, preauricular or posterior auricular adenopathy.     Left side of head: No submental, submandibular (small, mobile, only mildly tender), tonsillar, preauricular or posterior auricular adenopathy.     Cervical: Cervical adenopathy present.     Right cervical: Superficial cervical adenopathy (small, mobile, only mildly tender) present.     Left cervical: Superficial cervical adenopathy (small, mobile, only mildly tender) present.  Skin:    General: Skin is warm and dry.     Capillary Refill: Capillary refill takes less than 2 seconds.     Findings: No rash.  Neurological:     Mental Status: She is alert and oriented to person, place, and time.  Psychiatric:         Mood and Affect: Mood normal.      UC Treatments / Results  Labs (all labs ordered are listed, but only abnormal results are displayed) Labs Reviewed  CBC WITH DIFFERENTIAL/PLATELET  COMPREHENSIVE METABOLIC PANEL WITH GFR    EKG   Radiology No results found.  Procedures Procedures (including critical care time)  Medications Ordered in UC Medications - No data to display  Initial Impression / Assessment and Plan / UC Course  I have reviewed the triage vital signs and the nursing notes.  Pertinent labs & imaging results that were available during my care of the patient were reviewed by me and considered in my medical decision making (see chart for details).     Plan of Care: Lymphadenopathy: Patient's nodules in her neck appear to be lymph nodes that are intermittently getting inflamed and tender.  Will treat with diclofenac, 50 mg every 12 hours with food if needed for pain.  CBC and CMP are pending.  If she continues to have intermittent lymphadenopathy, she needs to see her family practice or even ear nose and throat she  may need biopsies of these areas. Left lower leg pain: I believe the nodules the patient's are concerned about her her valves or varicose vein valves.  Perhaps the diclofenac will help this.  Otherwise this does not need intervention.  She continues with tingling and pain in her legs she needs further workup by primary care for possible neuropathy. Final Clinical Impressions(s) / UC Diagnoses   Final diagnoses:  Lymphadenopathy of head and neck region  Pain of left lower leg     Discharge Instructions      Lymphadenopathy: This means swollen glands.  Some swollen glands noted in the neck.  None noted under the arms or in the pelvis.  Will provide a nonsteroidal anti-inflammatory for the discomfort.  Chemistry panel and blood count drawn to help work up this concern.  Needs to see family practice because she will probably need additional  workup.  Regarding her left lower leg pain she has some very small 2 to 3 mm nodules between her mid shin and her ankle.  I think the area she is feeling are actually nodular veins or varicosities.  She is going to be on diclofenac and hopefully that will help.  Again this is a problem that she should see family practice about if it does not improve.     ED Prescriptions     Medication Sig Dispense Auth. Provider   diclofenac (VOLTAREN) 50 MG EC tablet Take 1 tablet (50 mg total) by mouth 2 (two) times daily as needed for up to 15 days (take after or with food for persistent neck or leg pain). 30 tablet Prescilla Sours, FNP      PDMP not reviewed this encounter.   Prescilla Sours, FNP 03/26/24 1723

## 2024-03-26 NOTE — ED Triage Notes (Signed)
 Year ago had knot on neck, saw a doctor for it. Ran some test that were all negative and would go away. Reports that had pain ever since.  Left lower anterior leg started hurting for a month. Reports "mild sting" that is intermittent. Takes OTC medication that doesn't help. Denies weight bearing making pain worse.  Doesn't have a PCP

## 2024-08-15 ENCOUNTER — Encounter (HOSPITAL_COMMUNITY): Payer: Self-pay | Admitting: *Deleted

## 2024-08-15 ENCOUNTER — Ambulatory Visit (HOSPITAL_COMMUNITY)
Admission: EM | Admit: 2024-08-15 | Discharge: 2024-08-15 | Disposition: A | Discharge status: Home / Self Care | Attending: Emergency Medicine | Admitting: Emergency Medicine

## 2024-08-15 DIAGNOSIS — U071 COVID-19: Secondary | ICD-10-CM

## 2024-08-15 DIAGNOSIS — J069 Acute upper respiratory infection, unspecified: Secondary | ICD-10-CM

## 2024-08-15 LAB — POC SARS CORONAVIRUS 2 AG -  ED: SARS Coronavirus 2 Ag: POSITIVE — AB

## 2024-08-15 NOTE — Discharge Instructions (Signed)
 Your covid test is positive. The recommendations suggest returning to normal activities when, for at least 24 hours, symptoms are improving overall, and if a fever was present, it has been gone without use of a fever-reducing medication.  Once people resume normal activities, they are encouraged to take additional prevention strategies for the next 5 days to curb disease spread, such as taking more steps for cleaner air, enhancing hygiene practices, wearing a well-fitting mask, keeping a distance from others, and/or getting tested for respiratory viruses

## 2024-08-15 NOTE — ED Triage Notes (Signed)
 Pt states that yesterday she started having sore throat, chills, fatigue, and congestion. She hasn't taken any meds for sx.

## 2024-08-15 NOTE — ED Provider Notes (Signed)
 [facility]    CSN: [id number] Arrival date & time: 08/15/24  1328      History   Chief Complaint Chief Complaint  Patient presents with   Fatigue   Chills   Sore Throat   Letter for School/Work    HPI Maureen Simpson is a 48 y.o. female.   48 year old female, Maureen Simpson, presents to urgent care for evaluation of sore throat, chills, fatigue and congestion that started yesterday.  Patient has not taken any medicine for symptoms.   feels like when I had COVID  Past medical history asthma, hypertension  The history is provided by the patient. No language interpreter was used.    Past Medical History:  Diagnosis Date   Abnormal Pap smear    during preg   Asthma    Chronic hypertension 06/17/2016   Lipoma    right forearm   No pertinent past medical history    Seborrheic dermatitis of scalp 12/16/2022   UTI (lower urinary tract infection)    Vaginal Pap smear, abnormal     Patient Active Problem List   Diagnosis Date Noted   Viral URI 08/15/2024   COVID 08/15/2024   Pruritic dermatosis of scalp 12/16/2022   Anxiety 07/17/2022   Stressful life event affecting family 07/17/2022   Hypocalcemia 07/17/2022   Tobacco use 07/17/2022   Menorrhagia with regular cycle 07/17/2022   Chest discomfort 07/17/2022   Elevated glucose 07/17/2022   Essential hypertension, benign 07/17/2022   Anemia 07/17/2022   S/P tubal ligation 06/21/2016   Chronic hypertension 06/17/2016   Hypertension, postpartum condition or complication 12/18/2013   Abnormal Pap smear of cervix 05/05/2013    Past Surgical History:  Procedure Laterality Date   COLPOSCOPY     LIPOMA EXCISION  05/26/2012   Procedure: EXCISION LIPOMA;  Surgeon: Camellia CHRISTELLA Blush, MD,FACS;  Location: [facility];  Service: General;  Laterality: Right;  excision of right proximal forearm lipoma   TUBAL LIGATION N/A 06/20/2016   Procedure: POST PARTUM TUBAL LIGATION;  Surgeon: Winton Felt,  MD;  Location: [facility];  Service: Gynecology;  Laterality: N/A;    OB History     Gravida  2   Para  2   Term  2   Preterm      AB      Living  2      SAB      IAB      Ectopic      Multiple  0   Live Births  2            Home Medications    Prior to Admission medications   Not on File    Family History Family History  Problem Relation Age of Onset   Hypertension Mother    Hypertension Maternal Aunt    Cancer Maternal Grandmother        colon     Social History Social History   Tobacco Use   Smoking status: Former    Current packs/day: 0.25    Types: Cigarettes   Smokeless tobacco: Former    Quit date: 12/08/2015  Vaping Use   Vaping status: Never Used  Substance Use Topics   Alcohol use: Yes    Comment: SOCIAL   Drug use: No     Allergies   Pollen extract   Review of Systems Review of Systems  Constitutional:  Positive for chills and fatigue.  HENT:  Positive for congestion and sore  throat.   All other systems reviewed and are negative.    Physical Exam Triage Vital Signs ED Triage Vitals  Encounter Vitals Group     BP 08/15/24 1403 (!) 139/97     Girls Systolic BP Percentile --      Girls Diastolic BP Percentile --      Boys Systolic BP Percentile --      Boys Diastolic BP Percentile --      Pulse Rate 08/15/24 1403 78     Resp 08/15/24 1403 18     Temp 08/15/24 1403 98.6 F (37 C)     Temp Source 08/15/24 1403 Oral     SpO2 08/15/24 1403 96 %     Weight --      Height --      Head Circumference --      Peak Flow --      Pain Score 08/15/24 1359 2     Pain Loc --      Pain Education --      Exclude from Growth Chart --    No data found.  Updated Vital Signs BP (!) 139/97 (BP Location: Left Arm)   Pulse 78   Temp 98.6 F (37 C) (Oral)   Resp 18   LMP 07/19/2024 (Approximate)   SpO2 96%   Visual Acuity Right Eye Distance:   Left Eye Distance:   Bilateral Distance:    Right Eye Near:   Left Eye  Near:    Bilateral Near:     Physical Exam Vitals and nursing note reviewed.  Constitutional:      Appearance: She is well-developed and well-groomed.  HENT:     Head: Normocephalic.     Right Ear: Tympanic membrane is retracted.     Left Ear: Tympanic membrane is retracted.     Nose: Congestion present.     Mouth/Throat:     Lips: Pink.     Mouth: Mucous membranes are moist.     Pharynx: Uvula midline.  Cardiovascular:     Rate and Rhythm: Normal rate and regular rhythm.     Heart sounds: Normal heart sounds.  Pulmonary:     Effort: Pulmonary effort is normal.     Breath sounds: Normal breath sounds and air entry.     Comments: 96% on RA, R 18 unlabored Neurological:     General: No focal deficit present.     Mental Status: She is alert and oriented to person, place, and time.     GCS: GCS eye subscore is 4. GCS verbal subscore is 5. GCS motor subscore is 6.  Psychiatric:        Attention and Perception: Attention normal.        Mood and Affect: Mood normal.        Speech: Speech normal.        Behavior: Behavior normal. Behavior is cooperative.      UC Treatments / Results  Labs (all labs ordered are listed, but only abnormal results are displayed) Labs Reviewed  POC SARS CORONAVIRUS 2 AG -  ED - Abnormal; Notable for the following components:      Result Value   SARS Coronavirus 2 Ag Positive (*)    All other components within normal limits    EKG   Radiology No results found.  Procedures Procedures (including critical care time)  Medications Ordered in UC Medications - No data to display  Initial Impression / Assessment and Plan / UC Course  I have reviewed the triage vital signs and the nursing notes.  Pertinent labs & imaging results that were available during my care of the patient were reviewed by me and considered in my medical decision making (see chart for details).    Discussed exam findings and plan of care with patient, strict go to ER  precautions given.   Patient verbalized understanding to this provider.  Ddx: Covid, Viral URI, allergies Final Clinical Impressions(s) / UC Diagnoses   Final diagnoses:  Viral URI  COVID     Discharge Instructions      Your covid test is positive. The recommendations suggest returning to normal activities when, for at least 24 hours, symptoms are improving overall, and if a fever was present, it has been gone without use of a fever-reducing medication.  Once people resume normal activities, they are encouraged to take additional prevention strategies for the next 5 days to curb disease spread, such as taking more steps for cleaner air, enhancing hygiene practices, wearing a well-fitting mask, keeping a distance from others, and/or getting tested for respiratory viruses     ED Prescriptions   None    PDMP not reviewed this encounter.   Aminta Loose, NP 08/15/24 [phone]
